# Patient Record
Sex: Male | Born: 1935 | Race: White | Hispanic: No | Marital: Married | Smoking: Former smoker
Health system: Southern US, Community
[De-identification: ages and names within clinical notes are randomized; demographics above are authoritative.]

## PROBLEM LIST (undated history)

## (undated) DIAGNOSIS — R972 Elevated prostate specific antigen [PSA]: Secondary | ICD-10-CM

## (undated) DIAGNOSIS — K219 Gastro-esophageal reflux disease without esophagitis: Secondary | ICD-10-CM

## (undated) DIAGNOSIS — J939 Pneumothorax, unspecified: Secondary | ICD-10-CM

## (undated) DIAGNOSIS — IMO0001 Reserved for inherently not codable concepts without codable children: Secondary | ICD-10-CM

## (undated) DIAGNOSIS — I10 Essential (primary) hypertension: Secondary | ICD-10-CM

## (undated) DIAGNOSIS — R55 Syncope and collapse: Secondary | ICD-10-CM

## (undated) DIAGNOSIS — N400 Enlarged prostate without lower urinary tract symptoms: Secondary | ICD-10-CM

## (undated) DIAGNOSIS — E785 Hyperlipidemia, unspecified: Secondary | ICD-10-CM

## (undated) DIAGNOSIS — K635 Polyp of colon: Secondary | ICD-10-CM

## (undated) DIAGNOSIS — Z950 Presence of cardiac pacemaker: Secondary | ICD-10-CM

## (undated) DIAGNOSIS — I2510 Atherosclerotic heart disease of native coronary artery without angina pectoris: Secondary | ICD-10-CM

## (undated) DIAGNOSIS — H3320 Serous retinal detachment, unspecified eye: Secondary | ICD-10-CM

## (undated) HISTORY — DX: Gastro-esophageal reflux disease without esophagitis: K21.9

## (undated) HISTORY — PX: CARDIAC SURGERY: SHX584

## (undated) HISTORY — DX: Elevated prostate specific antigen (PSA): R97.20

## (undated) HISTORY — DX: Benign prostatic hyperplasia without lower urinary tract symptoms: N40.0

## (undated) HISTORY — DX: Reserved for inherently not codable concepts without codable children: IMO0001

## (undated) HISTORY — PX: HEMORROIDECTOMY: SUR656

## (undated) HISTORY — DX: Polyp of colon: K63.5

## (undated) HISTORY — DX: Serous retinal detachment, unspecified eye: H33.20

## (undated) HISTORY — DX: Hyperlipidemia, unspecified: E78.5

## (undated) HISTORY — DX: Pneumothorax, unspecified: J93.9

## (undated) HISTORY — DX: Syncope and collapse: R55

---

## 2001-11-19 ENCOUNTER — Ambulatory Visit (HOSPITAL_COMMUNITY): Admission: RE | Admit: 2001-11-19 | Discharge: 2001-11-19 | Payer: Self-pay | Admitting: Internal Medicine

## 2002-07-08 HISTORY — PX: EYE SURGERY: SHX253

## 2007-04-08 HISTORY — PX: BYPASS GRAFT: SHX909

## 2007-04-15 ENCOUNTER — Ambulatory Visit (HOSPITAL_COMMUNITY): Admission: RE | Admit: 2007-04-15 | Discharge: 2007-04-15 | Payer: Self-pay | Admitting: Family Medicine

## 2007-04-16 ENCOUNTER — Ambulatory Visit (HOSPITAL_COMMUNITY): Admission: RE | Admit: 2007-04-16 | Discharge: 2007-04-16 | Payer: Self-pay | Admitting: Cardiovascular Disease

## 2007-04-16 ENCOUNTER — Ambulatory Visit: Payer: Self-pay | Admitting: Cardiovascular Disease

## 2007-04-17 ENCOUNTER — Ambulatory Visit: Payer: Self-pay | Admitting: Cardiology

## 2007-04-17 ENCOUNTER — Inpatient Hospital Stay (HOSPITAL_BASED_OUTPATIENT_CLINIC_OR_DEPARTMENT_OTHER): Admission: RE | Admit: 2007-04-17 | Discharge: 2007-04-17 | Payer: Self-pay | Admitting: Cardiology

## 2007-04-17 ENCOUNTER — Encounter: Payer: Self-pay | Admitting: Surgery

## 2007-04-17 ENCOUNTER — Inpatient Hospital Stay (HOSPITAL_COMMUNITY): Admission: AD | Admit: 2007-04-17 | Discharge: 2007-04-25 | Payer: Self-pay | Admitting: Cardiology

## 2007-04-20 ENCOUNTER — Ambulatory Visit: Payer: Self-pay | Admitting: Surgery

## 2007-05-07 ENCOUNTER — Encounter: Admission: RE | Admit: 2007-05-07 | Discharge: 2007-05-07 | Payer: Self-pay | Admitting: Surgery

## 2007-05-07 ENCOUNTER — Ambulatory Visit: Payer: Self-pay | Admitting: Surgery

## 2007-05-15 ENCOUNTER — Ambulatory Visit: Payer: Self-pay | Admitting: Cardiovascular Disease

## 2007-06-15 ENCOUNTER — Encounter: Payer: Self-pay | Admitting: Emergency Medicine

## 2007-06-15 ENCOUNTER — Observation Stay (HOSPITAL_COMMUNITY): Admission: AD | Admit: 2007-06-15 | Discharge: 2007-06-16 | Payer: Self-pay | Admitting: Cardiology

## 2007-06-15 ENCOUNTER — Ambulatory Visit: Payer: Self-pay | Admitting: Cardiology

## 2007-10-14 HISTORY — PX: US ECHOCARDIOGRAPHY: HXRAD669

## 2010-07-08 HISTORY — PX: SKIN GRAFT: SHX250

## 2010-11-05 HISTORY — PX: NM MYOVIEW LTD: HXRAD82

## 2010-11-20 NOTE — Consult Note (Signed)
NAME:  Chad Martinez, Chad Martinez NO.:  [id number]   MEDICAL RECORD NO.:  [id number]          PATIENT TYPE:  INP   LOCATION:  2003                         FACILITY:  [facility]   PHYSICIAN:  Evelene Croon, M.D.     DATE OF BIRTH:  Mar 06, 1935   DATE OF CONSULTATION:  04/18/2007  DATE OF DISCHARGE:                                 CONSULTATION   TYPE OF CONSULT:  Cardiovascular surgery.   REFERRING PHYSICIAN:  Arturo Morton. Riley Kill, MD, FAC   REASON FOR CONSULTATION:  Severe 3-vessel coronary artery disease.   CLINICAL HISTORY:  I was asked by Dr. Bonnee Quin this gentleman for  consideration of coronary artery bypass graft surgery.  He is a 76-year-  old fairly healthy gentleman who reports a 30 to 40-year history of  having intermittent substernal chest pain that he has always felt was GI  in nature.  He belches a lot and the pain goes away.  It has been more  related to meals.  Over the past 1 to 2 months, he has been having pain  in his left upper back over the shoulder blade occurring with exertion.  He underwent a stress test on April 15, 2007, by Dr. Simone Curia and  developed chest pressure as well as electrocardiogram changes in the  inferolateral leads.  He subsequently underwent cardiac catheterization  yesterday which shows severe 3-vessel disease.  The LAD had 30 to 40%  proximal stenosis and a 70% mid-vessel stenosis.  There was a second  diagonal branch that had a 50% proximal stenosis.  The left circumflex  had 70% proximal stenosis and then complete occlusion with filling of 2  obtuse marginal branches by collaterals.  The right coronary artery  had  a 60% proximal and 80% distal stenosis at the take off of the posterior  descending branch.  There was about 80% stenosis in a posterolateral  branch.  Left ventricular ejection fraction was normal.  End-diastolic  pressure was 12.  There was no gradient across the aortic valve.   REVIEW OF SYSTEMS:  GENERAL:  He  denies any fever or chills.  He has had  no recent weight changes.  He is not fatigued.  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  He has had surgery on both eyes.  ENT:  Negative.  ENDOCRINE:  He denies diabetes and hypothyroidism.  CARDIOVASCULAR:  As  above.  He has had exertional pain in his left upper back as well as  some substernal chest pain which he has had for years.  He denies  shortness of breath.  He denies PND and orthopnea.  He has had no  peripheral edema.  Denies palpitations.  RESPIRATORY:  He denies cough  and sputum production.  GI:  He has had no nausea or vomiting.  Does  report reflux-type symptoms.  He has had no melena or bright red blood  per rectum.  GU:  He denies dysuria and hematuria.  He does have  enlarged prostate.  NEUROLOGIC:  He denies any focal weakness or  numbness.  He denies dizziness and syncope.  He has never had TIA or  stroke.  MUSCULOSKELETAL:  He does have arthritis.  HEMATOLOGIC:  He  denies any hit of bleeding disorders or easy bleeding.  PSYCHIATRIC:  Negative.   ALLERGIES:  NONE.   MEDICATIONS:  His only medication prior to admission was enalapril 10 mg  daily.   SOCIAL HISTORY:  He is married for 40 years and is retired.  He spends  the winter in [place] and drives a mobile home down there with his wife.  He has 4 children.  He dinks occasional alcohol and denies smoking.   FAMILY HISTORY:  His father died in his 71s from multiple sclerosis.  Mother died at age 31.  There is no known family history of coronary  artery disease.   PAST MEDICAL HISTORY:  1. Significant for hypertension.  2. 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  3. He has a history of benign prostatic hypertrophy.  He has had prior      prostate biopsy.  4. He has status post hemorrhoid surgery.   PHYSICAL EXAMINATION:  VITAL SIGNS:  His blood  pressure 138/76, pulse 74  and regular, respiratory rate 20 unlabored, his weight is 199 pounds and  his height is 5 feet 8 inches.  GENERAL:  He is a well-developed white male in no distress.  HEENT:  Shows to be normocephalic and atraumatic.  Pupils equal and  reactive to light and accommodation.  Extraocular muscles intact.  His  throat is clear.  NECK:  Shows normal carotid pulses bilaterally. There are no bruits.  There is no  adenopathy or thyromegaly.  CARDIAC:  Shows a regular rate and rhythm with normal S1 and S2.  There  is no murmur, rub or gallop.  LUNGS:  Clear.  ABDOMEN:  Shows active bowel sounds.  His abdomen is soft, mildly obese  and nontender.  There are no palpable masses or organomegaly.  EXTREMITIES:  Shows no peripheral edema.  Pedal pulses are palpated  bilaterally.  SKIN:  Warm and dry.  NEUROLOGIC:  Shows him to be alert and oriented times 3.  Motor and  sensory exam is grossly normal.   IMAGING:  Carotid Doppler examination shows no evidence of internal  carotid artery stenosis.  His upper extremity arterial Dopplers are  normal.   IMPRESSION:  Mr. Hamberger has severe 3-vessel coronary artery disease  with anginal symptoms and a positive stress test.  I agree that coronary  artery bypass graft surgery is the best treatment to prevent further  ischemia and infarction and to improve his symptoms.  I discussed the  operative procedure with the patient and his wife, including  alternatives, benefits and risks including but not limited to bleeding,  blood transfusion, infection, stroke, myocardial infarction, graft  failure, and death.  They understand and agree to proceed.  We will plan  to do this on Monday morning April 20, 2007.      Evelene Croon, M.D.  Electronically Signed     BB/MEDQ  D:  04/18/2007  T:  04/19/2007  Job:  [id number]   cc:   Arturo Morton. Riley Kill, MD, [facility]  Donna Bernard, M.D.

## 2010-11-20 NOTE — Discharge Summary (Signed)
NAME:  Chad Martinez, Chad Martinez                ACCOUNT NO.:  [id number]   MEDICAL RECORD NO.:  [id number]          PATIENT TYPE:  INP   LOCATION:  3733                         FACILITY:  [facility]   PHYSICIAN:  Peter C. Eden Emms, MD, FACCDATE OF BIRTH:  Feb 22, 1935   DATE OF ADMISSION:  06/15/2007  DATE OF DISCHARGE:  06/16/2007                               DISCHARGE SUMMARY   PROCEDURES PERFORMED:  None.   PRIMARY FINAL DISCHARGE DIAGNOSIS:  Chest/neck pain.   SECONDARY DIAGNOSES:  1. Status post aortocoronary bypass surgery on April 20, 2007, with      left internal mammary artery to left anterior descending, saphenous      vein graft to obtuse marginal-2/obtuse marginal-3, saphenous vein      graft to posterior descending artery/posterolateral-      1/posterolateral-2 ( secondary to native severe 3-vessel disease).  2. History of hypertension.  3. Benign prostatic hypertrophy status post prostate biopsy.  4. 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  5. Remote history of hemorrhoid surgery.  6. Dyslipidemia.  7. History of colonoscopy and colon polyps.  8. Family history of coronary artery disease.   TIME AT DISCHARGE:  Thirty-one minutes.   HOSPITAL COURSE:  Chad Martinez is a 76 year old male who had bypass  surgery on April 20, 2007, after developing atypical chest pain.  He  had left-sided neck and shoulder pain and there was concern for cardiac  etiology to his symptoms, so he was admitted for further evaluation and  treatment.   His cardiac enzymes were negative for MI.  His blood sugar was 110 and  then a repeat blood sugar that was nonfasting was 184.  Chad Martinez  states that he checks his blood sugar occasionally with his wife's  machine at home as she is a diabetic.  He is encouraged to eat a low  sugar diet and follow up with Dr. Gerda Diss.   On June 16, 2007, Chad Martinez was evaluated by Dr. Eden Emms.  He felt  that Chad Martinez could be  followed as an outpatient and no changes in  his medications were indicated.  Chad Martinez was considered stable for  discharge on June 16, 2007, with outpatient followup arranged.   DISCHARGE INSTRUCTIONS:  His activity level is to be increased per rehab  guidelines.  He is to stick to a diet that is low in sodium and low in  sugar and carbohydrates.  He is to follow up with Dr. Eden Emms in  [place] on April 10 at 10:30 and with Dr. Gerda Diss as well and call for  an appointment.   DISCHARGE MEDICATIONS:  1. Simvastatin 80 mg daily.  2. Metoprolol 25 mg b.i.d.  3. Aspirin 325 mg daily.      Theodore Demark, PA-C      Noralyn Pick. Eden Emms, MD, [facility]  Electronically Signed    RB/MEDQ  D:  06/16/2007  T:  06/16/2007  Job:  [id number]   cc:   Dr. Gerda Diss

## 2010-11-20 NOTE — Consult Note (Signed)
NAME:  Chad Martinez, Chad Martinez NO.:  [id number]   MEDICAL RECORD NO.:  [id number]          PATIENT TYPE:  OUT   LOCATION:  DFTL                          FACILITY:  [facility]   PHYSICIAN:  Peter C. Eden Emms, MD, FACCDATE OF BIRTH:  1935-03-18   DATE OF CONSULTATION:  DATE OF DISCHARGE:                                 CONSULTATION   HISTORY:  Chad Martinez is a delightful 76 year old patient referred by  Dr. Gerda Diss for chest pain and abnormal stress test.   The patient's coronary risk factors including positive family history,  and hypertension.   The patient has had chest pain on and off for 30 years, however, over  the past 2 months or so, he has been having more exercised-induced chest  pressure.  His chronic pressure has always been thought to be GI in  nature.  He burps a lot.  It is related to meals.  He continues to think  a lot of his pressure has to do with food, however, over the last 6 to 8  weeks he clearly has exertional pain in his chest that sounds like  angina.  He had a stress test done April 15, 2007, by Dr. Simone Curia.  The patient was able to exercise for 6 minutes and 53 seconds  on the standard Bruce protocol.  He had a millimeter-and-a-half of ST  segment depression in the inferolateral leads.  More interestingly he  had significant chest pressure which reproduced his clinical symptoms  during this stress test.   The patient also had somewhat of a hypertensive response during exercise  with a systolic pressure of 220 mm.   There was no nuclear imaging done with it.   I talked to the patient at length. I explained to him that his symptoms  were suspicious for angina.  The fact that they have changed in the last  6 to 8 weeks and the fact that his stress test was positive I would feel  more comfortable with a fairly urgent heart cath.  The patient  understands this.  The risks of catheterization including stroke, dye  reaction need for emergency  surgery, bleeding and need for transfusion  were discussed.  He and his wife were willing to proceed.   The patient's past medical history is otherwise fairly benign.  He does  have a history hypertension.   He has not had previous surgery.  He has significant eye problems.  He  is essentially blind in the left eye from a detached retina and has had  the detached retina fixed in the right eye but otherwise has not really  been in the hospital.   ALLERGIES:  HE HAS NO KNOWN DRUG ALLERGIES.   MEDICATIONS:  He only takes enalapril 10 mg a day.   FAMILY HISTORY:  Remarkable for premature coronary artery disease on his  father's side.   He happily married for over 40 years.  They have 4 children.  His wife  was with him today.  He is retired from [employer].  He quit  smoking over 25  years ago.  He does not drink excessively.  He is active  in fact.  They had quit a lot of traveling planned to [facility] this  coming Sunday and then were going to spend 4 months in [place].   PHYSICAL EXAMINATION:  GENERAL:  Remarkable for a middle-aged white male  in no distress.  He has a tattoo on his left forearm.  VITAL SIGNS:  His blood pressure is 140/70, pulse 76 and regular, weight  is 199.  He is afebrile.  Respiratory rate 12.  HEENT:  Normal.  NECK:  Supple.  There is no thyromegaly, no lymphadenopathy.  No JVP  elevation. No bruits.  Lungs:  Clear with diaphragmatic motion.  No wheezing.  HEART:  There is an S1 and S2.  Normal heart sounds.  PMI is normal.  ABDOMEN:  Round.  Bowel sounds positive.  No hepatosplenomegaly.  No  hepatojugular reflux.  No AAA.  No bruit.  Femorals are 3+/3 bilaterally  without bruit.  PTs are +3.  There is no lower extremity edema.  NEUROLOGIC:  Nonfocal.  There is no muscular weakness.  SKIN:  Warm and dry.   Baseline EKG is normal.   IMPRESSION:  1. Exertional chest pain suspicious for angina, positive stress test,      left heart  catheterization with left ventriculogram by Dr. Excell Seltzer      tomorrow.  Continue aspirin therapy.  The patient has nitroglycerin      in case he needs it which was given to him by Dr. Gerda Diss.  2. Hypertension.  Continue enalapril.  May need more monitoring at      home given his hypertensive response to exercise. Adjuvant therapy      will depend on whether or not he has concomitant coronary disease.  3. The patient's LDL cholesterol is checked on April 14, 2007, was 41      with a total cholesterol of 147.  He will not need statin therapy      unless he has documented coronary disease.  4. Possible reflux and gastroesophageal reflux disease.  The patient      may benefit from a proton pump inhibitor particularly if his cath      shows no coronary artery disease.  Again we will have to wait to      see if his symptoms are related to angina.   The patient knows to go to the emergency room if he has any prolonged  episodes of pain.  Otherwise he will have his precath lab work done  today.   Note this was dictated on the hospital system so the [facility] would have  immediate record but it is an office visit at [facility] in [place].      Noralyn Pick. Eden Emms, MD, [facility]  Electronically Signed     PCN/MEDQ  D:  04/16/2007  T:  04/16/2007  Job:  [id number]   cc:   Donna Bernard, M.D.

## 2010-11-20 NOTE — Op Note (Signed)
NAME:  Chad Martinez, FAIRCLOTH NO.:  [id number]   MEDICAL RECORD NO.:  [id number]          PATIENT TYPE:  INP   LOCATION:  2310                         FACILITY:  [facility]   PHYSICIAN:  Evelene Croon, M.D.     DATE OF BIRTH:  [birth year]   DATE OF PROCEDURE:  04/20/2007  DATE OF DISCHARGE:                               OPERATIVE REPORT   PREOPERATIVE DIAGNOSIS:  Severe three-vessel coronary disease.   POSTOPERATIVE DIAGNOSIS:  Severe three-vessel coronary disease.   OPERATIVE PROCEDURE:  Median sternotomy, extracorporeal circulation,  coronary bypass graft surgery x6 using a left internal mammary artery  graft to the left anterior descending coronary, with a saphenous vein  graft to the second and third obtuse marginal branches of left  circumflex coronary, and a sequential saphenous vein graft to the  posterior descending, first posterolateral, and second posterolateral  branches of the right coronary artery.  Endoscopic vein harvesting from  the right leg.   ATTENDING SURGEON:  Evelene Croon, M.D.   ASSISTANT:  Gershon Crane, PA-C.   ANESTHESIA:  General endotracheal.   CLINICAL HISTORY:  This patient is a 76 year old gentleman who has been  previous good health who recently presented with exertional pain in his  left upper back.  He underwent a stress test which showed development of  chest pressure as well as electrocardiogram changes of inferolateral  ischemia.  He subsequently underwent cardiac catheterization on April 17, 2007 which showed severe three-vessel disease.  The LAD had 30-40%  proximal stenosis and a 70% midvessel stenosis.  There were two diagonal  branches that had 40-50% proximal stenosis.  The left circumflex had 70%  proximal stenosis and then complete occlusion with filling of 2 obtuse  marginal branches by collaterals.  The right coronary had 60% proximal  and 80% distal stenosis at the takeoff of the posterior descending  branch.  There was  about 80% stenosis in a posterolateral branch.  Left  ventricular ejection fraction was normal.  There was no gradient across  the aortic valve and no mitral regurgitation.  After review of the  angiogram and examination of the patient, it was felt that coronary  bypass graft surgery was the best treatment to prevent further ischemia  and infarction.  I discussed the operative procedure with the patient  and his wife including alternatives, benefits, and risks including but  not limited to bleeding, blood transfusion, infection, stroke,  myocardial infarction, graft failure, and death.  He understood and  agreed to proceed.   OPERATIVE PROCEDURE:  The patient was taken to the operating room and  placed on the table in supine position.  After induction of general  endotracheal anesthesia, a Foley catheter was placed in the bladder  using sterile technique.  Then, the chest, abdomen and both lower  extremities were prepped and draped in usual sterile manner.  The chest  was entered through a median sternotomy incision and the pericardium  opened in the midline.  Examination of the heart showed good ventricular  contractility.  The ascending aorta had no palpable plaques in it.  Then, the left internal mammary artery was harvested from the chest wall  as a pedicle graft.  This was a medium caliber vessel with excellent  blood pressure.  At the same time, a segment of greater saphenous vein  was harvested from the right leg using endoscopic vein harvest  technique.  This vein was of medium size and good quality.   Then, the patient was heparinized, and when an adequate activated  clotting time was achieved, the distal ascending aorta was cannulated  using a 20-French aortic cannula for arterial inflow.  Venous outflow  was achieved using a two-stage venous cannula through the right atrial  appendage.  Antegrade cardioplegia and vent cannula was inserted in the  aortic root.   The  patient was placed on cardiopulmonary bypass and distal coronary  arteries identified.  The LAD was a large graftable vessel.  There were  2 diagonal branches which had no significant distal disease in them.  There was some palpable plaque proximally.  The first marginal was small  to medium size vessel that no visible disease in it.  The second  marginal was small but graftable.  The third marginal was slightly  larger and graftable.  The right coronary artery was diffusely diseased.  There was a moderate size posterior descending, first posterolateral,  and second posterolateral branches.   Then, the aorta was crossclamped, and 500 mL of cold blood antegrade  cardioplegia was administered in the aortic root with quick arrest of  the heart.  Systemic hypothermia to 28 degrees centigrade and topical  hypothermic iced saline was used.  Temperature probe was placed in the  septum and insulating pad in the pericardium.   Then, the first distal anastomosis was performed to the posterior  descending coronary.  The internal diameter was 1.6 mm.  Conduit used  was a segment of greater saphenous vein and the anastomosis performed in  a sequential side-to-side manner using continuous 7-0 Prolene suture.  Flow was noted through the graft and was excellent.   The second distal anastomosis was performed of the first posterolateral  branch.  The internal diameter was 1.6 mm.  Conduit used was the same  segment of greater saphenous vein, and the anastomosis performed in a  sequential side-to-side manner using continuous 7-0 Prolene suture.  Flow was noted through the graft and was excellent.   The third distal anastomosis was performed to the second posterolateral  branch.  The internal diameter was also about 1.6 mm.  Conduit used was  the same segment of greater saphenous vein, and the anastomosis  performed in a sequential end-to-side manner using continuous 7-0  Prolene suture.  Then, another  dose of cardioplegia was given down vein  graft and in the aortic root.   The fourth distal anastomosis was performed to the second marginal  branch.  The internal diameter was 1.5 mm.  Conduit used was a second  segment of greater saphenous vein, and the anastomosis performed in a  sequential side-to-side manner using continuous 7-0 Prolene suture.  Flow was noted through the graft and was excellent.   The fifth distal anastomosis was performed to the third marginal branch.  The internal diameter was about 1.6 mm.  The conduit used was the same  segment of greater saphenous vein, and the anastomosis performed in a  sequential end-to-side manner using continuous 7-0 Prolene suture.  Flow  was noted through the graft and was excellent.  Then, another dose of  cardioplegia was  given down vein grafts and aortic root.   The sixth distal anastomosis performed in the distal LAD.  The internal  diameter of this vessel was about 2 mm.  Conduit used was a left  internal mammary graft, and this was brought through an opening in the  left pericardium anterior to the phrenic nerve.  It was anastomosed to  the LAD in end-to-side manner using continuous 8-0 Prolene suture.  The  pedicle was sutured to the epicardium with 6-0 Prolene sutures.  The  patient was rewarmed to 37 degrees centigrade.  Then, with a crossclamp  in place, the two proximal vein graft anastomoses were performed to the  aortic root in an end-to-side manner using continuous 6-0 Prolene  suture.  The crossclamp was then removed with a time of 93 minutes.  The  patient was rewarmed to 37 degrees centigrade.  The proximal and distal  anastomoses appeared hemostatic and lay of the grafts satisfactory.  Graft markers were placed around the proximal anastomoses.  Two  temporary right ventricular and right atrial pacing wires were placed  and brought out through the skin.   When the patient rewarmed to 37 degrees centigrade, he was  weaned from  cardiopulmonary bypass on no inotropic agents.  Total bypass time was  107 minutes.  Cardiac function appeared excellent at cardiac output of 5  liters minute.  Protamine was then given and the venous, and aortic  cannulas were removed without difficulty.  Hemostasis was achieved.  Four chest tubes were placed with bilateral pleural tubes, a tube in the  posterior pericardium and one in the anterior mediastinum.  The  pericardium was loosely reapproximated over the heart.  Sternum was  closed #6 stainless steel wires.  Fascia was closed with continuous #1  Vicryl suture.  Subcutaneous tissue was closed with continuous 2-0  Vicryl and the skin with 3-0 Vicryl subcuticular closure.  The lower  extremity vein harvest site was closed in layers in a similar manner.  The sponge, needle and instrument counts were correct according to scrub  nurse.  Dry sterile dressings were applied over the incisions and around  the chest tubes which were hooked to Pleur-Evac suction.  The patient  remained hemodynamically stable and was transferred to the SICU in  guarded but stable condition.      Evelene Croon, M.D.  Electronically Signed     BB/MEDQ  D:  04/20/2007  T:  04/21/2007  Job:  [id number]   cc:   Evelene Croon, M.D.  Arturo Morton. Riley Kill, MD, [facility]  [facility] Cardiac Catheterization [facility]

## 2010-11-20 NOTE — H&P (Signed)
NAME:  Chad Martinez, Chad Martinez NO.:  [id number]   MEDICAL RECORD NO.:  [id number]          PATIENT TYPE:  INP   LOCATION:  3733                         FACILITY:  [facility]   PHYSICIAN:  Chad Frieze. Jens Som, MD, FACCDATE OF BIRTH:  1935/03/20   DATE OF ADMISSION:  06/15/2007  DATE OF DISCHARGE:                              HISTORY & PHYSICAL   PRIMARY CARE PHYSICIAN:  Dr. Lilyan Martinez.  Cardiologist, Dr. Charlton Martinez.   CHIEF COMPLAINT:  Chest pain.   HISTORY OF PRESENT ILLNESS:  Chad Martinez is a 76 year old male with  known history of coronary artery disease.  He had bypass surgery on  April 20, 2007.  On June 13, 2007, he developed 2/10 neck pain.Marland Kitchen  His symptoms did not resolve, although they would improve with Tylenol  and he went to [facility] today.  His anginal symptoms had been  atypical with back pain and there was concern for an anginal etiology to  his pain, so he was sent to [facility] and transferred from  there to [facility].  He states his previous angina was a subscapular  back pain and this is a little bit higher.  He feels like his symptoms  are responsive to Tylenol.  He has not had chest pain.  He feels that he  is recovering well from his surgery.  He has some dyspnea on exertion  since the surgery, but he is ambulating and this is improving.  He has  not had exertional symptoms.  Currently, he is resting comfortably.   PAST MEDICAL HISTORY:  1. Status post cardiac catheterization April 17, 2007, showing      heavily calcified mid LAD, circumflex totaled and high grade RCA      lesion with a preserved EF.  2. Status post aortocoronary bypass surgery on April 20, 2007, with      LIMA to LAD, SVG to OM-2 and OM-3, SVG to PDA, PL-1 and PL-2.  3. Hypertension.  4. History of BPH, status post prostate biopsy.  5. History of detached retinas bilaterally, significantly decreased      vision on the left.  6. History of hemorrhoid  surgery.  7. Dyslipidemia with a total cholesterol of 136, triglycerides 142,      HDL 26, LDL 82 in October 6440.  8. History of colonoscopy and polyps with biopsy.  9. Family history of coronary artery disease.   SURGICAL HISTORY:  1. Cardiac catheterization.  2. Bypass surgery.  3. Colonoscopy.  4. Prostate biopsy.  5. Hemorrhoid surgery.   ALLERGIES:  No known drug allergies.   CURRENT MEDICATIONS:  1. Simvastatin 80 mg a day.  2. Metoprolol 25 mg b.i.d.  3. Aspirin 325 mg b.i.d.   SOCIAL HISTORY:  He lives in [place] with his wife and is retired  from [employer].  He quit tobacco 25 years ago with  approximately 20 pack-year history and has never abused alcohol or  drugs.  He feels that he has been trying to increase his activity since  the surgery and doing pretty well with this.  FAMILY HISTORY:  His father died of an MI at age 89.  His mother died of  pneumonia at age 33.  He has one brother who died of lung cancer and one  sister who is healthy and neither one of his siblings had coronary  artery disease.   REVIEW OF SYSTEMS:  He has no hematemesis, hemoptysis or melena.  He  denies any GI symptoms.  He has had other arthralgias in the past, but  none recently.  He denies chest pain, shortness of breath, dyspnea on  exertion, orthopnea, PND, edema or palpitations.  He has had no syncope  or presyncope.  He has had no fevers or chills or sweats.  Full 14-point  review of systems is otherwise negative.   PHYSICAL EXAMINATION:  VITAL SIGNS:  Blood pressure 139/86, temperature  96.7, pulse 69, respiratory 20, O2 saturation 99% on room air.  GENERAL:  He is a well-developed, well-nourished, white male in no acute  distress.  HEENT::  Normal.  NECK:  There is no JVD no lymphadenopathy, no thyromegaly and no carotid  bruits are noted.  CARDIAC:     Heart is regular in rate and rhythm with an S1, S2, but no  significant murmur, rub or gallop is noted.   Distal pulses are intact in  all four extremities.  LUNGS:  Clear to auscultation bilaterally.  SKIN:  No rashes or lesions are noted and surgical scars are well-  healed.  ABDOMEN:  Soft and nontender with active bowel sounds.  EXTREMITIES:  There is no cyanosis, clubbing or edema noted.  MUSCULOSKELETAL:  There is no joint deformity, effusions and no spine or  CVA tenderness.  NEUROLOGIC:  He is alert and oriented.  Cranial nerves 2-12 grossly  intact.   LABORATORY DATA AND X-RAY FINDINGS:  Chest x-ray shows no acute disease  (performed at [facility]).  EKG is sinus rhythm with no acute ischemic  changes.  He has ST wave flattening in the inferior leads.  An old EKG  is pending at the time of dictation.   Hemoglobin 14.3, hematocrit 42.  Sodium 142, potassium 4.1, chloride  107, BUN 13, creatinine 1.1, glucose 110.  Point of care markers  negative x1.   IMPRESSION/PLAN:  Chad Martinez is a 76 year old male with recent bypass  surgery as well as a history of hypertension and benign prostatic  hypertrophy who has had left neck pain that is intermittent.  It is not  pleuritic and there are no associated symptoms.  It is not positional,  but improved with Tylenol and possibly with walking.  He was seen at  [facility] and transferred to [facility].  His EKG is sinus rhythm with  no acute ST changes and  initial enzymes are negative.  His symptoms are most likely  musculoskeletal.  He will be admitted to the hospital and MI will be  ruled out.  If his cardiac enzymes remain negative, he can be discharged  in the a.m. and follow up with Dr. Eden Martinez in the office.  He will be  continued on his home medications including aspirin, statin and  Lopressor.      Chad Demark, PA-C      Chad Frieze. Jens Som, MD, [facility]  Electronically Signed    Chad Martinez  D:  06/15/2007  T:  06/16/2007  Job:  [id number]   cc:   Chad Picket A. Gerda Diss, MD

## 2010-11-20 NOTE — Discharge Summary (Signed)
NAME:  Chad, Martinez NO.:  [id number]   MEDICAL RECORD NO.:  [id number]          PATIENT TYPE:  INP   LOCATION:  2006                         FACILITY:  [facility]   PHYSICIAN:  Evelene Croon, M.D.     DATE OF BIRTH:  07/24/1934   DATE OF ADMISSION:  04/17/2007  DATE OF DISCHARGE:  04/25/2007                               DISCHARGE SUMMARY   FINAL DIAGNOSIS:  Severe three vessel coronary artery disease.   IN-HOSPITAL DIAGNOSES:  1. Volume overload postoperatively.  2. Acute blood loss anemia postoperatively.   SECONDARY DIAGNOSES:  1. Hypertension.  2. 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  3. History of benign prostatic hypertrophy, status post prostate      biopsy.  4. Status post hemorrhoid surgery.   IN-HOSPITAL OPERATIONS AND PROCEDURES:  1. Cardiac catheterization.  2. Coronary artery bypass grafting x6 using a left internal mammary      artery to left anterior descending coronary, saphenous vein graft      to second and third obtuse marginal branches, sequential saphenous      vein graft to posterior descending, first posterolateral and second      posterolateral branches.  Endoscopic vein harvesting from the right      leg was done.   HISTORY AND PHYSICAL AND HOSPITAL COURSE:  The patient is a 76 year old  gentleman who has been previously in good health who recently presented  with exertional pain in his left upper back.  He underwent a stress test  which showed development of chest pressure as well as electrocardiogram  changes of inferolateral ischemia.  He subsequently underwent cardiac  catheterization on April 17, 2007, which showed severe three-vessel  coronary artery disease.  Left ventricular ejection fraction was within  normal limits.  Following catheterization Dr. Laneta Simmers was consulted.  Dr.  Laneta Simmers saw and evaluated the patient.  He discussed the patient  undergoing coronary artery bypass grafting.  Risks and  benefits were  discussed.  The patient acknowledged understanding and agreed to  proceed.  Surgery was scheduled for April 20, 2007.  Preoperatively,  the patient underwent bilateral carotid Duplex ultrasound showing no  significant ICA stenosis.  The patient remained stable preoperatively.  For details of the patient's past medical history and physical exam,  please see dictated H&P.   The patient was taken to the operating room on April 20, 2007, where  he underwent coronary artery bypass grafting x6 using a left internal  mammary artery graft to left anterior descending, saphenous vein graft  to second and third obtuse marginal branches, sequential saphenous vein  graft to posterior descending, first posterolateral and second  posterolateral branches.  Endoscopic vein harvesting from right leg was  done.  The patient tolerated this procedure well and was transferred to  the intensive care unit in stable condition.  Postoperatively, the  patient was noted to be hemodynamically stable.  He was extubated the  evening of surgery.  Post extubation, the patient noted to be alert and  oriented x4 and neurologically intact.  The patient's postoperative  course was pretty much unremarkable.  While in the intensive care unit,  vital signs were followed.  He was able to be weaned from all drips.  Swan-Ganz catheter discontinued in normal fashion.  Chest x-ray on  postop day #1 was stable.  He had minimum drainage from chest tubes and  chest tubes discontinued in normal fashion.  The patient was out of bed  ambulating well with cardiac rehab.  He was tolerating diet well.  He  did have slight volume overload and was started on diuretics.  The  patient also had mild acute blood loss anemia.  Hemoglobin and  hematocrit dropping to 8.9 and 25.5% postop day #2.  The patient was  asymptomatic.  The patient was stable and able to be transferred out to  2000 on postop day #2.  While on telemetry  floor, the patient continued  to progress well.  His vital signs remained stable.  He remained  afebrile.  He was able to be weaned off oxygen, sating greater than 90%  on room air.  Daily weights were obtained and the patient's weight was  back near baseline prior to discharge.  He was continued on diuretics  while in the hospital.  The patient remained in normal sinus rhythm  postoperatively.  His pulmonary status continued to improve.  He  continue to use his incentive spirometer.  His incisions were clean, dry  and intact and healing well.  The patient continued to progress with  ambulation.  He is tolerating diet, no nausea or vomiting noted.  External pacing wires and chest tubes insertions discontinued in normal  fashion.   The patient is tentatively ready for discharge home in the a.m. postop  day #5, April 25, 2007, pending he remains stable.   FOLLOW-UP APPOINTMENTS:  1. Follow-up appointment has been arranged with Dr. Laneta Simmers for May 05, 2007, a 2:45 p.m.  The patient will need to obtain PA and      lateral chest x-ray 30 minutes prior to this appointment.  2. The patient will need to follow up with Dr. Riley Kill in 2 weeks.  He      needs to contact Dr. Rosalyn Charters office to make this appointment.   ACTIVITY:  Patient is instructed no driving until released to do so, no  heavy lifting over 10 pounds.  He is told to ambulate 3-4 times per day,  progress as tolerated and to continue his breathing exercises.   INCISIONAL CARE:  The patient is told to shower washing his incisions  using soap and water.  He is to contact the office if he develops any  drainage or opening from any of his incision sites.   DIET:  The patient educated on diet to be low-fat, low-salt.   DISCHARGE MEDICATIONS:  1. Aspirin 325 mg daily.  2. Lopressor 25 mg b.i.d.  3. Lipitor 40 mg at night.  4. Lasix 40 mg daily x5 days.  5. Potassium chloride 20 mEq daily x5 days.  6. Oxycodone 5 mg 1-2  tablets q.4-6h. p.r.n. pain.      Chad Martinez, [name]      Evelene Croon, M.D.  Electronically Signed    KMD/MEDQ  D:  04/24/2007  T:  04/26/2007  Job:  [id number]

## 2010-11-20 NOTE — Procedures (Signed)
NAME:  Chad Martinez, Chad Martinez NO.:  [id number]   MEDICAL RECORD NO.:  [id number]          PATIENT TYPE:  OUT   LOCATION:  DFTL                          FACILITY:  [facility]   PHYSICIAN:  Donna Bernard, M.D.DATE OF BIRTH:  12/13/1934   DATE OF PROCEDURE:  04/15/2007  DATE OF DISCHARGE:                                  STRESS TEST   DATE OF TEST:  April 15, 2007.   INDICATION FOR TEST:  This patient is a 76 year old white male with a  history of multiple risk factors who presented to the office several  days ago with many concerns.  One of them was chest discomfort  substernal which usually kicks in while exercising after meals.  The  patient often has some burping and does have history of reflux.  However, does not get similar pain at any other time other than when  exercising.  Describes it as substernal pressure, no nausea, shortness  of breath, diaphoresis.   A stress test was performed in a standard Bruce protocol, resting EKG  revealed normal sinus rhythm, no significant ST-T changes.  The patient  tolerated the first stage well.  During the second stage his ST segments  dropped out markedly.  By the start of the third stage the patient was  experiencing substernal tightness.  He had a hypertensive response as  expected with systolic as high as 220.  His ST segments in II, III, and  aVF dropped out at greater than 1.5 mm.  Dropped with a nearly flat  slope.  There were also some changes in anterolateral leads.   IMPRESSION:  Positive stress test stopped on the basis of symptoms and  significant ST segment depression.  Of note during rest the segments  eventually returned to baseline.   PLAN:  Urgent cardiology consult, nitroglycerin prescribed, rationale  discussed with patient.      Donna Bernard, M.D.  Electronically Signed     WSL/MEDQ  D:  04/15/2007  T:  04/16/2007  Job:  [id number]

## 2010-11-20 NOTE — Assessment & Plan Note (Signed)
[facility]                       [place] CARDIOLOGY OFFICE NOTE   NAME:Chad Martinez, Chad Martinez                       MRN:          [id number]  DATE:05/15/2007                            DOB:          31-Dec-1934    The patient returns today for follow-up.  He is status post coronary  artery bypass graft surgery at [facility].  The patient had new  onset chest pain and heart catheterization showed three-vessel disease  with good LV function and he subsequently had bypass by Evelene Croon,  M.D.  He was discharged on October 18.  His postoperative course is  fairly unremarkable.  He has been doing well.  He is about to leave to  go to [place] for four months.   He had multiple questions.  He has seen Dr. Laneta Simmers and cleared to drive.  He was interested in generic drugs since he does not have a plan.  I  told him I would be happy to switch him from Lipitor to simvastatin as  well as generic Lopressor.  He is on an aspirin a day and no other  medications.   In general, he has been doing well.  He has not had a fever.  His  sternum is healed well.  He has a small eschar on the right leg where he  had endoscopic vein harvest site which is the last and slowest thing to  heal.   He has otherwise been active.  He has been doing aerobic activity 45  minutes a day.  We can probably get him enrolled in cardiac rehab when  he comes back from [place].  Copies of his CABG report, discharge  summary, and EKG were given.   I also told him it would be fine to have sexual intercourse with his  wife.   He has no limitations outside of mowing the lawn or other activities  that would put a lot of stress on his sternum.   REVIEW OF SYSTEMS:  Otherwise negative.   MEDICATIONS:  1. Simvastatin 80 mg a day.  2. Aspirin one a day.  3. Lopressor 25 mg b.i.d.   PHYSICAL EXAMINATION:  GENERAL:  Health appearing middle age white male  in no distress.  Affect is  appropriate.  VITAL SIGNS:  Weight is 198, blood pressure 130/70, pulse 83 and  regular, afebrile, respiratory rate 14.  HEENT:  Unremarkable.  NECK:  Supple.  LUNGS:  Clear.  Good diaphragmatic motion.  No evidence of effusion.  Sternum is well-healed.  HEART:  S1 and S2 with normal heart sounds.  PMI is normal.  ABDOMEN:  Benign.  Bowel sounds positive.  No hepatosplenomegaly.  No  hepatojugular reflux.  No tenderness.  EXTREMITIES:  Endoscopic vein harvest site on the right side has a small  eschar, but no erythema.  The left side had no harvesting.  NEUROLOGY:  Nonfocal, no muscular weakness.   EKG shows sinus rhythm with nonspecific ST-T wave changes.   IMPRESSION:  1. Stable, status post coronary artery bypass graft.  Good left      ventricular function.  Continue  aspirin and beta blocker.  2. Hyperlipidemia.  LDL cholesterol was only 88, but in light of his      bypass, I prefer to keep him on Simvastatin 80 mg a day.  3. Eschar on the right lower extremity.  He will watch this.  He will      go into a pool or any public body of water while in [place] for 2-4      weeks.  He will notify his local doctor if he has any erythema.      Currently there is no indication for antibiotics.  4. Hypertension, currently well controlled.  Continue low dose beta      blocker.  Previously on enalapril.  No need for this at this point      in time.  Overall, he has made a good recovery and I will see him      when he comes back from [place].     Noralyn Pick. Eden Emms, MD, [employer] [facility]  Electronically Signed    PCN/MedQ  DD: 05/15/2007  DT: 05/16/2007  Job #: [id number]

## 2010-11-20 NOTE — Assessment & Plan Note (Signed)
OFFICE VISIT   Chad Martinez, Chad Martinez  DOB:  07/07/1935                                        May 07, 2007  CHART #:  [id number]   Chad Martinez returned today for follow up status post coronary artery  bypass graft surgery x6 on 04/20/2007. He has been feeling well overall  and is walking daily without chest pain or shortness of breath. He is  anxious to drive his motor home to [place] for the winter.   PHYSICAL EXAMINATION:  VITAL SIGNS:  Blood pressure 114/70, pulse 88 and  regular. Respiratory rate is 16 and non-labored. Oxygen saturation on  room air is 98%.  GENERAL:  He looks well.  CARDIAC:  Regular rate and rhythm with normal heart sounds.  LUNGS:  Clear.  CHEST:  Chest incision in healing well and the sternum is stable.  EXTREMITIES:  His leg incision is healing well and there is no lower  extremity edema.   His chest x-ray shows clear lung fields and plural effusions.   MEDICATIONS:  1. Aspirin 325 mg daily.  2. Lopressor 25 mg b.i.d.  3. Lipitor 40 mg nightly.  4. Tylenol for pain.   IMPRESSION:  Overall, Chad Martinez is recovering very well following his  surgery. I encouraged him to continue walking as much as possible. I  told him that he could return to driving a car at this time, but should  refrain from lifting anything heavier than 10 pounds for a  total of 3 months since his surgery. He will continue to follow up with  Dr. Riley Kill and will contact me if he develops any problems with his  incisions.   Evelene Croon, M.D.  Electronically Signed   BB/MEDQ  D:  05/07/2007  T:  05/08/2007  Job:  [id number]

## 2010-11-20 NOTE — Cardiovascular Report (Signed)
NAME:  Chad Martinez, WANDLER NO.:  [id number]   MEDICAL RECORD NO.:  [id number]          PATIENT TYPE:  INP   LOCATION:  2003                         FACILITY:  [facility]   PHYSICIAN:  Arturo Morton. Riley Kill, MD, FACCDATE OF BIRTH:  [birth year]   DATE OF PROCEDURE:  04/17/2007  DATE OF DISCHARGE:                            CARDIAC CATHETERIZATION   INDICATIONS:  Mr. Kamel is a 76 year old gentleman who has had recent  onset of angina.  He was seen by Dr. Eden Emms after an abnormal stress  test by Dr. Gerda Diss.  Cardiac catheterization was recommended.   PROCEDURES:  1. Left heart catheterization.  2. Selective coronary arteriography.  3. Selective left ventriculography.  4. Subclavian angiography.   DESCRIPTION OF PROCEDURE:  The patient was brought to the  catheterization lab and prepped and draped in the usual fashion. Through  an anterior puncture, the femoral artery was easily entered.  A 4-French  sheath was placed.  Views of left and right coronary arteries were then  obtained in multiple angiographic projections.  Subclavian angiography  was performed. Central aortic and left ventricular pressures measured  with a pigtail. Ventriculography was then performed in the RAO  projection.  The patient tolerated the procedure without complication is  was taken to the holding area in satisfactory clinical condition.  I  reviewed the films with the patient and subsequently with the family as  well.  A CVTS consult was called.  He is to be admitted.   HEMODYNAMIC DATA.:  1. Central aortic pressure was 152/72.  2. Left ventricular pressure was 143/12.  3. No gradient on pullback across aortic valve.   ANGIOGRAPHIC DATA.:  1. Ventriculography was done in the RAO projection.  Overall systolic      function is preserved.  No segmental abnormalities or contractions      are identified.  2. The subclavian is widely patent.  3. The mid LAD is heavily calcified.  4. The left main is  free of critical disease.  5. The LAD demonstrates diffuse 70% narrowing with marked irregularity      and heavily calcified segment in the mid portion of the LAD after      the first two diagonals. The first diagonal has 40-50% narrowing      and the second diagonal probably 30-40% narrowing as well. The      distal LAD appears to be graftable.  6. The circumflex is totally occluded after a tiny second marginal.      The first marginal has minor luminal irregularity. Distally, there      are two large posterolateral branches, one that fills by retrograde      collaterals and the other one by antegrade collaterals. The vessel      is totally occluded.  7. The right coronary artery demonstrates diffuse 60% narrowing. At      the crux involving the posterior descending branch is an 80%      complex stenosis involving the takeoff of the PDA as well as the AV      right coronary itself. Beyond this, there is  70% narrowing and then      70-80% narrowing involving the first posterolateral branch which      may be a PDA itself.  The continuation portion is a very large      branch vessel that also is suitable for grafting.   CONCLUSION:  1. Well-preserved left ventricular function.  2. Patent internal mammary.  3. Total occlusion of the circumflex with large distal vessel.  4. High-grade complex disease in the right coronary artery.  5. Scattered disease of the mid left anterior descending.   PLAN:  1. The patient will be admitted.  2. Further evaluation by CVTS will be undertaken.  He will likely need      revascularizations surgery.      Arturo Morton. Riley Kill, MD, [facility]  Electronically Signed     TDS/MEDQ  D:  04/17/2007  T:  04/18/2007  Job:  [id number]   cc:   Donna Bernard, M.D.  Noralyn Pick. Eden Emms, MD, [facility]  CV Laboratory

## 2010-11-20 NOTE — Procedures (Signed)
NAME:  ARDIS, FULLWOOD NO.:  [id number]   MEDICAL RECORD NO.:  [id number]          PATIENT TYPE:  OUT   LOCATION:  RAD                           FACILITY:  [facility]   PHYSICIAN:  W. Stephen Luking, M.D.DATE OF BIRTH:  [birth year]   DATE OF PROCEDURE:  04/15/2007  DATE OF DISCHARGE:                                  STRESS TEST   INDICATION FOR TEST:  This patient is a 76 year old white male with  multiple risk factors who has somewhat atypical chest pain though  concerning for coronary ischemia of substernal discomfort which occurs  soon after eating but generally conjunctionally with exertion.   Stress test was performed at standard Bruce protocol.  Resting EKG  revealed normal sinus rhythm with no significant ST-T changes.  The  patient's blood pressure at baseline was stable 148/78 the.  First 3  minutes the patient had some progressive dyspnea with some depression of  the ST-segment.  By the completion of the third stage, the patient was  developing more significant ST depression.  At the start of the third  stage, the patient reached a heart rate of only 122.  However, at this  time he had the classic substernal pressure and discomfort associated  with dyspnea.  At this point, at 0.08 seconds past the J-point there was  a greater than 1 mm ST depression with near flattening in lead 3 and  near flattening in 2 and aVF.  There was also changes in anterolateral  leads of V5-V6, however with more of an ascending slope at 0.08 seconds  past a J-point   IMPRESSION:  Positive adequate stress test.   PLAN:  Urgent cardiology consultation rationale discussed with the  patient.      Donna Bernard, M.D.  Electronically Signed     WSL/MEDQ  D:  04/29/2007  T:  04/30/2007  Job:  [id number]

## 2010-11-23 NOTE — Op Note (Signed)
[facility]  Patient:    Chad Martinez, FLYNT Visit Number: [id number] MRN: [id number]          Service Type: END Location: DAY Attending Physician:  Jonathon Bellows Dictated by:   Roetta Sessions, M.D. Proc. Date: 11/19/01 Admit Date:  11/19/2001 Discharge Date: 11/19/2001   CC:         Loran Senters, M.D.   Operative Report  PROCEDURE:  Colonoscopy with biopsy.  INDICATIONS FOR PROCEDURE:  The patient is a 76 year old gentleman devoid of any lower GI tract symptoms referred through the courtesy of Dr. Lubertha South for colonoscopy as a screening maneuver. This approach has been discussed with Mr. Cortese. The potential risks, benefits, and alternatives have been reviewed, questions answered. Again, he has no lower GI tract symptoms. There is no family history of colorectal neoplasia and he has never had his lower GI tract imaged previously. Please see my handwritten H&P for more information.  MONITORING:  O2 saturations, blood pressure, pulse and respirations were monitored throughout the entire procedure.  CONSCIOUS SEDATION:  Versed 3mg  IV, Demerol 75 mg IV in divided doses.  INSTRUMENT:  Olympus video chip adult colonoscope.  FINDINGS:  Digital rectal examination revealed no abnormalities.  ENDOSCOPIC FINDINGS:  The prep was good.  RECTUM:  Examination of the rectal mucosa including of retroflexed view of the anal verge revealed a 5 mm sessile polyp at 3 cm in from the anal verge seen only on retroflexion. Please see photos. The remainder of the rectal mucosa appeared normal.  COLON:  The colonic mucosa was surveyed from the rectosigmoid junction through the left transverse right colon to the area of the appendiceal orifice, ileocecal valve and cecum. I attempted to photograph these areas but because the computer had "locked up" I was unable. These structures were well seen. Aside from scattered left sided diverticula, the colonic mucosa  appeared normal all the way to the cecum. From the level of the cecum and ileocecal valve, the scope was slowly withdrawn. All previously mentioned mucosal surfaces were again seen. No other abnormalities were observed.  The polyp in the rectum was cold biopsied/removed. The patient tolerated the procedure well and was reacted in endoscopy.  IMPRESSION:  1. Diminutive rectal polyp cold biopsied/removed, otherwise, normal rectum.  2. Scattered left sided diverticula. The remainder of the colonic mucosa     appeared normal.  RECOMMENDATIONS:  1. Diverticulosis literature given to Mr. Nelson. Follow-up on path.  2. Further recommendations to follow. Dictated by:   Roetta Sessions, M.D. Attending Physician:  Jonathon Bellows DD:  11/19/01 TD:  11/23/01 Job: [id number]

## 2011-04-15 LAB — I-STAT 8, (EC8 V) (CONVERTED LAB)
Bicarbonate: 25.1 — ABNORMAL HIGH
Operator id: 286941
Sodium: 142
pH, Ven: 7.402 — ABNORMAL HIGH

## 2011-04-15 LAB — POCT CARDIAC MARKERS
CKMB, poc: <1 — ABNORMAL LOW
Myoglobin, poc: 76.4

## 2011-04-15 LAB — COMPREHENSIVE METABOLIC PANEL
ALT: 19
Alkaline Phosphatase: 58
Creatinine, Ser: 1.12
GFR calc non Af Amer: >60
Glucose, Bld: 184 — ABNORMAL HIGH
Total Protein: 6.7

## 2011-04-15 LAB — CARDIAC PANEL(CRET KIN+CKTOT+MB+TROPI)
CK, MB: 1.5
Relative Index: INVALID
Total CK: 90
Troponin I: 0.02

## 2011-04-15 LAB — PROTIME-INR
INR: 1.1
Prothrombin Time: 14.9

## 2011-04-15 LAB — POCT I-STAT CREATININE
Creatinine, Ser: 1.1
Operator id: 286941

## 2011-04-17 LAB — BASIC METABOLIC PANEL
BUN: 15
BUN: 17
CO2: 27
Calcium: 7.6 — ABNORMAL LOW
Creatinine, Ser: 1.02
GFR calc Af Amer: >60
GFR calc non Af Amer: >60
Glucose, Bld: 113 — ABNORMAL HIGH
Potassium: 4.1
Potassium: 7.5
Sodium: 139
Sodium: 139

## 2011-04-17 LAB — CBC
MCV: 86.6
RDW: 13.2

## 2011-04-18 LAB — COMPREHENSIVE METABOLIC PANEL
ALT: 24
Albumin: 3.6
Alkaline Phosphatase: 49
GFR calc Af Amer: >60
Potassium: 4.1
Sodium: 142
Total Bilirubin: 1.1
Total Protein: 5.8 — ABNORMAL LOW

## 2011-04-18 LAB — POCT I-STAT 4, (NA,K, GLUC, HGB,HCT)
Glucose, Bld: 113 — ABNORMAL HIGH
Glucose, Bld: 114 — ABNORMAL HIGH
Glucose, Bld: 118 — ABNORMAL HIGH
Glucose, Bld: 132 — ABNORMAL HIGH
HCT: 22 — ABNORMAL LOW
HCT: 24 — ABNORMAL LOW
HCT: 29 — ABNORMAL LOW
Hemoglobin: 13.3
Hemoglobin: 7.5 — CL
Hemoglobin: 8.2 — ABNORMAL LOW
Operator id: 148471
Operator id: 3406
Potassium: 3.9
Potassium: 4.3
Potassium: 4.6
Potassium: 5.1
Sodium: 132 — ABNORMAL LOW
Sodium: 133 — ABNORMAL LOW

## 2011-04-18 LAB — TYPE AND SCREEN

## 2011-04-18 LAB — POCT I-STAT 3, ART BLOOD GAS (G3+)
Acid-base deficit: 3 — ABNORMAL HIGH
Bicarbonate: 22.8
O2 Saturation: 100
Operator id: 120211
Patient temperature: 34.6
TCO2: 24
TCO2: 24
pCO2 arterial: 38.1
pH, Arterial: 7.388
pH, Arterial: 7.388
pO2, Arterial: 165 — ABNORMAL HIGH

## 2011-04-18 LAB — CBC
HCT: 28.7 — ABNORMAL LOW
HCT: 31.5 — ABNORMAL LOW
HCT: 40
Hemoglobin: 10.1 — ABNORMAL LOW
Hemoglobin: 13.7
Hemoglobin: 14.6
Hemoglobin: 9 — ABNORMAL LOW
MCHC: 34.6
MCHC: 34.6
MCHC: 34.8
MCV: 85.9
MCV: 86.4
MCV: 86.5
MCV: 86.7
Platelets: 126 — ABNORMAL LOW
Platelets: 127 — ABNORMAL LOW
Platelets: 175
Platelets: 184
RBC: 3.02 — ABNORMAL LOW
RBC: 3.94 — ABNORMAL LOW
RDW: 12.6
RDW: 13
RDW: 13.2
WBC: 10.7 — ABNORMAL HIGH
WBC: 12.3 — ABNORMAL HIGH
WBC: 9.4

## 2011-04-18 LAB — I-STAT EC8
BUN: 16
Bicarbonate: 22.3
Chloride: 108
Glucose, Bld: 130 — ABNORMAL HIGH
Operator id: 120211
pCO2 arterial: 37.7
pH, Arterial: 7.379

## 2011-04-18 LAB — BASIC METABOLIC PANEL
CO2: 23
Chloride: 109
Creatinine, Ser: 0.93
GFR calc Af Amer: >60
GFR calc non Af Amer: 56 — ABNORMAL LOW
Glucose, Bld: 113 — ABNORMAL HIGH
Potassium: 4.3
Sodium: 139
Sodium: 142

## 2011-04-18 LAB — CREATININE, SERUM
Creatinine, Ser: 0.97
GFR calc Af Amer: >60
GFR calc non Af Amer: >60

## 2011-04-18 LAB — BLOOD GAS, ARTERIAL
O2 Saturation: 95.4
pO2, Arterial: 79.5 — ABNORMAL LOW

## 2011-04-18 LAB — POCT I-STAT GLUCOSE
Glucose, Bld: 139 — ABNORMAL HIGH
Operator id: 3406

## 2011-04-18 LAB — HEMOGLOBIN AND HEMATOCRIT, BLOOD: Hemoglobin: 9 — ABNORMAL LOW

## 2011-04-18 LAB — I-STAT 8, (EC8 V) (CONVERTED LAB)
Bicarbonate: 24.1 — ABNORMAL HIGH
Chloride: 105
Potassium: 3.9
Sodium: 141

## 2011-04-18 LAB — LIPID PANEL
Cholesterol: 136
LDL Cholesterol: 82
Triglycerides: 142
VLDL: 28

## 2011-04-18 LAB — TSH: TSH: 2.652

## 2011-04-18 LAB — MAGNESIUM
Magnesium: 2.4
Magnesium: 2.5

## 2011-04-18 LAB — HEMOGLOBIN A1C: Mean Plasma Glucose: 129

## 2011-10-09 DIAGNOSIS — E782 Mixed hyperlipidemia: Secondary | ICD-10-CM | POA: Diagnosis not present

## 2011-10-09 DIAGNOSIS — I2510 Atherosclerotic heart disease of native coronary artery without angina pectoris: Secondary | ICD-10-CM | POA: Diagnosis not present

## 2011-10-11 DIAGNOSIS — H35349 Macular cyst, hole, or pseudohole, unspecified eye: Secondary | ICD-10-CM | POA: Diagnosis not present

## 2011-10-11 DIAGNOSIS — H3320 Serous retinal detachment, unspecified eye: Secondary | ICD-10-CM | POA: Diagnosis not present

## 2011-10-11 DIAGNOSIS — H35379 Puckering of macula, unspecified eye: Secondary | ICD-10-CM | POA: Diagnosis not present

## 2011-10-11 HISTORY — DX: Serous retinal detachment, unspecified eye: H33.20

## 2011-10-22 DIAGNOSIS — Q5564 Hidden penis: Secondary | ICD-10-CM | POA: Diagnosis not present

## 2011-10-31 DIAGNOSIS — H3320 Serous retinal detachment, unspecified eye: Secondary | ICD-10-CM | POA: Diagnosis not present

## 2011-11-13 DIAGNOSIS — E785 Hyperlipidemia, unspecified: Secondary | ICD-10-CM | POA: Diagnosis not present

## 2011-11-13 DIAGNOSIS — Z125 Encounter for screening for malignant neoplasm of prostate: Secondary | ICD-10-CM | POA: Diagnosis not present

## 2011-11-13 DIAGNOSIS — Z79899 Other long term (current) drug therapy: Secondary | ICD-10-CM | POA: Diagnosis not present

## 2011-11-13 DIAGNOSIS — R7301 Impaired fasting glucose: Secondary | ICD-10-CM | POA: Diagnosis not present

## 2011-11-20 DIAGNOSIS — E119 Type 2 diabetes mellitus without complications: Secondary | ICD-10-CM | POA: Diagnosis not present

## 2011-11-20 DIAGNOSIS — I259 Chronic ischemic heart disease, unspecified: Secondary | ICD-10-CM | POA: Diagnosis not present

## 2011-11-20 DIAGNOSIS — I10 Essential (primary) hypertension: Secondary | ICD-10-CM | POA: Diagnosis not present

## 2011-12-10 ENCOUNTER — Emergency Department (HOSPITAL_COMMUNITY): Payer: Medicare Other

## 2011-12-10 ENCOUNTER — Inpatient Hospital Stay (HOSPITAL_COMMUNITY): Payer: Medicare Other

## 2011-12-10 ENCOUNTER — Encounter (HOSPITAL_COMMUNITY): Payer: Self-pay | Admitting: Cardiology

## 2011-12-10 ENCOUNTER — Inpatient Hospital Stay (HOSPITAL_COMMUNITY)
Admission: EM | Admit: 2011-12-10 | Discharge: 2011-12-13 | DRG: 982 | Disposition: A | Discharge status: Home / Self Care | Payer: Medicare Other | Attending: General Surgery | Admitting: General Surgery

## 2011-12-10 DIAGNOSIS — J939 Pneumothorax, unspecified: Secondary | ICD-10-CM

## 2011-12-10 DIAGNOSIS — R404 Transient alteration of awareness: Secondary | ICD-10-CM | POA: Diagnosis not present

## 2011-12-10 DIAGNOSIS — T148XXA Other injury of unspecified body region, initial encounter: Secondary | ICD-10-CM | POA: Diagnosis not present

## 2011-12-10 DIAGNOSIS — D649 Anemia, unspecified: Secondary | ICD-10-CM | POA: Diagnosis present

## 2011-12-10 DIAGNOSIS — R918 Other nonspecific abnormal finding of lung field: Secondary | ICD-10-CM | POA: Diagnosis not present

## 2011-12-10 DIAGNOSIS — T07XXXA Unspecified multiple injuries, initial encounter: Secondary | ICD-10-CM | POA: Diagnosis not present

## 2011-12-10 DIAGNOSIS — S270XXA Traumatic pneumothorax, initial encounter: Secondary | ICD-10-CM | POA: Diagnosis present

## 2011-12-10 DIAGNOSIS — S2249XA Multiple fractures of ribs, unspecified side, initial encounter for closed fracture: Principal | ICD-10-CM | POA: Diagnosis present

## 2011-12-10 DIAGNOSIS — Z951 Presence of aortocoronary bypass graft: Secondary | ICD-10-CM

## 2011-12-10 DIAGNOSIS — I10 Essential (primary) hypertension: Secondary | ICD-10-CM | POA: Diagnosis present

## 2011-12-10 DIAGNOSIS — S27329A Contusion of lung, unspecified, initial encounter: Secondary | ICD-10-CM

## 2011-12-10 DIAGNOSIS — S2239XA Fracture of one rib, unspecified side, initial encounter for closed fracture: Secondary | ICD-10-CM | POA: Diagnosis not present

## 2011-12-10 DIAGNOSIS — I2510 Atherosclerotic heart disease of native coronary artery without angina pectoris: Secondary | ICD-10-CM | POA: Diagnosis present

## 2011-12-10 DIAGNOSIS — S2241XA Multiple fractures of ribs, right side, initial encounter for closed fracture: Secondary | ICD-10-CM | POA: Diagnosis present

## 2011-12-10 DIAGNOSIS — R001 Bradycardia, unspecified: Secondary | ICD-10-CM | POA: Diagnosis not present

## 2011-12-10 DIAGNOSIS — E785 Hyperlipidemia, unspecified: Secondary | ICD-10-CM | POA: Diagnosis present

## 2011-12-10 DIAGNOSIS — J9383 Other pneumothorax: Secondary | ICD-10-CM | POA: Diagnosis not present

## 2011-12-10 DIAGNOSIS — M542 Cervicalgia: Secondary | ICD-10-CM | POA: Diagnosis not present

## 2011-12-10 DIAGNOSIS — T797XXA Traumatic subcutaneous emphysema, initial encounter: Secondary | ICD-10-CM | POA: Diagnosis not present

## 2011-12-10 DIAGNOSIS — I441 Atrioventricular block, second degree: Secondary | ICD-10-CM | POA: Diagnosis present

## 2011-12-10 DIAGNOSIS — Z4789 Encounter for other orthopedic aftercare: Secondary | ICD-10-CM | POA: Diagnosis not present

## 2011-12-10 DIAGNOSIS — R55 Syncope and collapse: Secondary | ICD-10-CM | POA: Diagnosis not present

## 2011-12-10 DIAGNOSIS — S060XAA Concussion with loss of consciousness status unknown, initial encounter: Secondary | ICD-10-CM

## 2011-12-10 DIAGNOSIS — I498 Other specified cardiac arrhythmias: Secondary | ICD-10-CM | POA: Diagnosis not present

## 2011-12-10 DIAGNOSIS — R079 Chest pain, unspecified: Secondary | ICD-10-CM | POA: Diagnosis not present

## 2011-12-10 DIAGNOSIS — S060X9A Concussion with loss of consciousness of unspecified duration, initial encounter: Secondary | ICD-10-CM

## 2011-12-10 DIAGNOSIS — IMO0002 Reserved for concepts with insufficient information to code with codable children: Secondary | ICD-10-CM

## 2011-12-10 DIAGNOSIS — Z87891 Personal history of nicotine dependence: Secondary | ICD-10-CM

## 2011-12-10 DIAGNOSIS — J984 Other disorders of lung: Secondary | ICD-10-CM | POA: Diagnosis not present

## 2011-12-10 DIAGNOSIS — W11XXXA Fall on and from ladder, initial encounter: Secondary | ICD-10-CM | POA: Diagnosis present

## 2011-12-10 HISTORY — DX: Essential (primary) hypertension: I10

## 2011-12-10 HISTORY — DX: Pneumothorax, unspecified: J93.9

## 2011-12-10 HISTORY — DX: Atherosclerotic heart disease of native coronary artery without angina pectoris: I25.10

## 2011-12-10 LAB — POCT I-STAT, CHEM 8
BUN: 31 mg/dL — ABNORMAL HIGH (ref 6–23)
Calcium, Ion: 1.19 mmol/L (ref 1.12–1.32)
Chloride: 105 mEq/L (ref 96–112)
Creatinine, Ser: 1.1 mg/dL (ref 0.50–1.35)
Glucose, Bld: 137 mg/dL — ABNORMAL HIGH (ref 70–99)

## 2011-12-10 LAB — CBC
HCT: 47.3 % (ref 39.0–52.0)
HCT: 44.5 % (ref 39.0–52.0)
MCHC: 34.7 g/dL (ref 30.0–36.0)
MCHC: 34.8 g/dL (ref 30.0–36.0)
MCV: 87.4 fL (ref 78.0–100.0)
RDW: 13.2 % (ref 11.5–15.5)
RDW: 13.1 % (ref 11.5–15.5)

## 2011-12-10 LAB — BASIC METABOLIC PANEL
BUN: 27 mg/dL — ABNORMAL HIGH (ref 6–23)
GFR calc Af Amer: >90 mL/min (ref >90)
GFR calc non Af Amer: 84 mL/min — ABNORMAL LOW (ref >90)
Potassium: 4.3 mEq/L (ref 3.5–5.1)

## 2011-12-10 LAB — DIFFERENTIAL
Basophils Absolute: 0.1 10*3/uL (ref 0.0–0.1)
Basophils Relative: 1 % (ref 0–1)
Eosinophils Relative: 2 % (ref 0–5)
Monocytes Absolute: 0.7 10*3/uL (ref 0.1–1.0)
Monocytes Relative: 7 % (ref 3–12)

## 2011-12-10 LAB — MRSA PCR SCREENING: MRSA by PCR: NEGATIVE

## 2011-12-10 LAB — CARDIAC PANEL(CRET KIN+CKTOT+MB+TROPI): Relative Index: 1.7 (ref 0.0–2.5)

## 2011-12-10 MED ORDER — DOCUSATE SODIUM 100 MG PO CAPS
100.0000 mg | ORAL_CAPSULE | Freq: Two times a day (BID) | ORAL | Status: DC
  Administered 2011-12-10 – 2011-12-13 (×5): 100 mg via ORAL
  Filled 2011-12-10 (×6): qty 1

## 2011-12-10 MED ORDER — ONDANSETRON HCL 4 MG PO TABS: 4.0000 mg | ORAL_TABLET | Freq: Four times a day (QID) | ORAL | Status: DC | PRN

## 2011-12-10 MED ORDER — IPRATROPIUM-ALBUTEROL 18-103 MCG/ACT IN AERO
2.0000 | INHALATION_SPRAY | Freq: Four times a day (QID) | RESPIRATORY_TRACT | Status: DC
  Administered 2011-12-11 (×3): 2 via RESPIRATORY_TRACT
  Filled 2011-12-10 (×3): qty 14.7

## 2011-12-10 MED ORDER — OXYCODONE HCL 5 MG PO TABS
5.0000 mg | ORAL_TABLET | ORAL | Status: DC | PRN
  Administered 2011-12-10 – 2011-12-12 (×2): 5 mg via ORAL
  Filled 2011-12-10: qty 1
  Filled 2011-12-10: qty 2
  Filled 2011-12-10 (×2): qty 1

## 2011-12-10 MED ORDER — HYDROMORPHONE HCL PF 1 MG/ML IJ SOLN: 0.5000 mg | INTRAMUSCULAR | Status: DC | PRN

## 2011-12-10 MED ORDER — METOPROLOL TARTRATE 25 MG PO TABS
25.0000 mg | ORAL_TABLET | Freq: Two times a day (BID) | ORAL | Status: DC
  Filled 2011-12-10: qty 1

## 2011-12-10 MED ORDER — DOPAMINE-DEXTROSE 3.2-5 MG/ML-% IV SOLN: 2.0000 ug/kg/min | INTRAVENOUS | Status: DC

## 2011-12-10 MED ORDER — ONDANSETRON HCL 4 MG/2ML IJ SOLN: 4.0000 mg | Freq: Four times a day (QID) | INTRAMUSCULAR | Status: DC | PRN

## 2011-12-10 MED ORDER — OXYCODONE HCL 5 MG PO TABS
10.0000 mg | ORAL_TABLET | ORAL | Status: DC | PRN
  Administered 2011-12-11 – 2011-12-12 (×3): 10 mg via ORAL
  Filled 2011-12-10: qty 2
  Filled 2011-12-10 (×2): qty 1

## 2011-12-10 MED ORDER — PANTOPRAZOLE SODIUM 40 MG PO TBEC
40.0000 mg | DELAYED_RELEASE_TABLET | Freq: Every day | ORAL | Status: DC
  Administered 2011-12-10 – 2011-12-12 (×3): 40 mg via ORAL
  Filled 2011-12-10 (×3): qty 1

## 2011-12-10 MED ORDER — MORPHINE SULFATE 2 MG/ML IJ SOLN
2.0000 mg | Freq: Once | INTRAMUSCULAR | Status: AC
  Administered 2011-12-10: 2 mg via INTRAVENOUS
  Filled 2011-12-10: qty 1

## 2011-12-10 MED ORDER — PANTOPRAZOLE SODIUM 40 MG IV SOLR
40.0000 mg | Freq: Every day | INTRAVENOUS | Status: DC
  Filled 2011-12-10 (×3): qty 40

## 2011-12-10 MED ORDER — TIZANIDINE HCL 4 MG PO TABS
4.0000 mg | ORAL_TABLET | Freq: Three times a day (TID) | ORAL | Status: DC | PRN
  Filled 2011-12-10: qty 1

## 2011-12-10 MED ORDER — ALUM & MAG HYDROXIDE-SIMETH 200-200-20 MG/5ML PO SUSP
30.0000 mL | ORAL | Status: DC | PRN
  Administered 2011-12-10: 30 mL via ORAL
  Filled 2011-12-10: qty 30

## 2011-12-10 MED ORDER — KCL IN DEXTROSE-NACL 20-5-0.45 MEQ/L-%-% IV SOLN
INTRAVENOUS | Status: DC
  Administered 2011-12-10: 22:00:00 via INTRAVENOUS
  Administered 2011-12-11: 75 mL via INTRAVENOUS
  Filled 2011-12-10 (×7): qty 1000

## 2011-12-10 NOTE — Progress Notes (Signed)
Pt transferred to 2300, report called to [place], Charity fundraiser.

## 2011-12-10 NOTE — Progress Notes (Signed)
12/10/11 1449  Discharge Planning  Type of Residence Private residence  Living Arrangements Spouse/significant other (of 51years)  Home Care Services No  Support Systems Spouse/significant other;Children  Do you have any problems obtaining your medications? No  Family/patient expects to be discharged to: Private residence  Once you are discharged, how will you get to your follow-up appointment? Family  Expected Discharge Date 12/13/11  Case Management Consult Needed No  Social Work Consult Needed No    Pt is pending step-down or ICU bed.  No obvious CM or CSW needs at this time. Pt's family at bedside, very supportive. Wife is upset that Pt was up on a ladder, but Pt seems to enjoy outside work and frequently uses ladders as needed. Pt retired 40yrs ago from [employer]. 4 adult children, 2 in the immediate area.  Frederico Hamman, LCSW [phone]

## 2011-12-10 NOTE — Progress Notes (Signed)
eLink Physician-Brief Progress Note Patient Name: Chad Martinez DOB: Feb 12, 1935 MRN: [id number]  Date of Service  12/10/2011   HPI/Events of Note   Called for unresponsiveness.  Pt now alert. S/p trauma. Larey Seat out of tree  eICU Interventions  Cycle enzymes, ecg, cxr, labs. tfr to ICU Primary team notified   Intervention Category Major Interventions: Code management / supervision  Shan Levans 12/10/2011, 4:07 PM

## 2011-12-10 NOTE — Code Documentation (Signed)
Code was called.  Patient was transferring to bed from gurney; when he sat up he became dizzy, diaphoretic and nausea before losing consciousness.  He woke spontaneously.  Did have urinary incontinence. Pulse present throughout episode.  Ordered stat ekg, cbc, cmp, cardiac enzymes.  Primary team was notified.  Patient stable.  BOOTH, Jalani Rominger 12/10/2011, 4:12 PM

## 2011-12-10 NOTE — ED Notes (Signed)
Xray at the bedside for portable chest

## 2011-12-10 NOTE — Progress Notes (Signed)
Responded to level 2  page to Ed to provide emotional support  to patient and wife.  Pt. fell from latter and suffered several injuries. Spoke with pt.and all pt wanted was wife to be by his side. Stayed with wife and friends until they were able to visit at bedside. Will follow as needed.   12/10/11 1200  Clinical Encounter Type  Visited With Patient and family together;Health care provider  Visit Type ED;Trauma  Spiritual Encounters  Spiritual Needs Emotional  Stress Factors  Patient Stress Factors None identified  Family Stress Factors None identified

## 2011-12-10 NOTE — Progress Notes (Addendum)
Patient ID: Chad Martinez, male   DOB: 1934-12-01, 77 y.o.   MRN: [id number] On arrival to 3300 patient had what appears to have been a vagal episode with significant bradycardia, nausea, and decreased MS.  A code was called but the patient responded quickly with HR 70's and hypertension.  Now he is oriented again but has no memory of the event.Lungs CTA, CV reg,  VSS now but has had two pauses of 2-3 seconds. EKG done, troponin < 0.03. I spoke with Arlys John from [facility] as patient sees Dr. Allyson Sabal.  We will plan transfer to ICU.  It is also possible an event such as this actually caused his fall.  Will check CXR & closely monitor.  I D/W patient and his wife. Violeta Gelinas, MD, MPH, FACS Pager: [phone]

## 2011-12-10 NOTE — Progress Notes (Signed)
Pt arrived to the unit at 1553 by ED nurse, upon arrival pt wished to sit in chair verse bed, pt sat up and became dizzy, was asked to just sit and rest, then became diapheritic and Nausea  From there he went unresponsive, a code was called, at that time after 30-45 seconds of unresponsiveness pt began to arouse and respond to name. E-link was called Dr. Delford Field responded, labs were ordered, CXR order, new IV started. Dr. Janee Morn called. Dr. Janee Morn at bedside and cardiology was called to consult. Will continue to monitor patient and a order to transfer to ICU .

## 2011-12-10 NOTE — ED Notes (Signed)
Pt taken to CT.

## 2011-12-10 NOTE — Consult Note (Signed)
Reason for Consult: bradycardia/asystole Referring Physician:   [name] is an 77 y.o. male.  HPI:   The patient is a 76 year old Caucasian male with history coronary artery bypass grafting x6 in 04/20/2007 by Dr. Lavinia Martinez. He also has a history of remote tobacco abuse and hyperlipidemia. He had a Myoview stress test approximately 14 months ago which was normal. The patient presented to [facility] today after falling from a ladder.  The patient arrives to 3300 became dizzy and became diaphoretic with nausea. He became unresponsive for approximately 30-45 seconds.  This occurred prior to the patient being hooked up to monitor. After he regained consciousness he did have an approximately 4 second and  2 second pause on the monitor which resolved quickly.  The patient currently has no complaints of nausea, vomiting, chest pressure, shortness of breath, palpitations, lower extremity edema.  Past Medical History  Diagnosis Date  . Hypertension   . Coronary artery disease     Past Surgical History  Procedure Date  . Bypass graft     History reviewed. No pertinent family history.  Social History:  reports that he has never smoked. He does not have any smokeless tobacco history on file. He reports that he does not drink alcohol or use illicit drugs.  Allergies: No Known Allergies  Medications:  Lopressor 25 mg twice daily, aspirin 81 mg daily, simvastatin 80 mg daily  Results for orders placed during the hospital encounter of 12/10/11 (from the past 48 hour(s))  CBC     Status: Abnormal   Collection Time   12/10/11 10:40 AM      Component Value Range Comment   WBC 10.6 (*) 4.0 - 10.5 (K/uL)    RBC 5.41  4.22 - 5.81 (MIL/uL)    Hemoglobin 16.4  13.0 - 17.0 (g/dL)    HCT 69.6  29.5 - 28.4 (%)    MCV 87.4  78.0 - 100.0 (fL)    MCH 30.3  26.0 - 34.0 (pg)    MCHC 34.7  30.0 - 36.0 (g/dL)    RDW 13.2  44.0 - 10.2 (%)    Platelets 165  150 - 400 (K/uL)   DIFFERENTIAL     Status: Normal   Collection Time   12/10/11 10:40 AM      Component Value Range Comment   Neutrophils Relative 71  43 - 77 (%)    Neutro Abs 7.5  1.7 - 7.7 (K/uL)    Lymphocytes Relative 19  12 - 46 (%)    Lymphs Abs 2.0  0.7 - 4.0 (K/uL)    Monocytes Relative 7  3 - 12 (%)    Monocytes Absolute 0.7  0.1 - 1.0 (K/uL)    Eosinophils Relative 2  0 - 5 (%)    Eosinophils Absolute 0.3  0.0 - 0.7 (K/uL)    Basophils Relative 1  0 - 1 (%)    Basophils Absolute 0.1  0.0 - 0.1 (K/uL)   POCT I-STAT, CHEM 8     Status: Abnormal   Collection Time   12/10/11 11:51 AM      Component Value Range Comment   Sodium 144  135 - 145 (mEq/L)    Potassium 4.4  3.5 - 5.1 (mEq/L)    Chloride 105  96 - 112 (mEq/L)    BUN 31 (*) 6 - 23 (mg/dL)    Creatinine, Ser 7.25  0.50 - 1.35 (mg/dL)    Glucose, Bld 366 (*) 70 - 99 (mg/dL)  Calcium, Ion 1.19  1.12 - 1.32 (mmol/L)    TCO2 28  0 - 100 (mmol/L)    Hemoglobin 17.0  13.0 - 17.0 (g/dL)    HCT 40.9  81.1 - 91.4 (%)   CARDIAC PANEL(CRET KIN+CKTOT+MB+TROPI)     Status: Abnormal   Collection Time   12/10/11  4:12 PM      Component Value Range Comment   Total CK 2539 (*) 7 - 232 (U/L)    CK, MB 44.3 (*) 0.3 - 4.0 (ng/mL)    Troponin I <0.30  <0.30 (ng/mL)    Relative Index 1.7  0.0 - 2.5    CBC     Status: Abnormal   Collection Time   12/10/11  4:12 PM      Component Value Range Comment   WBC 15.9 (*) 4.0 - 10.5 (K/uL)    RBC 5.15  4.22 - 5.81 (MIL/uL)    Hemoglobin 15.5  13.0 - 17.0 (g/dL)    HCT 78.2  95.6 - 21.3 (%)    MCV 86.4  78.0 - 100.0 (fL)    MCH 30.1  26.0 - 34.0 (pg)    MCHC 34.8  30.0 - 36.0 (g/dL)    RDW 08.6  57.8 - 46.9 (%)    Platelets 165  150 - 400 (K/uL)   BASIC METABOLIC PANEL     Status: Abnormal   Collection Time   12/10/11  4:12 PM      Component Value Range Comment   Sodium 141  135 - 145 (mEq/L)    Potassium 4.3  3.5 - 5.1 (mEq/L)    Chloride 103  96 - 112 (mEq/L)    CO2 23  19 - 32 (mEq/L)    Glucose, Bld 161 (*) 70 - 99 (mg/dL)    BUN 27  (*) 6 - 23 (mg/dL)    Creatinine, Ser 6.29  0.50 - 1.35 (mg/dL)    Calcium 8.8  8.4 - 10.5 (mg/dL)    GFR calc non Af Amer 84 (*) >90 (mL/min)    GFR calc Af Amer >90  >90 (mL/min)     Ct Chest W Contrast  12/10/2011  *RADIOLOGY REPORT*  Clinical Data: Right upper chest pain after fall  CT CHEST WITH CONTRAST  Technique:  Multidetector CT imaging of the chest was performed following the standard protocol during bolus administration of intravenous contrast.  Contrast:  75 ml Omni 300  Comparison: None.  Findings: There is a very small anterior right apical pneumothorax with adjacent subcutaneous emphysema and a small amount of anterior right upper lobe pulmonary contusion.  Several anterior lateral nondisplaced rib fractures are identified, involving ribs two, four, five, and six on the right.  Otherwise, the lungs are clear.  There is no axillary, mediastinal, or hilar adenopathy.  There is no mediastinal fluid.  The visualized upper abdomen is unremarkable.  Coronary artery and aortic atherosclerotic calcification is present.  The  IMPRESSION: There is a very small anterior right apical pneumothorax with adjacent right upper lobe pulmonary contusion and subcutaneous emphysema associated with several nondisplaced right upper rib fractures.  Original Report Authenticated By: Brandon Melnick, M.D.   Ct Cervical Spine Wo Contrast  12/10/2011  *RADIOLOGY REPORT*  Clinical Data: Pain after fall  CT CERVICAL SPINE WITHOUT CONTRAST  Technique:  Multidetector CT imaging of the cervical spine was performed. Multiplanar CT image reconstructions were also generated.  Comparison: None.  Findings: The odontoid is intact and the lateral masses are well- aligned.  The AP and lateral cervical alignment are within normal limits.  The prevertebral soft tissue stripe is normal.  There is cervical spondylosis from C3-C7.  There is mild osseous encroachment upon the neural foramina bilaterally at C3-C4 and C6- C7 secondary to  uncinate process hypertrophy.  There is no evidence of fracture or dislocation.  The central canal is unremarkable.  IMPRESSION: No acute abnormality.  Original Report Authenticated By: Brandon Melnick, M.D.   Dg Chest Port 1 View  12/10/2011  *RADIOLOGY REPORT*  Clinical Data: Unresponsive  PORTABLE CHEST - 1 VIEW  Comparison: 12/10/2011  Findings: Cardiomediastinal silhouette is stable.  Status post CABG again noted. No pulmonary edema.  No diagnostic pneumothorax. There is  right upper lobe paratracheal atelectasis or lung contusion. This was better visualized on recent CT scan.   IMPRESSION:  Status post CABG again noted. No pulmonary edema.  No diagnostic pneumothorax. There is  right upper lobe paratracheal atelectasis or lung contusion.  This was better visualized on recent CT scan.  Original Report Authenticated By: Natasha Mead, M.D.   Dg Chest Port 1 View  12/10/2011  *RADIOLOGY REPORT*  Clinical Data: Larey Seat 14 feet from ladder, right anterior rib pain, history hypertension  PORTABLE CHEST - 1 VIEW  Comparison: Portable exam 1059 hours compared to 06/15/2007  Findings: Normal heart size post median sternotomy. Atherosclerotic calcification aorta. Mediastinal contours and pulmonary vascularity otherwise normal. Slightly increased density at the right upper lobe and anterior aspect of the right first rib appears stable since earlier study of 04/22/2007. Lungs grossly clear. No definite pleural effusion or pneumothorax. No fractures identified.  IMPRESSION: No definite acute abnormalities identified.  Original Report Authenticated By: Lollie Marrow, M.D.    Review of Systems  Constitutional: Negative for fever and diaphoresis.  HENT: Negative for congestion and sore throat.   Eyes: Negative for blurred vision.  Respiratory: Negative for cough and shortness of breath.   Cardiovascular: Negative for chest pain, palpitations, orthopnea and leg swelling.  Gastrointestinal: Negative for nausea and  vomiting.  Musculoskeletal: Positive for myalgias (Right side of chest).  Neurological: Negative for dizziness, weakness and headaches.   Blood pressure 146/56, pulse 76, temperature 98 F (36.7 C), temperature source Oral, resp. rate 18, height 5\' 8"  (1.727 m), weight 86.3 kg (190 lb 4.1 oz), SpO2 99.00%. Physical Exam  Constitutional: He is oriented to person, place, and time. He appears well-developed and well-nourished. No distress.  HENT:  Head: Normocephalic and atraumatic.  Eyes: EOM are normal. Pupils are equal, round, and reactive to light.  Neck: Normal range of motion. Neck supple.  Cardiovascular: Normal rate and regular rhythm.   No murmur heard. Pulses:      Radial pulses are 2+ on the right side, and 2+ on the left side.       Dorsalis pedis pulses are 2+ on the right side, and 2+ on the left side.       No carotid Bruit   Respiratory: Effort normal and breath sounds normal. He has no wheezes. He has no rales.  GI: Soft. Bowel sounds are normal. There is no tenderness.  Musculoskeletal: He exhibits no edema.  Lymphadenopathy:    He has no cervical adenopathy.  Neurological: He is alert and oriented to person, place, and time.  Skin: Skin is warm and dry.  Psychiatric: He has a normal mood and affect.    Assessment/Plan: Patient Active Hospital Problem List: Bradycardia (12/10/2011)  Plan:  We'll discontinue the Lopressor. Add hydralazine  10 mg IV every 6 when necessary for SBP greater than 160. Will continue to follow.    HAGER, BRYAN 12/10/2011, 6:08 PM      Agree with note written by Jones Skene PAC  Pt well known to mw with H/o CAD S/P CABG in 2008 with a subsequent negative myoview. I recently saw in office and he was w/o complaint. He is admitted now to the trauma service after having fallen off of a ladder with multiple rib fractures. On the monitor be has had episodes of long pauses secondary to CHB. At one point he had near syncope and almost required  resuscitation according to the nursing staff. His exam is benign and labs OK. I'm wondering whether his brady events are vagally mediated or potentially are responsible for him falling off of the ladder. He was only on a low dose BB which we will hold. He may require PTVPM will continue to monitor.  Runell Gess 12/11/2011 6:16 AM

## 2011-12-10 NOTE — H&P (Signed)
Chad Martinez is an 77 y.o. male.   Chief Complaint: Right rib pain after fall HPI: Patient was up on a ladder earlier today proximally 14 feet cleaning off the roof of his shop on his property. The ladder slipped and he fell down. There was no loss of consciousness. He had some left hip pain and right rib pain. He came in as a level II trauma. Workup revealed multiple right-sided rib fractures with tiny pneumothorax. I was asked to evaluate him from a trauma standpoint. He claims his pain is now resolved. He still has some right rib pain especially with deep breaths or movement.  Past Medical History  Diagnosis Date  . Hypertension   . Coronary artery disease     Past Surgical History  Procedure Date  . Bypass graft     History reviewed. No pertinent family history. Social History:  reports that he has never smoked. He does not have any smokeless tobacco history on file. He reports that he does not drink alcohol or use illicit drugs.  Allergies: No Known Allergies   (Not in a hospital admission)  Results for orders placed during the hospital encounter of 12/10/11 (from the past 48 hour(s))  CBC     Status: Abnormal   Collection Time   12/10/11 10:40 AM      Component Value Range Comment   WBC 10.6 (*) 4.0 - 10.5 (K/uL)    RBC 5.41  4.22 - 5.81 (MIL/uL)    Hemoglobin 16.4  13.0 - 17.0 (g/dL)    HCT 78.2  95.6 - 21.3 (%)    MCV 87.4  78.0 - 100.0 (fL)    MCH 30.3  26.0 - 34.0 (pg)    MCHC 34.7  30.0 - 36.0 (g/dL)    RDW 08.6  57.8 - 46.9 (%)    Platelets 165  150 - 400 (K/uL)   DIFFERENTIAL     Status: Normal   Collection Time   12/10/11 10:40 AM      Component Value Range Comment   Neutrophils Relative 71  43 - 77 (%)    Neutro Abs 7.5  1.7 - 7.7 (K/uL)    Lymphocytes Relative 19  12 - 46 (%)    Lymphs Abs 2.0  0.7 - 4.0 (K/uL)    Monocytes Relative 7  3 - 12 (%)    Monocytes Absolute 0.7  0.1 - 1.0 (K/uL)    Eosinophils Relative 2  0 - 5 (%)    Eosinophils Absolute 0.3   0.0 - 0.7 (K/uL)    Basophils Relative 1  0 - 1 (%)    Basophils Absolute 0.1  0.0 - 0.1 (K/uL)   POCT I-STAT, CHEM 8     Status: Abnormal   Collection Time   12/10/11 11:51 AM      Component Value Range Comment   Sodium 144  135 - 145 (mEq/L)    Potassium 4.4  3.5 - 5.1 (mEq/L)    Chloride 105  96 - 112 (mEq/L)    BUN 31 (*) 6 - 23 (mg/dL)    Creatinine, Ser 6.29  0.50 - 1.35 (mg/dL)    Glucose, Bld 528 (*) 70 - 99 (mg/dL)    Calcium, Ion 4.13  1.12 - 1.32 (mmol/L)    TCO2 28  0 - 100 (mmol/L)    Hemoglobin 17.0  13.0 - 17.0 (g/dL)    HCT 24.4  01.0 - 27.2 (%)    Ct Chest W Contrast  12/10/2011  *RADIOLOGY REPORT*  Clinical Data: Right upper chest pain after fall  CT CHEST WITH CONTRAST  Technique:  Multidetector CT imaging of the chest was performed following the standard protocol during bolus administration of intravenous contrast.  Contrast:  75 ml Omni 300  Comparison: None.  Findings: There is a very small anterior right apical pneumothorax with adjacent subcutaneous emphysema and a small amount of anterior right upper lobe pulmonary contusion.  Several anterior lateral nondisplaced rib fractures are identified, involving ribs two, four, five, and six on the right.  Otherwise, the lungs are clear.  There is no axillary, mediastinal, or hilar adenopathy.  There is no mediastinal fluid.  The visualized upper abdomen is unremarkable.  Coronary artery and aortic atherosclerotic calcification is present.  The  IMPRESSION: There is a very small anterior right apical pneumothorax with adjacent right upper lobe pulmonary contusion and subcutaneous emphysema associated with several nondisplaced right upper rib fractures.  Original Report Authenticated By: Brandon Melnick, M.D.   Ct Cervical Spine Wo Contrast  12/10/2011  *RADIOLOGY REPORT*  Clinical Data: Pain after fall  CT CERVICAL SPINE WITHOUT CONTRAST  Technique:  Multidetector CT imaging of the cervical spine was performed. Multiplanar CT image  reconstructions were also generated.  Comparison: None.  Findings: The odontoid is intact and the lateral masses are well- aligned.  The AP and lateral cervical alignment are within normal limits.  The prevertebral soft tissue stripe is normal.  There is cervical spondylosis from C3-C7.  There is mild osseous encroachment upon the neural foramina bilaterally at C3-C4 and C6- C7 secondary to uncinate process hypertrophy.  There is no evidence of fracture or dislocation.  The central canal is unremarkable.  IMPRESSION: No acute abnormality.  Original Report Authenticated By: Brandon Melnick, M.D.   Dg Chest Port 1 View  12/10/2011  *RADIOLOGY REPORT*  Clinical Data: Larey Seat 14 feet from ladder, right anterior rib pain, history hypertension  PORTABLE CHEST - 1 VIEW  Comparison: Portable exam 1059 hours compared to 06/15/2007  Findings: Normal heart size post median sternotomy. Atherosclerotic calcification aorta. Mediastinal contours and pulmonary vascularity otherwise normal. Slightly increased density at the right upper lobe and anterior aspect of the right first rib appears stable since earlier study of 04/22/2007. Lungs grossly clear. No definite pleural effusion or pneumothorax. No fractures identified.  IMPRESSION: No definite acute abnormalities identified.  Original Report Authenticated By: Lollie Marrow, M.D.    Review of Systems  Constitutional: Negative.   HENT: Negative.   Eyes:       Multiple eye surgeries in the past with significantly decreased vision on the left side  Respiratory: Negative for cough, shortness of breath and wheezing.        Pain with deep inspiration  Cardiovascular:       History of coronary artery disease, right anterior rib pain  Gastrointestinal: Negative.   Genitourinary:       Recent penile skin grafts due to nonhealing wound  Musculoskeletal:       Left hip pain now resolved  Skin:       Left ankle abrasion  Neurological: Negative for loss of consciousness.        No loss of consciousness at the scene but was initially somewhat dazed according to his wife  Endo/Heme/Allergies: Negative.   Psychiatric/Behavioral: Negative.     Blood pressure 142/70, pulse 75, temperature 98.1 F (36.7 C), temperature source Oral, resp. rate 18, SpO2 98.00%. Physical Exam  Constitutional: He is  oriented to person, place, and time. He appears well-developed and well-nourished. No distress.  HENT:  Head: Normocephalic and atraumatic.  Mouth/Throat: Oropharynx is clear and moist. No oropharyngeal exudate.  Eyes: EOM are normal. Right eye exhibits no discharge. Left eye exhibits no discharge. No scleral icterus.       Left pupil postoperative, right pupil 2 mm and reactive  Neck: Normal range of motion. Neck supple.       No posterior midline tenderness  Cardiovascular: Normal rate, regular rhythm, normal heart sounds and intact distal pulses.   No murmur heard. Respiratory: Effort normal and breath sounds normal. No respiratory distress. He has no wheezes. He has no rales. He exhibits tenderness.       Significant right anterior rib tenderness especially inferior to the nipple area No crepitance  GI: Soft. He exhibits no distension. There is no tenderness. There is no rebound and no guarding.  Genitourinary:       Skin graft on penis  Musculoskeletal: Normal range of motion.       Abrasion over left medial malleolus without deformity  Neurological: He is alert and oriented to person, place, and time.       Glasgow Coma Scale 15  Skin: Skin is warm and dry.       Abrasion over left medial malleolus without deformity     Assessment/Plan Fall from ladder: Multiple right-sided rib fractures with tiny occult pneumothorax and no significant hemothorax. Also has left ankle abrasion and possible mild concussion. We'll plan to admit to trauma service on the step down unit. We will address pulmonary toilet and pain control. We'll check a followup chest x-ray in the morning.  I discussed the possible need for chest tube placement if his pneumothorax worsens. I spoke at length with he and his wife. I answered their questions.  Aryam Zhan E 12/10/2011, 2:44 PM

## 2011-12-10 NOTE — ED Notes (Signed)
Pt to department via EMS after falling from ladder this morning. Pt denies any LOC. Arrived on LSB and c-collar in place. C/o left hip pain and right sided chest pain.

## 2011-12-10 NOTE — Progress Notes (Signed)
Chaplain's Note:  Responded to Code.  Offered pastoral presence during care for pt.  Introduced myself to pt wife when she entered and offered support.  Pt wife thanked me for my presence.  Will follow-up as needed or requested.

## 2011-12-10 NOTE — ED Provider Notes (Signed)
History     CSN: [id number]  Arrival date & time 12/10/11  1039   First MD Initiated Contact with Patient 12/10/11 1051      Chief Complaint  Patient presents with  . Illegal value: [    Level II    (Consider location/radiation/quality/duration/timing/severity/associated sxs/prior treatment) The history is provided by the patient, the EMS personnel and the spouse.   he states is a 77 year old, male, with a history of hypertension, and coronary artery disease.  He was on a ladder and fell, approximately 14 feet onto grass and normal.  He complains of right sided chest pain.  He denies hitting his head.  He specifically denies headache, neck pain, vision changes, nausea, vomiting.  He denies weakness, or paresthesias.  He denies back pain, chest pain, and abdominal pain.  He is not taking any anticoagulants.  Level V caveat applies for urgent need for intervention.  Level II trauma.  Called  Past Medical History  Diagnosis Date  . Hypertension   . Coronary artery disease     Past Surgical History  Procedure Date  . Bypass graft     History reviewed. No pertinent family history.  History  Substance Use Topics  . Smoking status: Never Smoker   . Smokeless tobacco: Not on file  . Alcohol Use: No      Review of Systems  HENT: Negative for nosebleeds.   Eyes: Negative for visual disturbance.  Respiratory: Negative for shortness of breath.   Cardiovascular: Positive for chest pain.       Right Sided chest pain  Gastrointestinal: Negative for nausea, vomiting and abdominal pain.  Musculoskeletal: Negative for back pain.  Skin: Negative for wound.  Neurological: Negative for weakness, numbness and headaches.  Hematological: Does not bruise/bleed easily.  Psychiatric/Behavioral: Negative for confusion.  All other systems reviewed and are negative.    Allergies  Review of patient's allergies indicates no known allergies.  Home Medications   Current Outpatient Rx    Name Route Sig Dispense Refill  . ASPIRIN EC 81 MG PO TBEC Oral Take 81 mg by mouth daily.    Marland Kitchen METOPROLOL TARTRATE 25 MG PO TABS Oral Take 25 mg by mouth 2 (two) times daily.    Marland Kitchen SIMVASTATIN 80 MG PO TABS Oral Take 80 mg by mouth at bedtime.      BP 142/70  Pulse 75  Temp(Src) 98.1 F (36.7 C) (Oral)  Resp 18  SpO2 98%  Physical Exam  Nursing note and vitals reviewed. Constitutional: He is oriented to person, place, and time. He appears well-developed and well-nourished. No distress.  HENT:  Head: Normocephalic and atraumatic.  Eyes: Conjunctivae and EOM are normal.  Neck: No tracheal deviation present.       C-spine is nontender  Cardiovascular: Normal rate and regular rhythm.   No murmur heard. Pulmonary/Chest: Effort normal. No respiratory distress. He exhibits tenderness.       Right lower anterior chest wall tenderness No crepitance, ecchymoses, or skin lesions  Abdominal: Soft. Bowel sounds are normal. There is no tenderness.  Musculoskeletal: Normal range of motion. He exhibits no edema and no tenderness.       Tender right lower cw No spinal tenderness or extremity deformity or tenderness  Neurological: He is alert and oriented to person, place, and time. No cranial nerve deficit.  Skin: Skin is warm and dry.  Psychiatric: He has a normal mood and affect. Thought content normal.    ED Course  Procedures (including  critical care time) 77 year old, male, with right chest pain after falling off of a ladder.  No injuries to his head or neck.  No apparent abdominal injuries or extremity injuries.  We will scan.  His neck, and chest following a portable chest x-ray  Labs Reviewed  POCT I-STAT, CHEM 8 - Abnormal; Notable for the following:    BUN 31 (*)    Glucose, Bld 137 (*)    All other components within normal limits  CBC - Abnormal; Notable for the following:    WBC 10.6 (*)    All other components within normal limits  DIFFERENTIAL   Ct Chest W  Contrast  12/10/2011  *RADIOLOGY REPORT*  Clinical Data: Right upper chest pain after fall  CT CHEST WITH CONTRAST  Technique:  Multidetector CT imaging of the chest was performed following the standard protocol during bolus administration of intravenous contrast.  Contrast:  75 ml Omni 300  Comparison: None.  Findings: There is a very small anterior right apical pneumothorax with adjacent subcutaneous emphysema and a small amount of anterior right upper lobe pulmonary contusion.  Several anterior lateral nondisplaced rib fractures are identified, involving ribs two, four, five, and six on the right.  Otherwise, the lungs are clear.  There is no axillary, mediastinal, or hilar adenopathy.  There is no mediastinal fluid.  The visualized upper abdomen is unremarkable.  Coronary artery and aortic atherosclerotic calcification is present.  The  IMPRESSION: There is a very small anterior right apical pneumothorax with adjacent right upper lobe pulmonary contusion and subcutaneous emphysema associated with several nondisplaced right upper rib fractures.  Original Report Authenticated By: Brandon Melnick, M.D.   Ct Cervical Spine Wo Contrast  12/10/2011  *RADIOLOGY REPORT*  Clinical Data: Pain after fall  CT CERVICAL SPINE WITHOUT CONTRAST  Technique:  Multidetector CT imaging of the cervical spine was performed. Multiplanar CT image reconstructions were also generated.  Comparison: None.  Findings: The odontoid is intact and the lateral masses are well- aligned.  The AP and lateral cervical alignment are within normal limits.  The prevertebral soft tissue stripe is normal.  There is cervical spondylosis from C3-C7.  There is mild osseous encroachment upon the neural foramina bilaterally at C3-C4 and C6- C7 secondary to uncinate process hypertrophy.  There is no evidence of fracture or dislocation.  The central canal is unremarkable.  IMPRESSION: No acute abnormality.  Original Report Authenticated By: Brandon Melnick,  M.D.   Dg Chest Port 1 View  12/10/2011  *RADIOLOGY REPORT*  Clinical Data: Larey Seat 14 feet from ladder, right anterior rib pain, history hypertension  PORTABLE CHEST - 1 VIEW  Comparison: Portable exam 1059 hours compared to 06/15/2007  Findings: Normal heart size post median sternotomy. Atherosclerotic calcification aorta. Mediastinal contours and pulmonary vascularity otherwise normal. Slightly increased density at the right upper lobe and anterior aspect of the right first rib appears stable since earlier study of 04/22/2007. Lungs grossly clear. No definite pleural effusion or pneumothorax. No fractures identified.  IMPRESSION: No definite acute abnormalities identified.  Original Report Authenticated By: Lollie Marrow, M.D.     No diagnosis found.  Spoke with trauma md. They will come eval for tx of ptx, rib fxs and pulm contusion  MDM   Right-sided pneumothorax, pulmonary contusion, and multiple rib fractures        Cheri Guppy, MD 12/10/11 [phone]

## 2011-12-10 NOTE — Progress Notes (Signed)
Pt having intermittent episodes of bradycardia with HR as low as 32.  On examination of EKG strip, several p waves not followed by QRS complexes noted.  Appears to have rare intermittent pauses, duration noted to be between 1.9 and 2.1 seconds.  Pt asymptomatic, except for 1 complaint of a "cold sweat" feeling that lasted about 2 minutes.  Pt states that this is similar to the feeling he had felt earlier when he had "passed out" before ICU.  Vital signs stable; BP adequate. PA Donell Sievert made aware.  Order given for dopamine if SBP decreases to below 90 with continued bradycardia and follow ACLS protocol if needed.  Pt currently resting comfortably, no complaints.  Will continue to monitor closely.

## 2011-12-10 NOTE — ED Notes (Signed)
Trauma MD at the bedside. 

## 2011-12-11 ENCOUNTER — Encounter (HOSPITAL_COMMUNITY): Admission: EM | Disposition: A | Discharge status: Home / Self Care | Payer: Self-pay | Source: Home / Self Care

## 2011-12-11 ENCOUNTER — Inpatient Hospital Stay (HOSPITAL_COMMUNITY): Payer: Medicare Other

## 2011-12-11 DIAGNOSIS — I10 Essential (primary) hypertension: Secondary | ICD-10-CM | POA: Diagnosis present

## 2011-12-11 HISTORY — PX: PERMANENT PACEMAKER INSERTION: SHX6023

## 2011-12-11 HISTORY — PX: PERMANENT PACEMAKER INSERTION: SHX5480

## 2011-12-11 LAB — CBC
Hemoglobin: 13.5 g/dL (ref 13.0–17.0)
MCHC: 35.2 g/dL (ref 30.0–36.0)
Platelets: 147 10*3/uL — ABNORMAL LOW (ref 150–400)
RDW: 13.1 % (ref 11.5–15.5)

## 2011-12-11 LAB — BASIC METABOLIC PANEL
GFR calc Af Amer: >90 mL/min (ref >90)
GFR calc non Af Amer: 84 mL/min — ABNORMAL LOW (ref >90)
Glucose, Bld: 192 mg/dL — ABNORMAL HIGH (ref 70–99)
Potassium: 4.5 mEq/L (ref 3.5–5.1)
Sodium: 137 mEq/L (ref 135–145)

## 2011-12-11 LAB — CARDIAC PANEL(CRET KIN+CKTOT+MB+TROPI)
CK, MB: 19.2 ng/mL (ref 0.3–4.0)
Relative Index: 1.3 (ref 0.0–2.5)
Total CK: 2426 U/L — ABNORMAL HIGH (ref 7–232)
Total CK: 1990 U/L — ABNORMAL HIGH (ref 7–232)
Troponin I: <0.3 ng/mL (ref <0.30)

## 2011-12-11 SURGERY — PERMANENT PACEMAKER INSERTION
Anesthesia: LOCAL

## 2011-12-11 MED ORDER — FENTANYL CITRATE 0.05 MG/ML IJ SOLN
INTRAMUSCULAR | Status: AC
  Filled 2011-12-11: qty 2

## 2011-12-11 MED ORDER — ACETAMINOPHEN 325 MG PO TABS: 325.0000 mg | ORAL_TABLET | ORAL | Status: DC | PRN

## 2011-12-11 MED ORDER — METOPROLOL TARTRATE 25 MG PO TABS
25.0000 mg | ORAL_TABLET | Freq: Two times a day (BID) | ORAL | Status: DC
  Administered 2011-12-11 – 2011-12-13 (×4): 25 mg via ORAL
  Filled 2011-12-11 (×5): qty 1

## 2011-12-11 MED ORDER — CEFAZOLIN SODIUM 1-5 GM-% IV SOLN
1.0000 g | Freq: Four times a day (QID) | INTRAVENOUS | Status: AC
  Administered 2011-12-11 – 2011-12-12 (×3): 1 g via INTRAVENOUS
  Filled 2011-12-11 (×4): qty 50

## 2011-12-11 MED ORDER — SODIUM CHLORIDE 0.9 % IV SOLN: 250.0000 mL | INTRAVENOUS | Status: DC

## 2011-12-11 MED ORDER — LIDOCAINE HCL (PF) 1 % IJ SOLN
INTRAMUSCULAR | Status: AC
  Filled 2011-12-11: qty 60

## 2011-12-11 MED ORDER — HEPARIN (PORCINE) IN NACL 2-0.9 UNIT/ML-% IJ SOLN
INTRAMUSCULAR | Status: AC
  Filled 2011-12-11: qty 1000

## 2011-12-11 MED ORDER — SODIUM CHLORIDE 0.9 % IJ SOLN
3.0000 mL | Freq: Two times a day (BID) | INTRAMUSCULAR | Status: DC
  Administered 2011-12-11: 3 mL via INTRAVENOUS

## 2011-12-11 MED ORDER — SODIUM CHLORIDE 0.9 % IJ SOLN: 3.0000 mL | INTRAMUSCULAR | Status: DC | PRN

## 2011-12-11 MED ORDER — ONDANSETRON HCL 4 MG/2ML IJ SOLN: 4.0000 mg | Freq: Four times a day (QID) | INTRAMUSCULAR | Status: DC | PRN

## 2011-12-11 MED ORDER — MIDAZOLAM HCL 2 MG/2ML IJ SOLN
INTRAMUSCULAR | Status: AC
  Filled 2011-12-11: qty 2

## 2011-12-11 MED ORDER — CEFAZOLIN SODIUM-DEXTROSE 2-3 GM-% IV SOLR
2.0000 g | INTRAVENOUS | Status: DC
  Filled 2011-12-11: qty 50

## 2011-12-11 MED ORDER — SODIUM CHLORIDE 0.9 % IR SOLN
80.0000 mg | Status: DC
  Filled 2011-12-11 (×2): qty 2

## 2011-12-11 MED ORDER — SODIUM CHLORIDE 0.45 % IV SOLN
INTRAVENOUS | Status: DC
Start: 2011-12-11 — End: 2011-12-11
  Administered 2011-12-11: 50 mL via INTRAVENOUS

## 2011-12-11 NOTE — Progress Notes (Signed)
Inpatient Diabetes Program Recommendations  AACE/ADA: New Consensus Statement on Inpatient Glycemic Control (2009)  Target Ranges:  Prepandial:   less than 140 mg/dL      Peak postprandial:   less than 180 mg/dL (1-2 hours)      Critically ill patients:  140 - 180 mg/dL   Reason for Visit: Elevated lab glucose of 192 mg/dL and 284 mg/dL  Inpatient Diabetes Program Recommendations Correction (SSI): Please check CBG's HgbA1C: Please check current result.  Last one documented is in 2008 of 5.8%  Note: Thank you, Lenor Coffin, RN, CNS, Diabetes Coordinator [phone])

## 2011-12-11 NOTE — Clinical Documentation Improvement (Signed)
Abnormal Labs Clarification  THIS DOCUMENT IS NOT A PERMANENT PART OF THE MEDICAL RECORD   Please update your documentation within the medical record to reflect your response to this query.                                                                                    12/11/11  Dr. Janee Morn and[facility]  Abnormal findings (laboratory, x-ray, pathologic, and other diagnostic results) are not coded and reported unless the physician indicates their clinical significance.     The medical record reflects the following abnormal labs:  CPK this admission: [id number]  Admitted after falling from a ladder per H&P      Based on your clinical judgment, please document the clinical significance of the abnormal labs and any associated treatment in the progress notes and discharge summary.   In responding to this query please exercise your independent judgment.  The fact that a query is asked, does not imply that any particular answer is desired or expected.   Reviewed:  no additional documentation provided   Thank You,  Jerral Ralph  RN BSN CCDS Certified Clinical Documentation Specialist: Cell   [phone]  Health Information Management [place]   TO RESPOND TO THE THIS QUERY, FOLLOW THE INSTRUCTIONS BELOW:  1. If needed, update documentation for the patient's encounter via the notes activity.  2. Access this query again and click edit on the [employer].  3. After updating, or not, click F2 to complete all highlighted (required) fields concerning your review. Select "additional documentation in the medical record" OR "no additional documentation provided".  4. Click Sign note button.  5. The deficiency will fall out of your InBasket *Please let us know if you are not able to complete this workflow by phone or e-mail (listed below).

## 2011-12-11 NOTE — Progress Notes (Signed)
Subjective: Less sore in right chest.  Objective: Vital signs in last 24 hours: Temp:  [97.4 F (36.3 C)-98.4 F (36.9 C)] 97.4 F (36.3 C) (06/05 0727) Pulse Rate:  [60-93] 72  (06/05 0800) Resp:  [11-23] 14  (06/05 0800) BP: (126-190)/(50-90) 137/56 mmHg (06/05 0800) SpO2:  [97 %-99 %] 97 % (06/05 0800) Weight:  [190 lb 4.1 oz (86.3 kg)] 190 lb 4.1 oz (86.3 kg) (06/04 1635) Last BM Date: 12/10/11 (reported by patient)  Intake/Output from previous day: 06/04 0701 - 06/05 0700 In: 1240 [P.O.:340; I.V.:900] Out: 525 [Urine:525] Intake/Output this shift:    PE: CV:  RRR Chest:mild right sided tenderness; breath sounds equal and clear Abd:  Soft, nontender Extr; calves soft, SCDs on  Lab Results:   Basename 12/11/11 0350 12/10/11 1612  WBC 13.3* 15.9*  HGB 13.5 15.5  HCT 38.4* 44.5  PLT 147* 165   BMET  Basename 12/11/11 0350 12/10/11 1612  NA 137 141  K 4.5 4.3  CL 102 103  CO2 25 23  GLUCOSE 192* 161*  BUN 26* 27*  CREATININE 0.84 0.85  CALCIUM 8.4 8.8   PT/INR No results found for this basename: LABPROT:2,INR:2 in the last 72 hours Comprehensive Metabolic Panel:    Component Value Date/Time   NA 137 12/11/2011 0350   K 4.5 12/11/2011 0350   CL 102 12/11/2011 0350   CO2 25 12/11/2011 0350   BUN 26* 12/11/2011 0350   CREATININE 0.84 12/11/2011 0350   GLUCOSE 192* 12/11/2011 0350   CALCIUM 8.4 12/11/2011 0350   AST 23 06/15/2007 1720   ALT 19 06/15/2007 1720   ALKPHOS 58 06/15/2007 1720   BILITOT 1.0 06/15/2007 1720   PROT 6.7 06/15/2007 1720   ALBUMIN 3.9 06/15/2007 1720     Studies/Results: Ct Chest W Contrast  12/10/2011  *RADIOLOGY REPORT*  Clinical Data: Right upper chest pain after fall  CT CHEST WITH CONTRAST  Technique:  Multidetector CT imaging of the chest was performed following the standard protocol during bolus administration of intravenous contrast.  Contrast:  75 ml Omni 300  Comparison: None.  Findings: There is a very small anterior right apical  pneumothorax with adjacent subcutaneous emphysema and a small amount of anterior right upper lobe pulmonary contusion.  Several anterior lateral nondisplaced rib fractures are identified, involving ribs two, four, five, and six on the right.  Otherwise, the lungs are clear.  There is no axillary, mediastinal, or hilar adenopathy.  There is no mediastinal fluid.  The visualized upper abdomen is unremarkable.  Coronary artery and aortic atherosclerotic calcification is present.  The  IMPRESSION: There is a very small anterior right apical pneumothorax with adjacent right upper lobe pulmonary contusion and subcutaneous emphysema associated with several nondisplaced right upper rib fractures.  Original Report Authenticated By: Brandon Melnick, M.D.   Ct Cervical Spine Wo Contrast  12/10/2011  *RADIOLOGY REPORT*  Clinical Data: Pain after fall  CT CERVICAL SPINE WITHOUT CONTRAST  Technique:  Multidetector CT imaging of the cervical spine was performed. Multiplanar CT image reconstructions were also generated.  Comparison: None.  Findings: The odontoid is intact and the lateral masses are well- aligned.  The AP and lateral cervical alignment are within normal limits.  The prevertebral soft tissue stripe is normal.  There is cervical spondylosis from C3-C7.  There is mild osseous encroachment upon the neural foramina bilaterally at C3-C4 and C6- C7 secondary to uncinate process hypertrophy.  There is no evidence of fracture or dislocation.  The central canal is unremarkable.  IMPRESSION: No acute abnormality.  Original Report Authenticated By: Brandon Melnick, M.D.   Dg Chest Port 1 View  12/11/2011  *RADIOLOGY REPORT*  Clinical Data: Followup rib fractures and pneumothorax.  PORTABLE CHEST - 1 VIEW  Comparison: 12/10/2011 chest x-ray and CT.  Findings: The CT detected right-sided rib fractures, pneumothorax and pulmonary contusion are not as well appreciated on the present plain film exam.  Right apical opacity (cap)  suggestive of hemorrhage minimally more prominent than on the prior exams.  Post CABG.  Calcified tortuous aorta.  Heart size within normal limits.  Central pulmonary vascular prominence without pulmonary edema.  IMPRESSION: The CT detected right-sided rib fractures, pneumothorax and pulmonary contusion are not as well appreciated on the present plain film exam.  Right apical opacity (cap) suggestive of hemorrhage minimally more prominent than on the prior exams.  Original Report Authenticated By: Fuller Canada, M.D.   Dg Chest Port 1 View  12/10/2011  *RADIOLOGY REPORT*  Clinical Data: Unresponsive  PORTABLE CHEST - 1 VIEW  Comparison: 12/10/2011  Findings: Cardiomediastinal silhouette is stable.  Status post CABG again noted. No pulmonary edema.  No diagnostic pneumothorax. There is  right upper lobe paratracheal atelectasis or lung contusion. This was better visualized on recent CT scan.   IMPRESSION:  Status post CABG again noted. No pulmonary edema.  No diagnostic pneumothorax. There is  right upper lobe paratracheal atelectasis or lung contusion.  This was better visualized on recent CT scan.  Original Report Authenticated By: Natasha Mead, M.D.   Dg Chest Port 1 View  12/10/2011  *RADIOLOGY REPORT*  Clinical Data: Larey Seat 14 feet from ladder, right anterior rib pain, history hypertension  PORTABLE CHEST - 1 VIEW  Comparison: Portable exam 1059 hours compared to 06/15/2007  Findings: Normal heart size post median sternotomy. Atherosclerotic calcification aorta. Mediastinal contours and pulmonary vascularity otherwise normal. Slightly increased density at the right upper lobe and anterior aspect of the right first rib appears stable since earlier study of 04/22/2007. Lungs grossly clear. No definite pleural effusion or pneumothorax. No fractures identified.  IMPRESSION: No definite acute abnormalities identified.  Original Report Authenticated By: Lollie Marrow, M.D.    Anti-infectives: Anti-infectives     None      Assessment Principal Problem:  *Ribs, multiple fractures, right, closed-pain is better Active Problems:  Bradycardia secondary to heart block  Pneumothorax, right-NSC  HTN (hypertension)    LOS: 1 day   Plan: Pacemaker today.  Recheck CXR tomorrow.   Chad Martinez 12/11/2011

## 2011-12-11 NOTE — Progress Notes (Signed)
Clinical Social Work Department BRIEF PSYCHOSOCIAL ASSESSMENT 12/11/2011  Patient:  Chad Martinez, Chad Martinez     Account Number:  [id number]     Admit date:  12/10/2011  Clinical Social Worker:  Thomasene Mohair  Date/Time:  12/09/2011 02:00 PM  Referred by:  RN  Date Referred:  12/10/2011 Referred for  Crisis Intervention   Other Referral:   Trauma service d/t fall from ladder   Interview type:  Patient Other interview type:   family -wife and son at bedside    PSYCHOSOCIAL DATA Living Status:  FAMILY Admitted from facility:   Level of care:   Primary support name:  Gunnar Fusi Ganci/ wife/ [phone] Primary support relationship to patient:  SPOUSE Degree of support available:   excellent  4 adult children, 2 in immediate area    CURRENT CONCERNS Current Concerns  Adjustment to Illness   Other Concerns:    SOCIAL WORK ASSESSMENT / PLAN Clinical Social Worker was referred to Pt when he came into ED as a trauma. Pt fell off of ladder while cleaning branches off his workshop roof. Pt reports using ladders often, though family is not supportive of this activity. Pt enjoys being outside and spends most of his day fixing or working on things around the house. Pt retired 20 years ago. He has 4 adult children and several grandchildren. He and his wife have been married over 50 years and live active lifestyles.  Pt denies drug and/or alcohol use. No CSW or CM needs identified at this time. Pt is calm and appreciative of his care.   Assessment/plan status:  No Further Intervention Required Other assessment/ plan:   SBIRT by Trauma CSW once Pt on floor. No other concerns.   Information/referral to community resources:    PATIENT'S/FAMILY'S RESPONSE TO PLAN OF CARE: Pt sore from rib fractures at time of visit. No further complaints, very pleasant with staff.   Frederico Hamman, [place] [phone]  ED social worker

## 2011-12-11 NOTE — CV Procedure (Signed)
Chad Martinez,Chad Martinez Male, 77 y.o., 1934/09/06  Location: [facility]  Bed: NONE  MRN: [id number]  CSN: [id number]  Procedure report  Procedure performed: 1. Implantation of new dual chamber permanent pacemaker 2. Fluoroscopy 3.  Light sedation  Reason for procedure: syncope Symptomatic bradycardia due to: Second degree atrioventricular block Mobitz type I   Procedure performed by: Thurmon Fair, MD  Complications: None  Estimated blood loss: <10 mL  Medications administered during procedure: Ancef 1 g intravenously Lidocaine 1% 30 mL locally,  Fentanyl 100 mcg intravenously Versed 2 mg intravenously  Device details: Generator Medtronic adapta model ADDR L1 serial number [id number] H Right atrial lead Medtronic 50 76-45 cm serial number [id number] Right ventricular lead Medtronic 50 76-52 cm serial number [id number] Procedure details:  After the risks and benefits of the procedure were discussed the patient provided informed consent and was brought to the cardiac cath lab in the fasting state. The patient was prepped and draped in usual sterile fashion. Local anesthesia with 1% lidocaine was administered to to the left infraclavicular area. A 5-6 cm horizontal incision was made parallel with and 2-3 cm caudal to the left clavicle. Using electrocautery and blunt dissection a prepectoral pocket was created down to the level of the pectoralis major muscle fascia. The pocket was carefully inspected for hemostasis. An antibiotic-soaked sponge was placed in the pocket.  Under fluoroscopic guidance and using the modified Seldinger technique 2 separate venipunctures were performed to access the left subclavian vein. No difficulty was encountered accessing the vein.  Two J-tip guidewires were subsequently exchanged for two 7 French safe sheaths.  Under fluoroscopic guidance the ventricular lead was advanced to level of the mid to apical right ventricular septum and thet active-fixation  helix was deployed. Prominent current of injury was seen. Satisfactory pacing and sensing parameters were recorded. There was no evidence of diaphragmatic stimulation at maximum device output. The safe sheath was peeled away and the lead was secured in place with 2-0 silk.  In similar fashion the right atrial lead was advanced to the level of the atrial appendage. The active-fixation helix was deployed. There was prominent current of injury. Satisfactory  pacing and sensing parameters were recorded. There was no evidence of diaphragmatic stimulation with pacing at maximum device output. The safe sheath was peeled away and the lead was secured in place with 2-0 silk.  The antibiotic-soaked sponge was removed from the pocket. The pocket was flushed with copious amounts of antibiotic solution. Reinspection showed excellent hemostasis..  The ventricular lead was connected to the generator and appropriate ventricular pacing was seen. Subsequently the atrial lead was also connected. Repeat testing of the lead parameters later showed excellent values.  The entire system was then carefully inserted in the pocket with care been taking that the leads and device assumed a comfortable position without pressure on the incision. Great care was taken that the leads be located deep to the generator. The pocket was then closed in layers using 2 layers of 2-0 Vicryl and cutaneous staples, after which a sterile dressing was applied.  At the end of the procedure the following lead parameters were encountered:  Right atrial lead  sensed P waves 3.5 mV, impedance 821 ohms, threshold 1.4 V at 0.5 ms pulse width.  Right ventricular lead sensed R waves 9.9 mV, impedance 1396 ohms, threshold 1.2 V at 0.5 ms pulse width.   Cc: Simone Curia, MD

## 2011-12-11 NOTE — Progress Notes (Signed)
Pt transferred to cath lab in bed.

## 2011-12-11 NOTE — Progress Notes (Signed)
UR complete 

## 2011-12-11 NOTE — Progress Notes (Signed)
[facility]  DAILY PROGRESS NOTE   Subjective:  Several more episodes of second degree heart block last evening, while awake and lying down(asymtomatic). Pauses of up to 2.4 seconds. Narrow QRS suggests suprahisian block. Very late ventricular escape beats, He feels better, although still considerable right chest discomfort due to ribe fractures.  Objective:  Temp:  [97.4 F (36.3 C)-98.4 F (36.9 C)] 97.4 F (36.3 C) (06/05 0727) Pulse Rate:  [60-93] 68  (06/05 0700) Resp:  [11-23] 11  (06/05 0700) BP: (126-190)/(50-90) 131/50 mmHg (06/05 0700) SpO2:  [97 %-99 %] 97 % (06/05 0700) Weight:  [86.3 kg (190 lb 4.1 oz)] 86.3 kg (190 lb 4.1 oz) (06/04 1635) Weight change:   Intake/Output from previous day: 06/04 0701 - 06/05 0700 In: 1240 [P.O.:340; I.V.:900] Out: 525 [Urine:525]  Intake/Output from this shift:    Medications: Current Facility-Administered Medications  Medication Dose Route Frequency Provider Last Rate Last Dose  . albuterol-ipratropium (COMBIVENT) inhaler 2 puff  2 puff Inhalation Q6H Liz Malady, MD   2 puff at 12/11/11 0216  . alum & mag hydroxide-simeth (MAALOX/MYLANTA) 200-200-20 MG/5ML suspension 30 mL  30 mL Oral Q4H PRN Liz Malady, MD   30 mL at 12/10/11 2109  . dextrose 5 % and 0.45 % NaCl with KCl 20 mEq/L infusion   Intravenous Continuous Liz Malady, MD 75 mL/hr at 12/10/11 2221    . docusate sodium (COLACE) capsule 100 mg  100 mg Oral BID Liz Malady, MD   100 mg at 12/10/11 2109  . DOPamine (INTROPIN) 800 mg in dextrose 5 % 250 mL infusion  2-20 mcg/kg/min Intravenous Titrated Kerry Hough, PA      . HYDROmorphone (DILAUDID) injection 0.5-1 mg  0.5-1 mg Intravenous Q2H PRN Liz Malady, MD      . morphine 2 MG/ML injection 2 mg  2 mg Intravenous Once Cheri Guppy, MD   2 mg at 12/10/11 1401  . ondansetron (ZOFRAN) tablet 4 mg  4 mg Oral Q6H PRN Liz Malady, MD       Or  . ondansetron  [facility]) injection 4 mg  4 mg Intravenous Q6H PRN Liz Malady, MD      . oxyCODONE (Oxy IR/ROXICODONE) immediate release tablet 10 mg  10 mg Oral Q4H PRN Liz Malady, MD   10 mg at 12/11/11 0418  . oxyCODONE (Oxy IR/ROXICODONE) immediate release tablet 5 mg  5 mg Oral Q4H PRN Liz Malady, MD   5 mg at 12/10/11 1828  . pantoprazole (PROTONIX) EC tablet 40 mg  40 mg Oral Q1200 Liz Malady, MD   40 mg at 12/10/11 1829   Or  . pantoprazole (PROTONIX) injection 40 mg  40 mg Intravenous Q1200 Liz Malady, MD      . tiZANidine (ZANAFLEX) tablet 4 mg  4 mg Oral Q8H PRN Liz Malady, MD      . DISCONTD: metoprolol tartrate (LOPRESSOR) tablet 25 mg  25 mg Oral BID Liz Malady, MD        Physical Exam: Constitutional: He is oriented to person, place, and time. He appears well-developed and well-nourished. No distress.  Head: Normocephalic and atraumatic.  Eyes: EOM are normal. Pupils are equal, round, and reactive to light.  Neck: Normal range of motion. Neck supple.  Cardiovascular: Normal rate and regular rhythm.  No murmur heard.  Chest: clear lungs, healed sternotomy scar Pulses:  Radial pulses are 2+  on the right side, and 2+ on the left side.  Dorsalis pedis pulses are 2+ on the right side, and 2+ on the left side.   Lab Results: Results for orders placed during the hospital encounter of 12/10/11 (from the past 48 hour(s))  CBC     Status: Abnormal   Collection Time   12/10/11 10:40 AM      Component Value Range Comment   WBC 10.6 (*) 4.0 - 10.5 (K/uL)    RBC 5.41  4.22 - 5.81 (MIL/uL)    Hemoglobin 16.4  13.0 - 17.0 (g/dL)    HCT 16.1  09.6 - 04.5 (%)    MCV 87.4  78.0 - 100.0 (fL)    MCH 30.3  26.0 - 34.0 (pg)    MCHC 34.7  30.0 - 36.0 (g/dL)    RDW 40.9  81.1 - 91.4 (%)    Platelets 165  150 - 400 (K/uL)   DIFFERENTIAL     Status: Normal   Collection Time   12/10/11 10:40 AM      Component Value Range Comment   Neutrophils Relative 71  43 - 77 (%)     Neutro Abs 7.5  1.7 - 7.7 (K/uL)    Lymphocytes Relative 19  12 - 46 (%)    Lymphs Abs 2.0  0.7 - 4.0 (K/uL)    Monocytes Relative 7  3 - 12 (%)    Monocytes Absolute 0.7  0.1 - 1.0 (K/uL)    Eosinophils Relative 2  0 - 5 (%)    Eosinophils Absolute 0.3  0.0 - 0.7 (K/uL)    Basophils Relative 1  0 - 1 (%)    Basophils Absolute 0.1  0.0 - 0.1 (K/uL)   POCT I-STAT, CHEM 8     Status: Abnormal   Collection Time   12/10/11 11:51 AM      Component Value Range Comment   Sodium 144  135 - 145 (mEq/L)    Potassium 4.4  3.5 - 5.1 (mEq/L)    Chloride 105  96 - 112 (mEq/L)    BUN 31 (*) 6 - 23 (mg/dL)    Creatinine, Ser 7.82  0.50 - 1.35 (mg/dL)    Glucose, Bld 956 (*) 70 - 99 (mg/dL)    Calcium, Ion 2.13  1.12 - 1.32 (mmol/L)    TCO2 28  0 - 100 (mmol/L)    Hemoglobin 17.0  13.0 - 17.0 (g/dL)    HCT 08.6  57.8 - 46.9 (%)   CARDIAC PANEL(CRET KIN+CKTOT+MB+TROPI)     Status: Abnormal   Collection Time   12/10/11  4:12 PM      Component Value Range Comment   Total CK 2539 (*) 7 - 232 (U/L)    CK, MB 44.3 (*) 0.3 - 4.0 (ng/mL)    Troponin I <0.30  <0.30 (ng/mL)    Relative Index 1.7  0.0 - 2.5    CBC     Status: Abnormal   Collection Time   12/10/11  4:12 PM      Component Value Range Comment   WBC 15.9 (*) 4.0 - 10.5 (K/uL)    RBC 5.15  4.22 - 5.81 (MIL/uL)    Hemoglobin 15.5  13.0 - 17.0 (g/dL)    HCT 62.9  52.8 - 41.3 (%)    MCV 86.4  78.0 - 100.0 (fL)    MCH 30.1  26.0 - 34.0 (pg)    MCHC 34.8  30.0 - 36.0 (g/dL)  RDW 13.1  11.5 - 15.5 (%)    Platelets 165  150 - 400 (K/uL)   BASIC METABOLIC PANEL     Status: Abnormal   Collection Time   12/10/11  4:12 PM      Component Value Range Comment   Sodium 141  135 - 145 (mEq/L)    Potassium 4.3  3.5 - 5.1 (mEq/L)    Chloride 103  96 - 112 (mEq/L)    CO2 23  19 - 32 (mEq/L)    Glucose, Bld 161 (*) 70 - 99 (mg/dL)    BUN 27 (*) 6 - 23 (mg/dL)    Creatinine, Ser 4.09  0.50 - 1.35 (mg/dL)    Calcium 8.8  8.4 - 10.5 (mg/dL)    GFR calc  non Af Amer 84 (*) >90 (mL/min)    GFR calc Af Amer >90  >90 (mL/min)   MRSA PCR SCREENING     Status: Normal   Collection Time   12/10/11  6:16 PM      Component Value Range Comment   MRSA by PCR NEGATIVE  NEGATIVE    CARDIAC PANEL(CRET KIN+CKTOT+MB+TROPI)     Status: Abnormal   Collection Time   12/10/11 11:46 PM      Component Value Range Comment   Total CK 2426 (*) 7 - 232 (U/L)    CK, MB 31.7 (*) 0.3 - 4.0 (ng/mL) CRITICAL VALUE NOTED.  VALUE IS CONSISTENT WITH PREVIOUSLY REPORTED AND CALLED VALUE.   Troponin I <0.30  <0.30 (ng/mL)    Relative Index 1.3  0.0 - 2.5    CBC     Status: Abnormal   Collection Time   12/11/11  3:50 AM      Component Value Range Comment   WBC 13.3 (*) 4.0 - 10.5 (K/uL)    RBC 4.47  4.22 - 5.81 (MIL/uL)    Hemoglobin 13.5  13.0 - 17.0 (g/dL)    HCT 81.1 (*) 91.4 - 52.0 (%)    MCV 85.9  78.0 - 100.0 (fL)    MCH 30.2  26.0 - 34.0 (pg)    MCHC 35.2  30.0 - 36.0 (g/dL)    RDW 78.2  95.6 - 21.3 (%)    Platelets 147 (*) 150 - 400 (K/uL)   BASIC METABOLIC PANEL     Status: Abnormal   Collection Time   12/11/11  3:50 AM      Component Value Range Comment   Sodium 137  135 - 145 (mEq/L)    Potassium 4.5  3.5 - 5.1 (mEq/L)    Chloride 102  96 - 112 (mEq/L)    CO2 25  19 - 32 (mEq/L)    Glucose, Bld 192 (*) 70 - 99 (mg/dL)    BUN 26 (*) 6 - 23 (mg/dL)    Creatinine, Ser 0.86  0.50 - 1.35 (mg/dL)    Calcium 8.4  8.4 - 10.5 (mg/dL)    GFR calc non Af Amer 84 (*) >90 (mL/min)    GFR calc Af Amer >90  >90 (mL/min)   GLUCOSE, CAPILLARY     Status: Abnormal   Collection Time   12/11/11  7:28 AM      Component Value Range Comment   Glucose-Capillary 163 (*) 70 - 99 (mg/dL)     Imaging: Ct Chest W Contrast  12/10/2011  *RADIOLOGY REPORT*  Clinical Data: Right upper chest pain after fall  CT CHEST WITH CONTRAST  Technique:  Multidetector CT imaging of the chest was  performed following the standard protocol during bolus administration of intravenous contrast.   Contrast:  75 ml Omni 300  Comparison: None.  Findings: There is a very small anterior right apical pneumothorax with adjacent subcutaneous emphysema and a small amount of anterior right upper lobe pulmonary contusion.  Several anterior lateral nondisplaced rib fractures are identified, involving ribs two, four, five, and six on the right.  Otherwise, the lungs are clear.  There is no axillary, mediastinal, or hilar adenopathy.  There is no mediastinal fluid.  The visualized upper abdomen is unremarkable.  Coronary artery and aortic atherosclerotic calcification is present.  The  IMPRESSION: There is a very small anterior right apical pneumothorax with adjacent right upper lobe pulmonary contusion and subcutaneous emphysema associated with several nondisplaced right upper rib fractures.  Original Report Authenticated By: Brandon Melnick, M.D.   Ct Cervical Spine Wo Contrast  12/10/2011  *RADIOLOGY REPORT*  Clinical Data: Pain after fall  CT CERVICAL SPINE WITHOUT CONTRAST  Technique:  Multidetector CT imaging of the cervical spine was performed. Multiplanar CT image reconstructions were also generated.  Comparison: None.  Findings: The odontoid is intact and the lateral masses are well- aligned.  The AP and lateral cervical alignment are within normal limits.  The prevertebral soft tissue stripe is normal.  There is cervical spondylosis from C3-C7.  There is mild osseous encroachment upon the neural foramina bilaterally at C3-C4 and C6- C7 secondary to uncinate process hypertrophy.  There is no evidence of fracture or dislocation.  The central canal is unremarkable.  IMPRESSION: No acute abnormality.  Original Report Authenticated By: Brandon Melnick, M.D.   Dg Chest Port 1 View  12/11/2011  *RADIOLOGY REPORT*  Clinical Data: Followup rib fractures and pneumothorax.  PORTABLE CHEST - 1 VIEW  Comparison: 12/10/2011 chest x-ray and CT.  Findings: The CT detected right-sided rib fractures, pneumothorax and pulmonary  contusion are not as well appreciated on the present plain film exam.  Right apical opacity (cap) suggestive of hemorrhage minimally more prominent than on the prior exams.  Post CABG.  Calcified tortuous aorta.  Heart size within normal limits.  Central pulmonary vascular prominence without pulmonary edema.  IMPRESSION: The CT detected right-sided rib fractures, pneumothorax and pulmonary contusion are not as well appreciated on the present plain film exam.  Right apical opacity (cap) suggestive of hemorrhage minimally more prominent than on the prior exams.  Original Report Authenticated By: Fuller Canada, M.D.   Dg Chest Port 1 View  12/10/2011  *RADIOLOGY REPORT*  Clinical Data: Unresponsive  PORTABLE CHEST - 1 VIEW  Comparison: 12/10/2011  Findings: Cardiomediastinal silhouette is stable.  Status post CABG again noted. No pulmonary edema.  No diagnostic pneumothorax. There is  right upper lobe paratracheal atelectasis or lung contusion. This was better visualized on recent CT scan.   IMPRESSION:  Status post CABG again noted. No pulmonary edema.  No diagnostic pneumothorax. There is  right upper lobe paratracheal atelectasis or lung contusion.  This was better visualized on recent CT scan.  Original Report Authenticated By: Natasha Mead, M.D.   Dg Chest Port 1 View  12/10/2011  *RADIOLOGY REPORT*  Clinical Data: Larey Seat 14 feet from ladder, right anterior rib pain, history hypertension  PORTABLE CHEST - 1 VIEW  Comparison: Portable exam 1059 hours compared to 06/15/2007  Findings: Normal heart size post median sternotomy. Atherosclerotic calcification aorta. Mediastinal contours and pulmonary vascularity otherwise normal. Slightly increased density at the right upper lobe and anterior aspect of the  right first rib appears stable since earlier study of 04/22/2007. Lungs grossly clear. No definite pleural effusion or pneumothorax. No fractures identified.  IMPRESSION: No definite acute abnormalities identified.   Original Report Authenticated By: Lollie Marrow, M.D.    Assessment:  1. Active Problems: 2.  Bradycardia 3.  Ribs, multiple fractures, right, closed, initial encounter 4.  Pneumothorax, right 5.   Plan:  1. Syncope related to second degree AV block, probably Mobitz type I. Now off beta blocker, but dose was very small and he preferably should be on a beta  Blocker due to CAD.  Recommend a dual chamber permanent pacemaker. The risks and benefits were discussed in detail with Mr. Fant. He understands and agrees to proceed tentatively planned for this afternoon.  Time Spent Directly with Patient:  30 minutes  Length of Stay:  LOS: 1 day    Ayahna Solazzo 12/11/2011, 8:10 AM

## 2011-12-12 ENCOUNTER — Inpatient Hospital Stay (HOSPITAL_COMMUNITY): Payer: Medicare Other

## 2011-12-12 DIAGNOSIS — I459 Conduction disorder, unspecified: Secondary | ICD-10-CM

## 2011-12-12 LAB — CBC
HCT: 34.3 % — ABNORMAL LOW (ref 39.0–52.0)
Hemoglobin: 11.8 g/dL — ABNORMAL LOW (ref 13.0–17.0)
MCH: 30.1 pg (ref 26.0–34.0)
MCHC: 34.4 g/dL (ref 30.0–36.0)

## 2011-12-12 LAB — GLUCOSE, CAPILLARY: Glucose-Capillary: 186 mg/dL — ABNORMAL HIGH (ref 70–99)

## 2011-12-12 LAB — BASIC METABOLIC PANEL
BUN: 19 mg/dL (ref 6–23)
Calcium: 8.2 mg/dL — ABNORMAL LOW (ref 8.4–10.5)
GFR calc non Af Amer: 82 mL/min — ABNORMAL LOW (ref >90)
Glucose, Bld: 144 mg/dL — ABNORMAL HIGH (ref 70–99)

## 2011-12-12 MED ORDER — IOHEXOL 300 MG/ML  SOLN
75.0000 mL | Freq: Once | INTRAMUSCULAR | Status: AC | PRN
  Administered 2011-12-10: 75 mL via INTRAVENOUS

## 2011-12-12 MED ORDER — IPRATROPIUM-ALBUTEROL 18-103 MCG/ACT IN AERO
2.0000 | INHALATION_SPRAY | Freq: Four times a day (QID) | RESPIRATORY_TRACT | Status: DC
  Administered 2011-12-12 – 2011-12-13 (×4): 2 via RESPIRATORY_TRACT

## 2011-12-12 NOTE — Evaluation (Signed)
Physical Therapy Evaluation Patient Details Name: Chad Martinez MRN: [id number] DOB: [birth year] Today's Date: 12/12/2011 Time: 8119-1478 PT Time Calculation (min): 26 min  PT Assessment / Plan / Recommendation Clinical Impression  Pt admitted after fall from ladder while cleaning off building with right broken ribs and PTX, pt with bradycardia and new pacemaker placement. Pt will benefit from acute therapy to maximize transfers, gait and function prior to discharge to decreased burden of care.     PT Assessment  Patient needs continued PT services    Follow Up Recommendations  Home health PT    Barriers to Discharge None      lEquipment Recommendations  Rolling walker with 5" wheels    Recommendations for Other Services OT consult   Frequency Min 3X/week    Precautions / Restrictions Precautions Precautions: ICD/Pacemaker   Pertinent Vitals/Pain Pain 0/10 at rest end of session 3/10 with ambulation 8/10 with bed mobility      Mobility  Bed Mobility Bed Mobility: Rolling Left;Left Sidelying to Sit;Sitting - Scoot to [place] of Bed Rolling Left: 4: Min assist (partial roll left but not fully onto left arm due to precautions) Left Sidelying to Sit: 3: Mod assist;HOB flat (semi side to sitting with mod assist to elevate trunk) Sitting - Scoot to Edge of Bed: 5: Supervision (cueing for sequence) Pt attempted rolling right but unable due to pain, pt also attempted pivoting legs to EOB to left and simply elevating trunk but not able to fully engage abs to complete trunk elevation Transfers Transfers: Sit to Stand;Stand to Sit Sit to Stand: 4: Min guard;From bed Stand to Sit: To chair/3-in-1;4: Min guard Details for Transfer Assistance: cueing for hand placement and to control descent to surface Ambulation/Gait Ambulation/Gait Assistance: 5: Supervision Ambulation Distance (Feet): 180 Feet Assistive device: Rolling walker Ambulation/Gait Assistance Details: Pt ambulated 15'  without RW with very short slow strides but improved stride and speed with RW and increased steadiness Gait Pattern: Step-through pattern;Decreased stride length Gait velocity: decreased Stairs: Yes Stairs Assistance: 3: Mod assist Stairs Assistance Details (indicate cue type and reason): pt unable to peform backward with RW and able to ascend with HHA mod assist with support at pelvis for balance and cueing to sequence Stair Management Technique: No rails;Other (comment) (hand held assist) Number of Stairs: 1  Wheelchair Mobility Wheelchair Mobility: No    Exercises     PT Diagnosis: Abnormality of gait;Acute pain  PT Problem List: Decreased activity tolerance;Decreased strength;Decreased mobility;Decreased knowledge of use of DME;Pain PT Treatment Interventions: Gait training;Stair training;DME instruction;Functional mobility training;Therapeutic activities;Patient/family education   PT Goals Acute Rehab PT Goals PT Goal Formulation: With patient/family Time For Goal Achievement: 12/19/11 Potential to Achieve Goals: Good Pt will go Supine/Side to Sit: with min assist PT Goal: Supine/Side to Sit - Progress: Goal set today Pt will go Sit to Supine/Side: with min assist PT Goal: Sit to Supine/Side - Progress: Goal set today Pt will go Sit to Stand: with modified independence PT Goal: Sit to Stand - Progress: Goal set today Pt will go Stand to Sit: with modified independence PT Goal: Stand to Sit - Progress: Goal set today Pt will Ambulate: >150 feet;with modified independence;with least restrictive assistive device PT Goal: Ambulate - Progress: Goal set today Pt will Go Up / Down Stairs: 1-2 stairs;with min assist;with least restrictive assistive device PT Goal: Up/Down Stairs - Progress: Goal set today  Visit Information  Last PT Received On: 12/12/11 Assistance Needed: +1    Subjective  Data  Subjective: I've never had pain like this Patient Stated Goal: be able to return  home   Prior Functioning  Home Living Lives With: Spouse Type of Home: House Home Access: Stairs to enter Secretary/administrator of Steps: 1 Entrance Stairs-Rails: None Home Layout: One level Bathroom Shower/Tub: Health visitor: Handicapped height Home Adaptive Equipment: Built-in shower seat Prior Function Level of Independence: Independent Able to Take Stairs?: Yes Driving: Yes Vocation: Retired Musician: No difficulties    Cognition  Overall Cognitive Status: Appears within functional limits for tasks assessed/performed Arousal/Alertness: Awake/alert Orientation Level: Appears intact for tasks assessed Behavior During Session: [facility] for tasks performed    Extremity/Trunk Assessment Right Lower Extremity Assessment RLE ROM/Strength/Tone: Within functional levels Left Lower Extremity Assessment LLE ROM/Strength/Tone: Within functional levels   Balance Balance Balance Assessed: Yes Static Sitting Balance Static Sitting - Balance Support: No upper extremity supported;Feet supported Static Sitting - Level of Assistance: 7: Independent Static Sitting - Comment/# of Minutes: 2  End of Session PT - End of Session Equipment Utilized During Treatment: Gait belt Activity Tolerance: Patient tolerated treatment well Patient left: in chair;with call bell/phone within reach;with family/visitor present Nurse Communication: Mobility status   Delorse Lek 12/12/2011, 2:01 PM  Delaney Meigs, PT [phone]

## 2011-12-12 NOTE — Progress Notes (Signed)
Patient ID: Chad Martinez, male   DOB: 1935/06/27, 77 y.o.   MRN: [id number] 1 Day Post-Op  Subjective: Better, pacer placed yesterday, tol PO  Objective: Vital signs in last 24 hours: Temp:  [98 F (36.7 C)-99.1 F (37.3 C)] 98 F (36.7 C) (06/06 0828) Pulse Rate:  [71-82] 80  (06/06 0828) Resp:  [13-20] 14  (06/06 0828) BP: (112-154)/(40-60) 143/56 mmHg (06/06 0828) SpO2:  [92 %-98 %] 92 % (06/06 0828) Weight:  [87 kg (191 lb 12.8 oz)] 87 kg (191 lb 12.8 oz) (06/06 0528) Last BM Date: 12/10/11  Intake/Output from previous day: 06/05 0701 - 06/06 0700 In: 2325.5 [P.O.:470; I.V.:1855.5] Out: 1975 [Urine:1975] Intake/Output this shift:    General appearance: alert and cooperative Resp: clear to auscultation bilaterally Chest wall: right sided chest wall tenderness, pacemaker dressing L chest Cardio: regular rate and rhythm GI: soft, NT, ND, +BS  Lab Results: CBC   Basename 12/12/11 0412 12/11/11 0350  WBC 10.4 13.3*  HGB 11.8* 13.5  HCT 34.3* 38.4*  PLT 135* 147*   BMET  Basename 12/12/11 0412 12/11/11 0350  NA 137 137  K 4.3 4.5  CL 103 102  CO2 25 25  GLUCOSE 144* 192*  BUN 19 26*  CREATININE 0.90 0.84  CALCIUM 8.2* 8.4   PT/INR  Basename 12/11/11 1209  LABPROT 15.2  INR 1.18   ABG No results found for this basename: PHART:2,PCO2:2,PO2:2,HCO3:2 in the last 72 hours  Studies/Results: Ct Chest W Contrast  12/10/2011  *RADIOLOGY REPORT*  Clinical Data: Right upper chest pain after fall  CT CHEST WITH CONTRAST  Technique:  Multidetector CT imaging of the chest was performed following the standard protocol during bolus administration of intravenous contrast.  Contrast:  75 ml Omni 300  Comparison: None.  Findings: There is a very small anterior right apical pneumothorax with adjacent subcutaneous emphysema and a small amount of anterior right upper lobe pulmonary contusion.  Several anterior lateral nondisplaced rib fractures are identified, involving ribs two,  four, five, and six on the right.  Otherwise, the lungs are clear.  There is no axillary, mediastinal, or hilar adenopathy.  There is no mediastinal fluid.  The visualized upper abdomen is unremarkable.  Coronary artery and aortic atherosclerotic calcification is present.  The  IMPRESSION: There is a very small anterior right apical pneumothorax with adjacent right upper lobe pulmonary contusion and subcutaneous emphysema associated with several nondisplaced right upper rib fractures.  Original Report Authenticated By: Brandon Melnick, M.D.   Ct Cervical Spine Wo Contrast  12/10/2011  *RADIOLOGY REPORT*  Clinical Data: Pain after fall  CT CERVICAL SPINE WITHOUT CONTRAST  Technique:  Multidetector CT imaging of the cervical spine was performed. Multiplanar CT image reconstructions were also generated.  Comparison: None.  Findings: The odontoid is intact and the lateral masses are well- aligned.  The AP and lateral cervical alignment are within normal limits.  The prevertebral soft tissue stripe is normal.  There is cervical spondylosis from C3-C7.  There is mild osseous encroachment upon the neural foramina bilaterally at C3-C4 and C6- C7 secondary to uncinate process hypertrophy.  There is no evidence of fracture or dislocation.  The central canal is unremarkable.  IMPRESSION: No acute abnormality.  Original Report Authenticated By: Brandon Melnick, M.D.   Dg Chest Port 1 View  12/12/2011  *RADIOLOGY REPORT*  Clinical Data: Follow up right pneumothorax  PORTABLE CHEST - 1 VIEW  Comparison: 12/11/2011  Findings: The cardiomediastinal silhouette is stable.  Status post median sternotomy again noted.  There is a dual lead cardiac pacemaker with leads in the right atrium and right ventricle.  No diagnostic pneumothorax.  Stable right apical opacity probable right apical contusion.  Previous right upper rib fractures noted on the CT scan are not clearly identified on x-ray.  No right apical diagnostic pneumothorax.   IMPRESSION:  Status post median sternotomy again noted.  There is a dual lead cardiac pacemaker with leads in the right atrium and right ventricle.  No diagnostic pneumothorax.  Stable right apical opacity probable right apical contusion.  Previous right upper rib fractures noted on the CT scan are not clearly identified on x-ray.  Original Report Authenticated By: Natasha Mead, M.D.   Dg Chest Port 1 View  12/11/2011  *RADIOLOGY REPORT*  Clinical Data: Followup rib fractures and pneumothorax.  PORTABLE CHEST - 1 VIEW  Comparison: 12/10/2011 chest x-ray and CT.  Findings: The CT detected right-sided rib fractures, pneumothorax and pulmonary contusion are not as well appreciated on the present plain film exam.  Right apical opacity (cap) suggestive of hemorrhage minimally more prominent than on the prior exams.  Post CABG.  Calcified tortuous aorta.  Heart size within normal limits.  Central pulmonary vascular prominence without pulmonary edema.  IMPRESSION: The CT detected right-sided rib fractures, pneumothorax and pulmonary contusion are not as well appreciated on the present plain film exam.  Right apical opacity (cap) suggestive of hemorrhage minimally more prominent than on the prior exams.  Original Report Authenticated By: Fuller Canada, M.D.   Dg Chest Port 1 View  12/10/2011  *RADIOLOGY REPORT*  Clinical Data: Unresponsive  PORTABLE CHEST - 1 VIEW  Comparison: 12/10/2011  Findings: Cardiomediastinal silhouette is stable.  Status post CABG again noted. No pulmonary edema.  No diagnostic pneumothorax. There is  right upper lobe paratracheal atelectasis or lung contusion. This was better visualized on recent CT scan.   IMPRESSION:  Status post CABG again noted. No pulmonary edema.  No diagnostic pneumothorax. There is  right upper lobe paratracheal atelectasis or lung contusion.  This was better visualized on recent CT scan.  Original Report Authenticated By: Natasha Mead, M.D.   Dg Chest Port 1  View  12/10/2011  *RADIOLOGY REPORT*  Clinical Data: Larey Seat 14 feet from ladder, right anterior rib pain, history hypertension  PORTABLE CHEST - 1 VIEW  Comparison: Portable exam 1059 hours compared to 06/15/2007  Findings: Normal heart size post median sternotomy. Atherosclerotic calcification aorta. Mediastinal contours and pulmonary vascularity otherwise normal. Slightly increased density at the right upper lobe and anterior aspect of the right first rib appears stable since earlier study of 04/22/2007. Lungs grossly clear. No definite pleural effusion or pneumothorax. No fractures identified.  IMPRESSION: No definite acute abnormalities identified.  Original Report Authenticated By: Lollie Marrow, M.D.    Anti-infectives: Anti-infectives     Start     Dose/Rate Route Frequency Ordered Stop   12/11/11 1900   ceFAZolin (ANCEF) IVPB 1 g/50 mL premix        1 g 100 mL/hr over 30 Minutes Intravenous Every 6 hours 12/11/11 1805 12/12/11 0745   12/11/11 1100   gentamicin (GARAMYCIN) 80 mg in sodium chloride irrigation 0.9 % 500 mL irrigation  Status:  Discontinued        80 mg Irrigation On call 12/11/11 1013 12/11/11 1804   12/11/11 1100   ceFAZolin (ANCEF) IVPB 2 g/50 mL premix  Status:  Discontinued  2 g 100 mL/hr over 30 Minutes Intravenous On call 12/11/11 1013 12/11/11 1804          Assessment/Plan: s/p Procedure(s): PERMANENT PACEMAKER INSERTION Fall Multiple R rib Fx/pulmonary contusion/PTX - continue pulmonary toilet, did 1250 IS, no PTX on CXR 2nd degree AV block - S/P pacer, appreciate cardiology assistance PT/OT FEN - tol diet VTE - PAS, Hb dropped some - recheck AM To tele  LOS: 2 days    Violeta Gelinas, MD, MPH, FACS Pager: [phone]  12/12/2011

## 2011-12-12 NOTE — Progress Notes (Signed)
Subjective:  No complaints  Objective:  Vital Signs in the last 24 hours: Temp:  [98 F (36.7 C)-99.1 F (37.3 C)] 98.1 F (36.7 C) (06/06 0400) Pulse Rate:  [71-82] 72  (06/06 0600) Resp:  [13-20] 15  (06/06 0600) BP: (112-154)/(40-60) 123/48 mmHg (06/06 0600) SpO2:  [93 %-98 %] 94 % (06/06 0734) Weight:  [87 kg (191 lb 12.8 oz)] 87 kg (191 lb 12.8 oz) (06/06 0528)  Intake/Output from previous day:  Intake/Output Summary (Last 24 hours) at 12/12/11 0745 Last data filed at 12/12/11 0600  Gross per 24 hour  Intake 2325.5 ml  Output   1975 ml  Net  350.5 ml    Physical Exam: General appearance: alert, cooperative and no distress Lungs: clear to auscultation bilaterally Heart: regular rate and rhythm Pacer site without hematoma   Rate: 78  Rhythm: normal sinus rhythm  Lab Results:  Basename 12/12/11 0412 12/11/11 0350  WBC 10.4 13.3*  HGB 11.8* 13.5  PLT 135* 147*    Basename 12/12/11 0412 12/11/11 0350  NA 137 137  K 4.3 4.5  CL 103 102  CO2 25 25  GLUCOSE 144* 192*  BUN 19 26*  CREATININE 0.90 0.84    Basename 12/11/11 0856 12/10/11 2346  TROPONINI <0.30 <0.30   Hepatic Function Panel No results found for this basename: PROT,ALBUMIN,AST,ALT,ALKPHOS,BILITOT,BILIDIR,IBILI in the last 72 hours No results found for this basename: CHOL in the last 72 hours  Basename 12/11/11 1209  INR 1.18    Imaging: Imaging results have been reviewed  Cardiac Studies:  Assessment/Plan:   Principal Problem:  *Ribs, multiple fractures, right, closed, initial encounter Active Problems:  Bradycardia  Pneumothorax, right  HTN (hypertension)   Plan- we will arrange follow up in one week for pacer site check. OK for discharge from our standpoint.   Corine Shelter PA-C 12/12/2011, 7:45 AM    Agree with note written by Corine Shelter [facility]  S/P multiple chest trauma secondary to falling off of a ladder, fractured ribs on right. CHB noted on monitor. ? Related to fall  or secondary to pain and vagal reflex. Pt got PTVPM yesterday. Rare pacing. CXR pending. Interrogated by rep this am. OK for D/C home anytime from our standpoint. ROV with Dr. Salena Saner in our [place] office 1-2 weeks and with me in 3 months.  Runell Gess 12/12/2011 7:49 AM

## 2011-12-12 NOTE — Progress Notes (Signed)
Orthopedic Tech Progress Note Patient Details:  Chad Martinez May 14, 1935 [id number]      [name] 12/12/2011, 8:17 AM Pt had arm sling

## 2011-12-13 LAB — CBC
Hemoglobin: 11 g/dL — ABNORMAL LOW (ref 13.0–17.0)
MCHC: 34.3 g/dL (ref 30.0–36.0)
RBC: 3.64 MIL/uL — ABNORMAL LOW (ref 4.22–5.81)

## 2011-12-13 MED ORDER — YOU HAVE A PACEMAKER BOOK
Freq: Once | Status: DC
  Filled 2011-12-13: qty 1

## 2011-12-13 MED ORDER — OXYCODONE HCL 5 MG PO TABS: 5.0000 mg | ORAL_TABLET | ORAL | Status: AC | PRN

## 2011-12-13 NOTE — Clinical Social Work Note (Signed)
Clinical Social Worker spoke with patient briefly at bedside to confirm there were no further social work needs prior to discharge.  Patient appreciative of follow up visit but kindly declined any further resources.  Patient wife is present for transportation home.  Patient with supportive family to assist at discharge.  Per ED CSW note/assessment, patient with no current drug/alcohol use - SBIRT complete.  Clinical Social Worker will sign off for now as social work intervention is no longer needed. Please consult [place] again if new need arises.  [address] [place], [place] [phone]

## 2011-12-13 NOTE — Progress Notes (Signed)
Physical Therapy Treatment Patient Details Name: Chad Martinez MRN: [id number] DOB: [birth year] Today's Date: 12/13/2011 Time: 4098-1191 PT Time Calculation (min): 23 min  PT Assessment / Plan / Recommendation Comments on Treatment Session  Pt s/p fall with right rib fx, pacemaker and increased mobility today. Pt with excellent improvement and all education provided. Wife will be at home with pt to assist with any bathing or dressing needs and pt with walk in shower with seat at home and educated for donning shirts with pacemaker restrictions. Pt and wife agreeable to no further HHPT needs but would prefer to keep RW for safety. Will follow acutely but feel pt is safe to manage at home with spouse.     Follow Up Recommendations  No PT follow up    Barriers to Discharge        Equipment Recommendations  Rolling walker with 5" wheels    Recommendations for Other Services Other (comment) (none)  Frequency     Plan Discharge plan needs to be updated;Frequency remains appropriate    Precautions / Restrictions Precautions Precautions: ICD/Pacemaker   Pertinent Vitals/Pain Pain 5/10 with bed mobility but none before or after, premedicated    Mobility  Bed Mobility Bed Mobility: Rolling Right;Right Sidelying to Sit;Sit to Supine Rolling Right: 5: Supervision Right Sidelying to Sit: 5: Supervision;HOB flat Sit to Supine: 6: Modified independent (Device/Increase time);HOB flat Details for Bed Mobility Assistance: cueing for sequence as pt able to descend to bed from sitting to supine but required cues to roll to Right and push with RUE for OOB Transfers Transfers: Sit to Stand;Stand to Sit Sit to Stand: 6: Modified independent (Device/Increase time);From bed;From chair/3-in-1 Stand to Sit: 6: Modified independent (Device/Increase time);To chair/3-in-1;To bed Ambulation/Gait Ambulation/Gait Assistance: 6: Modified independent (Device/Increase time) Ambulation Distance (Feet): 450  Feet Assistive device: None Ambulation/Gait Assistance Details: decreased speed with head down to the floor and cueing to look up  Gait Pattern: Step-through pattern;Decreased stride length Gait velocity: 33ft/31 sec=.967 which places pt at increased risk for fall Stairs: Yes Stairs Assistance: 4: Min guard Stair Management Technique: No rails Number of Stairs: 1     Exercises     PT Diagnosis:    PT Problem List:   PT Treatment Interventions:     PT Goals Acute Rehab PT Goals Pt will go Supine/Side to Sit: with modified independence PT Goal: Supine/Side to Sit - Progress: Goal set today PT Goal: Sit to Supine/Side - Progress: Met PT Goal: Sit to Stand - Progress: Met PT Goal: Stand to Sit - Progress: Met PT Goal: Ambulate - Progress: Met Pt will Go Up / Down Stairs: with modified independence PT Goal: Up/Down Stairs - Progress: Goal set today  Visit Information  Last PT Received On: 12/13/11 Assistance Needed: +1    Subjective Data  Subjective: you didn't expect me to do this good did you?   Cognition  Overall Cognitive Status: Appears within functional limits for tasks assessed/performed Arousal/Alertness: Awake/alert Orientation Level: Appears intact for tasks assessed Behavior During Session: [facility] for tasks performed    Balance  Static Sitting Balance Static Sitting - Balance Support: No upper extremity supported;Feet supported Static Sitting - Level of Assistance: 7: Independent Static Sitting - Comment/# of Minutes: 2  End of Session PT - End of Session Activity Tolerance: Patient tolerated treatment well Patient left: in chair;with call bell/phone within reach;with family/visitor present Nurse Communication: Mobility status    Delorse Lek 12/13/2011, 8:36 AM Delaney Meigs, PT [phone]

## 2011-12-13 NOTE — Discharge Instructions (Addendum)
Pacemaker Implantation A pacemaker (pacer) is a small device that acts as a backup or takes over for the natural pacemaker of the heart. The heart has its own electrical system to regulate the beating of the heart muscles. The heart pumps best when it beats in a regular, coordinated rhythm.  A pacer consists of a small device (generator) which produces electrical signals that tell your heart to beat. The generator contains a lithium battery and a tiny computer. Wires (leads) connect the generator to the heart. The pacer is placed under the skin through a small cut. It senses every heartbeat and only fires when the heart rate falls outside certain levels. When the pacer triggers a heart beat, it is called "capture." PROBLEMS THAT MAY BE HELPED BY A PACEMAKER:  Your heart rate is sometimes too slow or irregular.   Fainting, dizziness, weakness or confusion as a result of low blood flow.   Shortness of breath.   Chest pain or angina if the heart needs more blood and oxygen.   Disturbed sleep as a result of abnormal heart rhythm.   Palpitations or the feeling that the heart is beating too fast, too hard or in an irregular way.   Weak heart muscle pumping ability.  PROCEDURE   The pacer may be placed under the skin near the collarbone, while you are under sedation. An abdominal wall location may be another option.   The leads are inserted into a vein that lies just under the collarbone, then guided into place under x-ray. The tips of the wires touch the inside of the heart. The near end of the pacer wires are connected to the generator under the skin.   For individuals with thinner chest walls, it may be possible to feel the device under the skin, and a slight bump may be seen.  HOME CARE INSTRUCTIONS   Keep the incision dry for a week after the procedure.   For about 8 weeks, avoid sudden or jerky movements that pull your arm away from your body. This could change the position of the leads.    Take medicine exactly as directed.   Learn how to check your pulse. Follow directions about when to call or be concerned.   Be physically active every day. Ask your caregiver how and when to increase activity.   Household appliances do not interfere with pacemakers.   Travel by car, train or airplane should not be a problem. Carry a pacemaker ID card in case the device sets off a metal detector.  PACEMAKER CARE:  Avoid putting pressure over the area where the pacer was put in.   Digital cell phones should be kept 12 inches away from the pacemaker. Hold them at the ear on the side opposite of the pacer.   Never leave a cell phone in a pocket over the pacemaker.   Avoid strong electro-magnetic fields. You will not be able to have an MRI scan because of the strong magnets.   Pacer batteries last about 5 years and give off warning signals when they are running low on power. Pacers may be checked every 3 months. This allows plenty of time to change the generator when it is running low on power.   Changing the battery means removing the old generator through the same cut and plugging the existing wires into the new generator.   An EKG or heart monitor is used to see if your pacer is working properly. Sometimes signals may be sent over  a land line phone to your clinic.   Tell emergency responders that you have a pacemaker.  SEEK MEDICAL CARE IF:  You begin to gain weight and your feet and ankles swell.   You have dizzy spells or feel weak.   Your pulse rate drops below the limit or is too fast.  SEEK IMMEDIATE MEDICAL CARE IF:  You faint or pass out.   You have chest pain or shortness of breath.   You are injured and think your pacemaker may have been damaged.   You are suddenly very tired or have pain in your back.   You are worried that your heart is not beating right or cannot feel your pulse.  Document Released: 06/14/2002 Document Revised: 06/13/2011 Document Reviewed:  08/21/2007 ExitCare Patient Information 2012 [place], [facility]  Supplemental Discharge Instructions for  Pacemaker/Defibrillator Patients  Activity Do not raise your left/right arm above shoulder level or extend it backward beyond shoulder level for 2 weeks. Wear the arm sling as a reminder or as needed for comfort for 2 weeks. No heavy lifting or vigorous activity with your left/right arm for 6-8 weeks.    NO DRIVING is preferable for 2 weeks; If absolutely necessary, drive only short, familiar routes. DO wear your seatbelt, even if it crosses over the pacemaker site.  WOUND CARE   Keep the wound area clean and dry.  Remove the dressing the day after you return home (usually 48 hours after the procedure).   DO NOT SUBMERGE UNDER WATER UNTIL FULLY HEALED (no tub baths, hot tubs, swimming pools, etc.).    You  may shower or take a sponge bath after the dressing is removed. DO NOT SOAK the area and do not allow the shower to directly spray on the site.   If you have staples, these will be removed in the office in 7-14 days.   If you have tape/steri-strips on your wound, these will fall off; do not pull them off prematurely.     No bandage is needed on the site.  DO  NOT apply any creams, oils, or ointments to the wound area.   If you notice any drainage or discharge from the wound, any swelling, excessive redness or bruising at the site, or if you develop a fever > 101? F after you are discharged home, call the office at once.  Special Instructions   You are still able to use cellular telephones.  Avoid carrying your cellular phone near your device.   When traveling through airports, show security personnel your identification card to avoid being screened in the metal detectors.    Avoid arc welding equipment, MRI testing (magnetic resonance imaging), TENS units (transcutaneous nerve stimulators).  Call the office for questions about other devices.   Avoid electrical appliances that are in poor  condition or are not properly grounded.   Microwave ovens are safe to be near or to operate.  Additional information for defibrillator patients should your device go off:   If your device goes off ONCE and you feel fine afterward, notify the clinic at [phone].   If your device goes off ONCE and you do not feel well afterward, call 911.   If your device goes off TWICE or more in one day, call 911.  DO NOT DRIVE YOURSELF OR A FAMILY MEMBER WITH A DEFIBRILLATOR TO THE HOSPITAL--CALL 911.

## 2011-12-13 NOTE — Discharge Summary (Signed)
Physician Discharge Summary  Patient ID: Chad Martinez MRN: [id number] DOB/AGE: 1934/09/09 77 y.o.  Admit date: 12/10/2011 Discharge date: 12/13/2011  Admission Diagnoses: Multiple right-sided rib fractures, right pulmonary contusion, right pneumothorax, second-degree heart block with bradycardia  Discharge Diagnoses: Multiple right-sided rib fractures, right pulmonary contusion, right pneumothorax, second-degree heart block with bradycardia Principal Problem:  *Ribs, multiple fractures, right, closed, initial encounter Active Problems:  Bradycardia  Pneumothorax, right  HTN (hypertension)   Discharged Condition: good  Hospital Course: Patient was admitted after a fall. He was up on a ladder cleaning the roof of his shop on his property. He fell. He suffered multiple right-sided rib fractures, pulmonary contusion, and occult pneumothorax. Shortly after admission he began to have problems with bradycardia. He was seen by [facility]. He was noted to have second-degree heart block and a pacemaker was placed. He continued with pulmonary toilet. He improved significantly. He had good pain control. Physical therapy cleared him for discharge with a rolling walker. He is discharged home in stable condition.  Consults: cardiology  Significant Diagnostic Studies: See labs and x-rays  Treatments: Pacemaker placed  Discharge Exam: Blood pressure 139/85, pulse 69, temperature 98.3 F (36.8 C), temperature source Oral, resp. rate 20, height 5\' 8"  (1.727 m), weight 88.2 kg (194 lb 7.1 oz), SpO2 95.00%. See daily progress note from today  Disposition:  home   Medication List  As of 12/13/2011  8:56 AM   ASK your doctor about these medications         aspirin EC 81 MG tablet   Take 81 mg by mouth daily.      metoprolol tartrate 25 MG tablet   Commonly known as: LOPRESSOR   Take 25 mg by mouth 2 (two) times daily.      simvastatin 80 MG tablet   Commonly known as: ZOCOR   Take 80 mg by mouth at bedtime.           Follow-up Information    Follow up with Thurmon Fair, MD on 12/20/2011. (4:15pm  in [place])    Contact information:   [address] [place] [address] [phone]       Follow up with [facility] on 12/19/2011. (3:30pm Call [phone] with questions, see card)          Signed: Violeta Gelinas E 12/13/2011, 8:56 AM

## 2011-12-13 NOTE — Progress Notes (Signed)
Patient ID: KINO DUNSWORTH, male   DOB: 12-26-1934, 77 y.o.   MRN: [id number] 2 Days Post-Op  Subjective: Pain significantly improved, no shortness of breath  Objective: Vital signs in last 24 hours: Temp:  [97.3 F (36.3 C)-98.9 F (37.2 C)] 98.3 F (36.8 C) (06/07 0826) Pulse Rate:  [69-85] 69  (06/07 0830) Resp:  [14-20] 20  (06/07 0826) BP: (127-153)/(50-85) 139/85 mmHg (06/07 0826) SpO2:  [91 %-99 %] 95 % (06/07 0826) Weight:  [88.2 kg (194 lb 7.1 oz)] 88.2 kg (194 lb 7.1 oz) (06/07 0015) Last BM Date: 12/10/11  Intake/Output from previous day: 06/06 0701 - 06/07 0700 In: 360 [P.O.:360] Out: -  Intake/Output this shift:    General appearance: alert, cooperative and no distress Resp: clear to auscultation bilaterally Chest wall: right sided chest wall tenderness, pacer L cheat Cardio: regular, occasional paced beats GI: soft. NT, ND, +BS  Lab Results: CBC   Basename 12/13/11 0405 12/12/11 0412  WBC 9.6 10.4  HGB 11.0* 11.8*  HCT 32.1* 34.3*  PLT 120* 135*   BMET  Basename 12/12/11 0412 12/11/11 0350  NA 137 137  K 4.3 4.5  CL 103 102  CO2 25 25  GLUCOSE 144* 192*  BUN 19 26*  CREATININE 0.90 0.84  CALCIUM 8.2* 8.4   PT/INR  Basename 12/11/11 1209  LABPROT 15.2  INR 1.18   ABG No results found for this basename: PHART:2,PCO2:2,PO2:2,HCO3:2 in the last 72 hours  Studies/Results: Dg Chest Port 1 View  12/12/2011  *RADIOLOGY REPORT*  Clinical Data: Follow up right pneumothorax  PORTABLE CHEST - 1 VIEW  Comparison: 12/11/2011  Findings: The cardiomediastinal silhouette is stable.  Status post median sternotomy again noted.  There is a dual lead cardiac pacemaker with leads in the right atrium and right ventricle.  No diagnostic pneumothorax.  Stable right apical opacity probable right apical contusion.  Previous right upper rib fractures noted on the CT scan are not clearly identified on x-ray.  No right apical diagnostic pneumothorax.  IMPRESSION:  Status  post median sternotomy again noted.  There is a dual lead cardiac pacemaker with leads in the right atrium and right ventricle.  No diagnostic pneumothorax.  Stable right apical opacity probable right apical contusion.  Previous right upper rib fractures noted on the CT scan are not clearly identified on x-ray.  Original Report Authenticated By: Natasha Mead, M.D.    Anti-infectives: Anti-infectives     Start     Dose/Rate Route Frequency Ordered Stop   12/11/11 1900   ceFAZolin (ANCEF) IVPB 1 g/50 mL premix        1 g 100 mL/hr over 30 Minutes Intravenous Every 6 hours 12/11/11 1805 12/12/11 0745   12/11/11 1100   gentamicin (GARAMYCIN) 80 mg in sodium chloride irrigation 0.9 % 500 mL irrigation  Status:  Discontinued        80 mg Irrigation On call 12/11/11 1013 12/11/11 1804   12/11/11 1100   ceFAZolin (ANCEF) IVPB 2 g/50 mL premix  Status:  Discontinued        2 g 100 mL/hr over 30 Minutes Intravenous On call 12/11/11 1013 12/11/11 1804          Assessment/Plan: s/p Procedure(s): PERMANENT PACEMAKER INSERTION Fall Multiple R rib Fx/pulmonary contusion/PTX - continues to improve 2nd degree AV block - S/P pacer, appreciate cardiology assistance PT - cleared for D/C with RW FEN - tol diet VTE - PAS Anemia - mild D/C home - plan D/W patient  and wife  LOS: 3 days    Violeta Gelinas, MD, MPH, FACS Pager: [phone]  12/13/2011

## 2011-12-13 NOTE — Plan of Care (Signed)
Problem: Phase I Progression Outcomes Goal: OOB as tolerated unless otherwise ordered Outcome: Completed/Met Date Met:  12/13/11 Patient OOB using walker to ambulate, working with PT Goal: Initial discharge plan identified Outcome: Completed/Met Date Met:  12/13/11 Planned discharge soon  Problem: Phase II Progression Outcomes Goal: Progress activity as tolerated unless otherwise ordered Outcome: Completed/Met Date Met:  12/13/11 Progressing with activity with PT using walker. Goal: Discharge plan established Outcome: Completed/Met Date Met:  12/13/11 Discharge plan established Goal: Vital signs remain stable Outcome: Completed/Met Date Met:  12/13/11 VSS cardiac monitor shows SR with no ectopy noted today.  Goal: IV changed to normal saline lock Outcome: Completed/Met Date Met:  12/13/11 Saline locked IV, patient tolerating regular diet and fluids Goal: Obtain order to discontinue catheter if appropriate Outcome: Completed/Met Date Met:  12/13/11 No catheter voiding well   Problem: Phase III Progression Outcomes Goal: Pain controlled on oral analgesia Outcome: Completed/Met Date Met:  12/13/11 Patient tolerating right lateral chest discomfort on prn oxycodone.  Goal: Activity at appropriate level-compared to baseline (UP IN CHAIR FOR HEMODIALYSIS)  Outcome: Completed/Met Able to ambulate using

## 2011-12-13 NOTE — Progress Notes (Signed)
Subjective:  No CP/SOB. Only complaint is right lateral CP at site of rib fracture  Objective:  Temp:  [97.3 F (36.3 C)-98.9 F (37.2 C)] 98.2 F (36.8 C) (06/07 0545) Pulse Rate:  [70-84] 73  (06/07 0545) Resp:  [14-20] 20  (06/07 0545) BP: (127-153)/(50-59) 131/53 mmHg (06/07 0545) SpO2:  [91 %-99 %] 91 % (06/07 0545) Weight:  [88.2 kg (194 lb 7.1 oz)] 88.2 kg (194 lb 7.1 oz) (06/07 0015) Weight change: 1.2 kg (2 lb 10.3 oz)  Intake/Output from previous day: 06/06 0701 - 06/07 0700 In: 360 [P.O.:360] Out: -   Intake/Output from this shift:    Physical Exam: General appearance: alert and cooperative Neck: no adenopathy, no carotid bruit, no JVD, supple, symmetrical, trachea midline and thyroid not enlarged, symmetric, no tenderness/mass/nodules Lungs: clear to auscultation bilaterally Heart: regular rate and rhythm, S1, S2 normal, no murmur, click, rub or gallop Extremities: extremities normal, atraumatic, no cyanosis or edema  Lab Results: Results for orders placed during the hospital encounter of 12/10/11 (from the past 48 hour(s))  CARDIAC PANEL(CRET KIN+CKTOT+MB+TROPI)     Status: Abnormal   Collection Time   12/11/11  8:56 AM      Component Value Range Comment   Total CK 1990 (*) 7 - 232 (U/L)    CK, MB 19.2 (*) 0.3 - 4.0 (ng/mL) CRITICAL VALUE NOTED.  VALUE IS CONSISTENT WITH PREVIOUSLY REPORTED AND CALLED VALUE.   Troponin I <0.30  <0.30 (ng/mL)    Relative Index 1.0  0.0 - 2.5    PROTIME-INR     Status: Normal   Collection Time   12/11/11 12:09 PM      Component Value Range Comment   Prothrombin Time 15.2  11.6 - 15.2 (seconds)    INR 1.18  0.00 - 1.49    CBC     Status: Abnormal   Collection Time   12/12/11  4:12 AM      Component Value Range Comment   WBC 10.4  4.0 - 10.5 (K/uL)    RBC 3.92 (*) 4.22 - 5.81 (MIL/uL)    Hemoglobin 11.8 (*) 13.0 - 17.0 (g/dL)    HCT 96.0 (*) 45.4 - 52.0 (%)    MCV 87.5  78.0 - 100.0 (fL)    MCH 30.1  26.0 - 34.0 (pg)    MCHC 34.4  30.0 - 36.0 (g/dL)    RDW 09.8  11.9 - 14.7 (%)    Platelets 135 (*) 150 - 400 (K/uL)   BASIC METABOLIC PANEL     Status: Abnormal   Collection Time   12/12/11  4:12 AM      Component Value Range Comment   Sodium 137  135 - 145 (mEq/L)    Potassium 4.3  3.5 - 5.1 (mEq/L)    Chloride 103  96 - 112 (mEq/L)    CO2 25  19 - 32 (mEq/L)    Glucose, Bld 144 (*) 70 - 99 (mg/dL)    BUN 19  6 - 23 (mg/dL)    Creatinine, Ser 8.29  0.50 - 1.35 (mg/dL)    Calcium 8.2 (*) 8.4 - 10.5 (mg/dL)    GFR calc non Af Amer 82 (*) >90 (mL/min)    GFR calc Af Amer >90  >90 (mL/min)   GLUCOSE, CAPILLARY     Status: Abnormal   Collection Time   12/12/11  1:14 PM      Component Value Range Comment   Glucose-Capillary 186 (*) 70 - 99 (mg/dL)  Comment 1 Notify RN     CBC     Status: Abnormal   Collection Time   12/13/11  4:05 AM      Component Value Range Comment   WBC 9.6  4.0 - 10.5 (K/uL)    RBC 3.64 (*) 4.22 - 5.81 (MIL/uL)    Hemoglobin 11.0 (*) 13.0 - 17.0 (g/dL)    HCT 16.1 (*) 09.6 - 52.0 (%)    MCV 88.2  78.0 - 100.0 (fL)    MCH 30.2  26.0 - 34.0 (pg)    MCHC 34.3  30.0 - 36.0 (g/dL)    RDW 04.5  40.9 - 81.1 (%)    Platelets 120 (*) 150 - 400 (K/uL)     Imaging: Imaging results have been reviewed  Assessment/Plan:   1. Principal Problem: 2.  *Ribs, multiple fractures, right, closed, initial encounter 3. Active Problems: 4.  Bradycardia 5.  Pneumothorax, right 6.  HTN (hypertension) 7.   Time Spent Directly with Patient:  20 minutes  Length of Stay:  LOS: 3 days   Pacer site clean and dry. Occasional paced beat. Walked with PT. Home per Trauma Surgery. Follow up with Dr. Salena Saner 2 weeks for removal of staples and pacer check.  Niesha Bame J 12/13/2011, 8:01 AM

## 2011-12-17 ENCOUNTER — Telehealth: Payer: Self-pay | Admitting: Orthopedic Surgery

## 2011-12-17 NOTE — Telephone Encounter (Signed)
Confirmed appt for 2:30 on 613

## 2011-12-19 ENCOUNTER — Ambulatory Visit (INDEPENDENT_AMBULATORY_CARE_PROVIDER_SITE_OTHER): Payer: Medicare Other | Admitting: Orthopedic Surgery

## 2011-12-19 ENCOUNTER — Encounter (INDEPENDENT_AMBULATORY_CARE_PROVIDER_SITE_OTHER): Payer: Self-pay

## 2011-12-19 VITALS — BP 128/60 | HR 70 | Temp 97.4°F | Resp 14 | Ht 68.0 in | Wt 190.5 lb

## 2011-12-19 DIAGNOSIS — S2249XA Multiple fractures of ribs, unspecified side, initial encounter for closed fracture: Secondary | ICD-10-CM

## 2011-12-19 MED ORDER — NAPROXEN 500 MG PO TABS: 500.0000 mg | ORAL_TABLET | Freq: Two times a day (BID) | ORAL | Status: DC

## 2011-12-19 NOTE — Progress Notes (Signed)
Subjective Patient comes in s/p multiple rib fxs with PTX on right. He's doing well but still quite sore on right side. Denies SOB.   Objective Lungs: Slightly decreased on right Abd: Soft, NT, +BS CV: RRR Left thigh: Large medial/posterior ecchymosis, NT, soft   Assessment & Plan Multiple right rib fractures w/PTX -- Increase activity as tolerated. Will start NSAID for next two months. Left thigh ecchymosis -- I suspect this was due to direct trauma. It should resolve without incident.

## 2012-01-03 ENCOUNTER — Telehealth: Payer: Self-pay | Admitting: Orthopedic Surgery

## 2012-01-03 MED ORDER — HYDROCODONE-ACETAMINOPHEN 5-325 MG PO TABS: 1.0000 | ORAL_TABLET | ORAL | Status: AC | PRN

## 2012-01-03 NOTE — Telephone Encounter (Signed)
Pt's wife called to get refill on pain medication. Will call in rx for Norco. Authorized by Dr. Janee Morn.

## 2012-02-04 DIAGNOSIS — Z45018 Encounter for adjustment and management of other part of cardiac pacemaker: Secondary | ICD-10-CM | POA: Diagnosis not present

## 2012-02-04 DIAGNOSIS — I495 Sick sinus syndrome: Secondary | ICD-10-CM | POA: Diagnosis not present

## 2012-03-03 DIAGNOSIS — I10 Essential (primary) hypertension: Secondary | ICD-10-CM | POA: Diagnosis not present

## 2012-03-03 DIAGNOSIS — I259 Chronic ischemic heart disease, unspecified: Secondary | ICD-10-CM | POA: Diagnosis not present

## 2012-03-03 DIAGNOSIS — E785 Hyperlipidemia, unspecified: Secondary | ICD-10-CM | POA: Diagnosis not present

## 2012-03-03 DIAGNOSIS — M25539 Pain in unspecified wrist: Secondary | ICD-10-CM | POA: Diagnosis not present

## 2012-04-07 DIAGNOSIS — Z23 Encounter for immunization: Secondary | ICD-10-CM | POA: Diagnosis not present

## 2012-04-19 HISTORY — PX: CORONARY ARTERY BYPASS GRAFT: SHX141

## 2012-05-02 DIAGNOSIS — R55 Syncope and collapse: Secondary | ICD-10-CM | POA: Diagnosis not present

## 2012-05-02 DIAGNOSIS — I495 Sick sinus syndrome: Secondary | ICD-10-CM | POA: Diagnosis not present

## 2012-05-02 DIAGNOSIS — I441 Atrioventricular block, second degree: Secondary | ICD-10-CM | POA: Diagnosis not present

## 2012-08-08 DIAGNOSIS — I441 Atrioventricular block, second degree: Secondary | ICD-10-CM | POA: Diagnosis not present

## 2012-08-08 DIAGNOSIS — I495 Sick sinus syndrome: Secondary | ICD-10-CM | POA: Diagnosis not present

## 2012-08-08 DIAGNOSIS — R55 Syncope and collapse: Secondary | ICD-10-CM | POA: Diagnosis not present

## 2012-09-24 ENCOUNTER — Other Ambulatory Visit (HOSPITAL_COMMUNITY): Payer: Self-pay | Admitting: Cardiovascular Disease

## 2012-09-24 DIAGNOSIS — I25810 Atherosclerosis of coronary artery bypass graft(s) without angina pectoris: Secondary | ICD-10-CM

## 2012-10-02 ENCOUNTER — Ambulatory Visit (HOSPITAL_COMMUNITY)
Admission: RE | Admit: 2012-10-02 | Discharge: 2012-10-02 | Disposition: A | Discharge status: Home / Self Care | Payer: Medicare Other | Source: Ambulatory Visit | Attending: Cardiovascular Disease | Admitting: Cardiovascular Disease

## 2012-10-02 DIAGNOSIS — I10 Essential (primary) hypertension: Secondary | ICD-10-CM | POA: Diagnosis not present

## 2012-10-02 DIAGNOSIS — I25810 Atherosclerosis of coronary artery bypass graft(s) without angina pectoris: Secondary | ICD-10-CM

## 2012-10-02 DIAGNOSIS — E663 Overweight: Secondary | ICD-10-CM | POA: Insufficient documentation

## 2012-10-02 DIAGNOSIS — Z87891 Personal history of nicotine dependence: Secondary | ICD-10-CM | POA: Insufficient documentation

## 2012-10-02 DIAGNOSIS — Z951 Presence of aortocoronary bypass graft: Secondary | ICD-10-CM | POA: Diagnosis not present

## 2012-10-02 MED ORDER — REGADENOSON 0.4 MG/5ML IV SOLN
0.4000 mg | Freq: Once | INTRAVENOUS | Status: AC
  Administered 2012-10-02: 0.4 mg via INTRAVENOUS

## 2012-10-02 MED ORDER — TECHNETIUM TC 99M SESTAMIBI GENERIC - CARDIOLITE
10.0000 | Freq: Once | INTRAVENOUS | Status: AC | PRN
  Administered 2012-10-02: 10 via INTRAVENOUS

## 2012-10-02 MED ORDER — TECHNETIUM TC 99M SESTAMIBI GENERIC - CARDIOLITE
30.0000 | Freq: Once | INTRAVENOUS | Status: AC | PRN
  Administered 2012-10-02: 30 via INTRAVENOUS

## 2012-10-02 NOTE — Procedures (Addendum)
[facility] [address] [place] [address] [phone]  Cardiology Nuclear Med Study  Chad Martinez is a 78 y.o. male     MRN : [id number]     DOB: [birth year]  Procedure Date: 10/02/2012  Nuclear Med Background Indication for Stress Test:  Graft Patency History:  CAD;CABG X6-2009;PACEMAKER Cardiac Risk Factors: Family History - CAD, History of Smoking, Hypertension and Overweight  Symptoms:  PT DENIES BEING SYMPTOMATIC   Nuclear Pre-Procedure Caffeine/Decaff Intake:  1:00am NPO After: 11 AM   IV Site: R Forearm  IV 0.9% NS with Angio Cath:  22g  Chest Size (in):  46" IV Started by: Emmit Pomfret, RN  Height: 5\' 8"  (1.727 m)  Cup Size: n/a  BMI:  Body mass index is 28.9 kg/(m^2). Weight:  190 lb (86.183 kg)   Tech Comments:  N/A    Nuclear Med Study 1 or 2 day study: 1 day  Stress Test Type:  Lexiscan  Order Authorizing Provider:  Obie Dredge   Resting Radionuclide: Technetium 45m Sestamibi  Resting Radionuclide Dose: 10.3 mCi   Stress Radionuclide:  Technetium 72m Sestamibi  Stress Radionuclide Dose: 31.2 mCi           Stress Protocol Rest HR: 73 Stress HR: 83  Rest BP: 174/89 Stress BP: 179/79  Exercise Time (min): n/a METS: n/a   Predicted Max HR: 145 bpm % Max HR: 57.24 bpm Rate Pressure Product: 21308  Dose of Adenosine (mg):  n/a Dose of Lexiscan: 0.4 mg  Dose of Atropine (mg): n/a Dose of Dobutamine: n/a mcg/kg/min (at max HR)  Stress Test Technologist: Esperanza Sheets, CCT Nuclear Technologist: Koren Shiver, CNMT   Rest Procedure:  Myocardial perfusion imaging was performed at rest 45 minutes following the intravenous administration of Technetium 55m Sestamibi. Stress Procedure:  The patient received IV Lexiscan 0.4 mg over 15-seconds.  Technetium 93m Sestamibi injected at 30-seconds.  There were no significant changes with Lexiscan.  Quantitative spect images were obtained after a 45 minute  delay.  Transient Ischemic Dilatation (Normal <1.22):  0.84 Lung/Heart Ratio (Normal <0.45):  0.26 QGS EDV:  61 ml QGS ESV:  21 ml LV Ejection Fraction: 65%  Signed by      Rest ECG: NSR - Normal EKG  Stress ECG: No significant change from baseline ECG  QPS Raw Data Images:  Normal; no motion artifact; normal heart/lung ratio. Stress Images:  Normal homogeneous uptake in all areas of the myocardium. Rest Images:  Normal homogeneous uptake in all areas of the myocardium. Subtraction (SDS):  Normal  Impression Exercise Capacity:  Lexiscan with no exercise. BP Response:  Normal blood pressure response. Clinical Symptoms:  No significant symptoms noted. ECG Impression:  No significant ST segment change suggestive of ischemia. Comparison with Prior Nuclear Study: No images to compare  Overall Impression:  Normal stress nuclear study. Low risk stress nuclear study.  LV Wall Motion:  NL LV Function, EF 65%; NL Wall Motion   Kristian Hazzard A, MD  10/02/2012 3:31 PM

## 2012-10-12 DIAGNOSIS — I10 Essential (primary) hypertension: Secondary | ICD-10-CM | POA: Diagnosis not present

## 2012-10-12 DIAGNOSIS — I2510 Atherosclerotic heart disease of native coronary artery without angina pectoris: Secondary | ICD-10-CM | POA: Diagnosis not present

## 2012-10-12 DIAGNOSIS — E782 Mixed hyperlipidemia: Secondary | ICD-10-CM | POA: Diagnosis not present

## 2012-10-12 DIAGNOSIS — R55 Syncope and collapse: Secondary | ICD-10-CM | POA: Diagnosis not present

## 2012-10-15 ENCOUNTER — Telehealth: Payer: Self-pay

## 2012-10-15 ENCOUNTER — Telehealth: Payer: Self-pay | Admitting: Family Medicine

## 2012-10-15 DIAGNOSIS — E119 Type 2 diabetes mellitus without complications: Secondary | ICD-10-CM

## 2012-10-15 DIAGNOSIS — E782 Mixed hyperlipidemia: Secondary | ICD-10-CM

## 2012-10-15 DIAGNOSIS — E785 Hyperlipidemia, unspecified: Secondary | ICD-10-CM

## 2012-10-15 DIAGNOSIS — Z79899 Other long term (current) drug therapy: Secondary | ICD-10-CM

## 2012-10-15 DIAGNOSIS — Z125 Encounter for screening for malignant neoplasm of prostate: Secondary | ICD-10-CM

## 2012-10-15 NOTE — Telephone Encounter (Signed)
error 

## 2012-10-15 NOTE — Telephone Encounter (Signed)
Patient needs BW paperwork °

## 2012-10-16 DIAGNOSIS — E785 Hyperlipidemia, unspecified: Secondary | ICD-10-CM | POA: Diagnosis not present

## 2012-10-16 DIAGNOSIS — Z79899 Other long term (current) drug therapy: Secondary | ICD-10-CM | POA: Diagnosis not present

## 2012-10-16 DIAGNOSIS — Z125 Encounter for screening for malignant neoplasm of prostate: Secondary | ICD-10-CM | POA: Diagnosis not present

## 2012-10-16 DIAGNOSIS — E119 Type 2 diabetes mellitus without complications: Secondary | ICD-10-CM | POA: Diagnosis not present

## 2012-10-16 NOTE — Telephone Encounter (Signed)
Lipid liver met 7 A1c PSA

## 2012-10-16 NOTE — Telephone Encounter (Signed)
Blood work papers printed and left up front for patient. Patient notified. 

## 2012-10-19 DIAGNOSIS — Z125 Encounter for screening for malignant neoplasm of prostate: Secondary | ICD-10-CM | POA: Diagnosis not present

## 2012-10-19 DIAGNOSIS — Z79899 Other long term (current) drug therapy: Secondary | ICD-10-CM | POA: Diagnosis not present

## 2012-10-19 DIAGNOSIS — E785 Hyperlipidemia, unspecified: Secondary | ICD-10-CM | POA: Diagnosis not present

## 2012-10-19 DIAGNOSIS — E119 Type 2 diabetes mellitus without complications: Secondary | ICD-10-CM | POA: Diagnosis not present

## 2012-10-19 LAB — BASIC METABOLIC PANEL
CO2: 28 mEq/L (ref 19–32)
Calcium: 9.3 mg/dL (ref 8.4–10.5)
Creat: 1.09 mg/dL (ref 0.50–1.35)
Glucose, Bld: 125 mg/dL — ABNORMAL HIGH (ref 70–99)

## 2012-10-19 LAB — HEPATIC FUNCTION PANEL
ALT: 19 U/L (ref 0–53)
AST: 20 U/L (ref 0–37)
Alkaline Phosphatase: 50 U/L (ref 39–117)
Bilirubin, Direct: 0.2 mg/dL (ref 0.0–0.3)
Total Bilirubin: 1 mg/dL (ref 0.3–1.2)

## 2012-10-19 LAB — LIPID PANEL
Cholesterol: 96 mg/dL (ref 0–200)
HDL: 36 mg/dL — ABNORMAL LOW (ref >39)

## 2012-10-19 LAB — HEMOGLOBIN A1C: Mean Plasma Glucose: 117 mg/dL — ABNORMAL HIGH (ref <117)

## 2012-10-19 LAB — PSA, MEDICARE: PSA: 5.88 ng/mL — ABNORMAL HIGH (ref <=4.00)

## 2012-10-20 ENCOUNTER — Encounter: Payer: Self-pay | Admitting: *Deleted

## 2012-10-20 DIAGNOSIS — Q5564 Hidden penis: Secondary | ICD-10-CM | POA: Diagnosis not present

## 2012-10-22 ENCOUNTER — Encounter: Payer: Self-pay | Admitting: Family Medicine

## 2012-10-22 ENCOUNTER — Ambulatory Visit (INDEPENDENT_AMBULATORY_CARE_PROVIDER_SITE_OTHER): Payer: Medicare Other | Admitting: Family Medicine

## 2012-10-22 VITALS — BP 140/62 | HR 80 | Ht 65.75 in | Wt 195.1 lb

## 2012-10-22 DIAGNOSIS — R972 Elevated prostate specific antigen [PSA]: Secondary | ICD-10-CM | POA: Diagnosis not present

## 2012-10-22 DIAGNOSIS — I10 Essential (primary) hypertension: Secondary | ICD-10-CM

## 2012-10-22 DIAGNOSIS — I498 Other specified cardiac arrhythmias: Secondary | ICD-10-CM | POA: Diagnosis not present

## 2012-10-22 DIAGNOSIS — E785 Hyperlipidemia, unspecified: Secondary | ICD-10-CM | POA: Diagnosis not present

## 2012-10-22 DIAGNOSIS — R001 Bradycardia, unspecified: Secondary | ICD-10-CM

## 2012-10-22 NOTE — Progress Notes (Signed)
  Subjective:    Patient ID: Chad Martinez, male    DOB: 1935/04/20, 78 y.o.   MRN: [id number]  HPI Patient arrives office for followup of multiple concerns. Trying to watch her cholesterol medicine diet. His cut the fat intake down. Also walking a lot. In addition, patient has cut his sugar intake down. No chest pain. History of prostate biopsy x3 in the past. Got PSA blood work once again. Trying to watch his diet. Walking at least 5 times per week. Reports that his reflux is stable as long as he still right foods.   Review of Systems Review systems otherwise negative    Objective:   Physical Exam    Results for orders placed in visit on 10/15/12  PSA, MEDICARE      Result Value Range   PSA 5.88 (*) <=4.00 ng/mL  HEMOGLOBIN A1C      Result Value Range   Hemoglobin A1C 5.7 (*) <5.7 %   Mean Plasma Glucose 117 (*) <117 mg/dL  BASIC METABOLIC PANEL      Result Value Range   Sodium 142  135 - 145 mEq/L   Potassium 4.5  3.5 - 5.3 mEq/L   Chloride 104  96 - 112 mEq/L   CO2 28  19 - 32 mEq/L   Glucose, Bld 125 (*) 70 - 99 mg/dL   BUN 22  6 - 23 mg/dL   Creat 1.19  1.47 - 8.29 mg/dL   Calcium 9.3  8.4 - 56.2 mg/dL  LIPID PANEL      Result Value Range   Cholesterol 96  0 - 200 mg/dL   Triglycerides 82  <130 mg/dL   HDL 36 (*) >86 mg/dL   Total CHOL/HDL Ratio 2.7     VLDL 16  0 - 40 mg/dL   LDL Cholesterol 44  0 - 99 mg/dL  HEPATIC FUNCTION PANEL      Result Value Range   Total Bilirubin 1.0  0.3 - 1.2 mg/dL   Bilirubin, Direct 0.2  0.0 - 0.3 mg/dL   Indirect Bilirubin 0.8  0.0 - 0.9 mg/dL   Alkaline Phosphatase 50  39 - 117 U/L   AST 20  0 - 37 U/L   ALT 19  0 - 53 U/L   Total Protein 6.6  6.0 - 8.3 g/dL   Albumin 4.3  3.5 - 5.2 g/dL   Alert. No acute distress. HEENT normal. Vitals reviewed. Lungs clear. Heart regular rate and rhythm. Abdomen benign.    Assessment & Plan:  Impression 1 hypertension good control. #2 hyperlipidemia control also good. #3 impaired fasting  glucose mild elevation yet A1c good discuss. #4 coronary artery disease clinically stable. #5 elevated PSA discuss. Plan 25 minutes spent most in discussion. Maintain same medications. Diet exercise discussed. Check in 6 months. WSL

## 2012-10-26 ENCOUNTER — Encounter: Payer: Self-pay | Admitting: *Deleted

## 2012-11-07 ENCOUNTER — Other Ambulatory Visit: Payer: Self-pay | Admitting: Cardiovascular Disease

## 2012-11-26 ENCOUNTER — Encounter: Payer: Self-pay | Admitting: *Deleted

## 2012-11-26 LAB — REMOTE PACEMAKER DEVICE
BAMS-0001: 155 {beats}/min
BATTERY VOLTAGE: 2.79 V
VENTRICULAR PACING PM: 0.1

## 2012-12-01 ENCOUNTER — Other Ambulatory Visit: Payer: Self-pay | Admitting: *Deleted

## 2012-12-01 MED ORDER — METOPROLOL TARTRATE 25 MG PO TABS: 25.0000 mg | ORAL_TABLET | Freq: Two times a day (BID) | ORAL | Status: DC

## 2012-12-01 MED ORDER — SIMVASTATIN 80 MG PO TABS: 80.0000 mg | ORAL_TABLET | Freq: Every day | ORAL | Status: DC

## 2012-12-01 NOTE — Telephone Encounter (Signed)
Rx refill for simvastatin and metoprolol

## 2012-12-04 DIAGNOSIS — H338 Other retinal detachments: Secondary | ICD-10-CM | POA: Diagnosis not present

## 2012-12-04 DIAGNOSIS — Z961 Presence of intraocular lens: Secondary | ICD-10-CM | POA: Diagnosis not present

## 2012-12-15 ENCOUNTER — Ambulatory Visit (INDEPENDENT_AMBULATORY_CARE_PROVIDER_SITE_OTHER): Payer: Medicare Other | Admitting: Family Medicine

## 2012-12-15 ENCOUNTER — Encounter: Payer: Self-pay | Admitting: Family Medicine

## 2012-12-15 VITALS — BP 150/84 | HR 70 | Ht 65.5 in | Wt 192.0 lb

## 2012-12-15 DIAGNOSIS — R972 Elevated prostate specific antigen [PSA]: Secondary | ICD-10-CM

## 2012-12-15 DIAGNOSIS — Z23 Encounter for immunization: Secondary | ICD-10-CM

## 2012-12-15 DIAGNOSIS — Z0000 Encounter for general adult medical examination without abnormal findings: Secondary | ICD-10-CM

## 2012-12-15 DIAGNOSIS — I10 Essential (primary) hypertension: Secondary | ICD-10-CM

## 2012-12-15 DIAGNOSIS — E785 Hyperlipidemia, unspecified: Secondary | ICD-10-CM | POA: Diagnosis not present

## 2012-12-15 NOTE — Progress Notes (Signed)
Subjective:    Patient ID: Chad Martinez, male    DOB: Oct 04, 1934, 78 y.o.   MRN: [id number]  HPI  Results for orders placed in visit on 11/07/12  REMOTE PACEMAKER DEVICE      Result Value Range   DEVICE MODEL PM [id number]     [id number] Croitoru, Mihai   MD     [id number] Thurmon Fair   MD     [id number] monitoring service     [phone]       Value: Pacemaker remote check. Device function reviewed. Impedance, sensing, auto capture thresholds consistent with previous measurements. Histograms appropriate for patient and level of activity. All other diagnostic data reviewed and is appropriate and      stable for patient. Real time/magnet EGM shows appropriate sensing and capture. 2 mode switches(<30 sec) and no ventricular high rate episodes. Estimated longevity 14.5 years. Plan to follow up with Dr. Royann Shivers in August 2014 for yearly follow up.      (recall made)   ATRIAL PACING PM 33.4     VENTRICULAR PACING PM 0.1     BATTERY VOLTAGE 2.79     AL IMPEDENCE PM 501     RV LEAD IMPEDENCE PM 697     AL AMPLITUDE 2.8     RV LEAD AMPLITUDE 16     AL THRESHOLD 1.5     RV LEAD THRESHOLD 2     [id number]     Patient states overall doing well. Trying to watch his diet. Compliant with his medications. No new concerns. Last colonoscopy 03, was advised to repeat in 10 years.  History of impaired glucose, trying to watch his diet. Review of Systems  Constitutional: Negative for fever, activity change and appetite change.  HENT: Negative for congestion, rhinorrhea and neck pain.   Eyes: Negative for discharge.  Respiratory: Negative for cough and wheezing.   Cardiovascular: Negative for chest pain.  Gastrointestinal: Negative for vomiting, abdominal pain and blood in stool.  Genitourinary: Negative for frequency and difficulty urinating.  Skin: Negative for rash.  Allergic/Immunologic: Negative for environmental allergies and food allergies.  Neurological: Negative for weakness  and headaches.  Psychiatric/Behavioral: Negative for agitation.       Objective:   Physical Exam  Vitals reviewed. Constitutional: He appears well-developed and well-nourished.  HENT:  Head: Normocephalic and atraumatic.  Right Ear: External ear normal.  Left Ear: External ear normal.  Nose: Nose normal.  Mouth/Throat: Oropharynx is clear and moist.  Eyes: EOM are normal. Pupils are equal, round, and reactive to light.  Neck: Normal range of motion. Neck supple. No thyromegaly present.  Cardiovascular: Normal rate, regular rhythm and normal heart sounds.   No murmur heard. Pulmonary/Chest: Effort normal and breath sounds normal. No respiratory distress. He has no wheezes.  Abdominal: Soft. Bowel sounds are normal. He exhibits no distension and no mass. There is no tenderness.  Genitourinary: Penis normal.  Musculoskeletal: Normal range of motion. He exhibits no edema.  Lymphadenopathy:    He has no cervical adenopathy.  Neurological: He is alert. He exhibits normal muscle tone.  Skin: Skin is warm and dry. No erythema.  Psychiatric: He has a normal mood and affect. His behavior is normal. Judgment normal.          Assessment & Plan:  Impression 1 wellness exam. #2 hypertension good control. #3 hyperlipidemia decent control. #4 colon screening to colonoscopy soon patient states we'll work on. #5 history of elevated PSA followed by a new specialists  in [place]. Plan diet exercise discussed. Maintain same meds. Check every 6 months. Encouraged to get colonoscopy this year. Pneumococcal vaccine. WSL

## 2012-12-15 NOTE — Patient Instructions (Signed)
Keep working hard on diet and exercise. Schedule your screening colponoscopy

## 2012-12-15 NOTE — Progress Notes (Signed)
No falls in past 6 months. Mini Cog Test- passed.

## 2012-12-16 ENCOUNTER — Telehealth: Payer: Self-pay

## 2012-12-16 ENCOUNTER — Other Ambulatory Visit: Payer: Self-pay

## 2012-12-16 DIAGNOSIS — Z1211 Encounter for screening for malignant neoplasm of colon: Secondary | ICD-10-CM

## 2012-12-17 ENCOUNTER — Encounter: Payer: Self-pay | Admitting: *Deleted

## 2012-12-18 ENCOUNTER — Encounter: Payer: Self-pay | Admitting: Cardiovascular Disease

## 2012-12-18 ENCOUNTER — Ambulatory Visit (INDEPENDENT_AMBULATORY_CARE_PROVIDER_SITE_OTHER): Payer: Medicare Other | Admitting: Cardiovascular Disease

## 2012-12-18 VITALS — BP 150/86 | HR 66 | Resp 18 | Ht 67.0 in | Wt 194.3 lb

## 2012-12-18 DIAGNOSIS — I10 Essential (primary) hypertension: Secondary | ICD-10-CM | POA: Diagnosis not present

## 2012-12-18 DIAGNOSIS — I2510 Atherosclerotic heart disease of native coronary artery without angina pectoris: Secondary | ICD-10-CM | POA: Diagnosis not present

## 2012-12-18 DIAGNOSIS — Z950 Presence of cardiac pacemaker: Secondary | ICD-10-CM

## 2012-12-18 DIAGNOSIS — E785 Hyperlipidemia, unspecified: Secondary | ICD-10-CM

## 2012-12-18 DIAGNOSIS — I441 Atrioventricular block, second degree: Secondary | ICD-10-CM

## 2012-12-18 NOTE — Telephone Encounter (Signed)
Gastroenterology Pre-Procedure Form   PT HAS A PACEMAKER   Request Date: 12/16/2012      Requesting Physician: Dr. Lubertha South     PATIENT INFORMATION:  Chad Martinez is a 78 y.o., male (DOB=[birth year]).  PROCEDURE: Procedure(s) requested: colonoscopy Procedure Reason: screening for colon cancer  PATIENT REVIEW QUESTIONS: The patient reports the following:   1. Diabetes Melitis: no 2. Joint replacements in the past 12 months: no 3. Major health problems in the past 3 months: no 4. Has an artificial valve or MVP:no 5. Has been advised in past to take antibiotics in advance of a procedure like teeth cleaning: no}    MEDICATIONS & ALLERGIES:    Patient reports the following regarding taking any blood thinners:   Plavix? no Aspirin? YES Coumadin?  no  Patient confirms/reports the following medications:  Current Outpatient Prescriptions  Medication Sig Dispense Refill  . aspirin EC 81 MG tablet Take 81 mg by mouth daily.      . metoprolol tartrate (LOPRESSOR) 25 MG tablet Take 1 tablet (25 mg total) by mouth 2 (two) times daily.  180 tablet  3  . nitroGLYCERIN (NITROSTAT) 0.4 MG SL tablet Place 0.4 mg under the tongue every 5 (five) minutes as needed for chest pain.      . simvastatin (ZOCOR) 80 MG tablet Take 1 tablet (80 mg total) by mouth at bedtime.  90 tablet  3   No current facility-administered medications for this visit.    Patient confirms/reports the following allergies:  No Known Allergies  Patient is appropriate to schedule for requested procedure(s): YES  AUTHORIZATION INFORMATION Primary Insurance:   ID #:   Group #:  Pre-Cert / Auth required: Pre-Cert / Auth #:  Secondary Insurance:  ID #:   Group #:  Pre-Cert / Auth required:  Pre-Cert / Auth #:   No orders of the defined types were placed in this encounter.    SCHEDULE INFORMATION: Procedure has been scheduled as follows:  Date: 01/25/2013        Time:  8:30 AM Location: [facility] Short  Stay  This Gastroenterology Pre-Precedure Form is being routed to the following provider(s) for review: R. Roetta Sessions, MD

## 2012-12-18 NOTE — Patient Instructions (Addendum)
Remote pacemaker transmission due on 02-10-2013. Please transmit anytime during your appointment day. Your physician recommends that you schedule a follow-up appointment in: 1 year.

## 2012-12-19 ENCOUNTER — Encounter: Payer: Self-pay | Admitting: Cardiovascular Disease

## 2012-12-19 DIAGNOSIS — Z951 Presence of aortocoronary bypass graft: Secondary | ICD-10-CM | POA: Insufficient documentation

## 2012-12-19 DIAGNOSIS — Z950 Presence of cardiac pacemaker: Secondary | ICD-10-CM | POA: Insufficient documentation

## 2012-12-19 DIAGNOSIS — I441 Atrioventricular block, second degree: Secondary | ICD-10-CM | POA: Insufficient documentation

## 2012-12-19 NOTE — Assessment & Plan Note (Signed)
Medtronic Adapta ADD RL1, serial number [id number], implanted 12/11/2011 for high-grade second-degree atrioventricular block. His device has demonstrated normal function by remote [employer] checks and by in office testing today.Note that he only paces the ventricle very infrequently, likely due to MVP. 2 very brief episodes of mode switch have been recorded (both under 30 seconds in duration). Lead parameters are excellent. Estimated battery longevity is over 14 years.

## 2012-12-19 NOTE — Assessment & Plan Note (Signed)
All his lipid parameters are within the desirable range except for slightly low HDL. Weight loss would be highly beneficial.

## 2012-12-19 NOTE — Assessment & Plan Note (Signed)
Satisfactory control. 

## 2012-12-19 NOTE — Progress Notes (Signed)
Patient ID: Chad Martinez, male   DOB: Aug 25, 1934, 78 y.o.   MRN: [id number]     Reason for office visit Second degree A-V block, pacemaker, coronary artery disease status post bypass surgery  Chad Martinez has done well in the year since his last appointment. He is physically very active and has no complaints of chest pain or shortness of breath at rest or with exertion. He denies any palpitations and has not had syncope or near syncope. He has been on down loading his pacemaker data via [employer] every 3 months and no problems have been noted. The device was initially implanted after a fall from a ladder complicated by chest injury pneumothorax. He was noted to have recurrent episodes of second-degree atrial ventricular block Mobitz type II and was not entirely clear whether or not his fall had been caused by syncope.  His pacemaker was fully interrogated today, including active testing the pacing thresholds. The full report is in a separate note.    No Known Allergies  Current Outpatient Prescriptions  Medication Sig Dispense Refill  . aspirin EC 81 MG tablet Take 81 mg by mouth daily.      . metoprolol tartrate (LOPRESSOR) 25 MG tablet Take 1 tablet (25 mg total) by mouth 2 (two) times daily.  180 tablet  3  . nitroGLYCERIN (NITROSTAT) 0.4 MG SL tablet Place 0.4 mg under the tongue every 5 (five) minutes as needed for chest pain.      . simvastatin (ZOCOR) 80 MG tablet Take 1 tablet (80 mg total) by mouth at bedtime.  90 tablet  3   No current facility-administered medications for this visit.    Past Medical History  Diagnosis Date  . Hypertension   . Coronary artery disease   . Enlarged prostate   . Detached retina     Partial  . Reflux   . Colon polyps   . Elevated PSA   . Syncope     Permanent Pacemaker 12/11/2011 MDT Adapta  . Hyperlipidemia   . Pneumothorax 12/10/2011    right rib fx after fall from ladder    Past Surgical History  Procedure Laterality Date  . Bypass  graft  10/08    LIMA to LAD,SVG to 2nd & 3rd obtuse marginal CX,SVG to PDA,1st posterolateral and 2nd posterolateral RCA  . Coronary artery bypass graft  04/19/2012  . Eye surgery  2004  . Skin graft  2012  . Hemorroidectomy    . Permanent pacemaker insertion  12/11/2011    MDT Adapta  . US echocardiography  10/14/2007    mild mitral annular ca+,EF 55%,AOV mildly sclerotic  . Nm myoview ltd  11/05/2010    no ischemia    Family History  Problem Relation Age of Onset  . Pneumonia Mother   . Multiple sclerosis Father   . Lung cancer Brother     History   Social History  . Marital Status: Married    Spouse Name: N/A    Number of Children: N/A  . Years of Education: N/A   Occupational History  . Not on file.   Social History Main Topics  . Smoking status: Former Smoker    Quit date: 12/19/1974  . Smokeless tobacco: Never Used  . Alcohol Use: No  . Drug Use: No  . Sexually Active: Not on file   Other Topics Concern  . Not on file   Social History Narrative  . No narrative on file    Review of systems:  The patient specifically denies any chest pain at rest or with exertion, dyspnea at rest or with exertion, orthopnea, paroxysmal nocturnal dyspnea, syncope, palpitations, focal neurological deficits, intermittent claudication, lower extremity edema, unexplained weight gain, cough, hemoptysis or wheezing.  The patient also denies abdominal pain, nausea, vomiting, dysphagia, diarrhea, constipation, polyuria, polydipsia, dysuria, hematuria, frequency, urgency, abnormal bleeding or bruising, fever, chills, unexpected weight changes, mood swings, change in skin or hair texture, change in voice quality, auditory or visual problems, allergic reactions or rashes, new musculoskeletal complaints other than usual "aches and pains".   PHYSICAL EXAM BP 150/86  Pulse 66  Resp 18  Ht 5\' 7"  (1.702 m)  Wt 194 lb 4.8 oz (88.134 kg)  BMI 30.42 kg/m2  General: Alert, oriented x3, no  distress Head: no evidence of trauma, PERRL, EOMI, no exophtalmos or lid lag, no myxedema, no xanthelasma; normal ears, nose and oropharynx Neck: normal jugular venous pulsations and no hepatojugular reflux; brisk carotid pulses without delay and no carotid bruits Chest: clear to auscultation, no signs of consolidation by percussion or palpation, normal fremitus, symmetrical and full respiratory excursions; healthy left subclavian pacemaker site and sternotomy scar Cardiovascular: normal position and quality of the apical impulse, regular rhythm, normal first and second heart sounds, no murmurs, rubs or gallops Abdomen: no tenderness or distention, no masses by palpation, no abnormal pulsatility or arterial bruits, normal bowel sounds, no hepatosplenomegaly Extremities: no clubbing, cyanosis or edema; 2+ radial, ulnar and brachial pulses bilaterally; 2+ right femoral, posterior tibial and dorsalis pedis pulses; 2+ left femoral, posterior tibial and dorsalis pedis pulses; no subclavian or femoral bruits Neurological: grossly nonfocal   EKG: Atrial paced ventricular sensed  Lipid Panel     Component Value Date/Time   CHOL 96 10/19/2012 0720   TRIG 82 10/19/2012 0720   HDL 36* 10/19/2012 0720   CHOLHDL 2.7 10/19/2012 0720   VLDL 16 10/19/2012 0720   LDLCALC 44 10/19/2012 0720    BMET    Component Value Date/Time   NA 142 10/19/2012 0720   K 4.5 10/19/2012 0720   CL 104 10/19/2012 0720   CO2 28 10/19/2012 0720   GLUCOSE 125* 10/19/2012 0720   BUN 22 10/19/2012 0720   CREATININE 1.09 10/19/2012 0720   CREATININE 0.90 12/12/2011 0412   CALCIUM 9.3 10/19/2012 0720   GFRNONAA 82* 12/12/2011 0412   GFRAA >90 12/12/2011 0412     ASSESSMENT AND PLAN Pacemaker Medtronic Adapta ADD RL1, serial number [id number], implanted 12/11/2011 for high-grade second-degree atrioventricular block. His device has demonstrated normal function by remote [employer] checks and by in office testing today.Note that he only paces  the ventricle very infrequently, likely due to MVP. 2 very brief episodes of mode switch have been recorded (both under 30 seconds in duration). Lead parameters are excellent. Estimated battery longevity is over 14 years.  CAD (coronary artery disease) s/p CABG 2008 (LIMA to LAD, sequential SVG to OM2 and OM3, sequential SVG to PDA, PLV 1 and PLV2) Currently asymptomatic, most recent functional study in 2012 showed no evidence of ischemia and good exercise capacity (10 mets)  HTN (hypertension) Satisfactory control  Other and unspecified hyperlipidemia All his lipid parameters are within the desirable range except for slightly low HDL. Weight loss would be highly beneficial.   Orders Placed This Encounter  Procedures  . EKG 12-Lead   No orders of the defined types were placed in this encounter.    Bunny Kleist  Thurmon Fair, MD, [facility] [phone] office [phone] pager

## 2012-12-19 NOTE — Assessment & Plan Note (Signed)
Currently asymptomatic, most recent functional study in 2012 showed no evidence of ischemia and good exercise capacity (10 mets)

## 2012-12-21 NOTE — Telephone Encounter (Signed)
Appropriate for procedure.  Does he have a defibrillator?  Need to annotate on the comments that he has a pacemaker when scheduling TCS.

## 2012-12-22 ENCOUNTER — Telehealth: Payer: Self-pay | Admitting: Cardiovascular Disease

## 2012-12-22 ENCOUNTER — Encounter: Payer: Self-pay | Admitting: Cardiovascular Disease

## 2012-12-22 MED ORDER — PEG-KCL-NACL-NASULF-NA ASC-C 100 G PO SOLR: 1.0000 | ORAL | Status: DC

## 2012-12-22 NOTE — Telephone Encounter (Signed)
Spoke to pt's wife and she said just a pacemaker, no defibrillator. Selena Batten is aware and is adding the pacemaker on notes for the procedure.

## 2012-12-22 NOTE — Telephone Encounter (Signed)
Was here Friday to see Dr Alcide Goodness thought he said he was going to call in prescription for nitrogylcerin-its not at [facility] call to [facility] pharmacy in [place]!

## 2012-12-22 NOTE — Telephone Encounter (Signed)
Rx sent to the pharmacy and instructions mailed to pt.  

## 2012-12-23 MED ORDER — NITROGLYCERIN 0.4 MG SL SUBL: 0.4000 mg | SUBLINGUAL_TABLET | SUBLINGUAL | Status: DC | PRN

## 2012-12-23 NOTE — Telephone Encounter (Signed)
Dr. Royann Shivers notified and did want Rx sent.  Refill(s) sent to pharmacy.

## 2013-01-05 ENCOUNTER — Telehealth: Payer: Self-pay

## 2013-01-05 NOTE — Telephone Encounter (Signed)
LMOM for a return call to update meds prior to procedure on 01/25/2013.

## 2013-01-12 NOTE — Telephone Encounter (Signed)
Letter mailed to pt to call to update meds prior to procedure on 01/25/2013.

## 2013-01-12 NOTE — Telephone Encounter (Signed)
Pt's wife called and said that he has not had any new problems and no change in meds.

## 2013-01-13 ENCOUNTER — Encounter (HOSPITAL_COMMUNITY): Payer: Self-pay | Admitting: Pharmacy Technician

## 2013-01-13 NOTE — Telephone Encounter (Signed)
Appropriate.

## 2013-01-25 ENCOUNTER — Ambulatory Visit (HOSPITAL_COMMUNITY)
Admission: RE | Admit: 2013-01-25 | Discharge: 2013-01-25 | Disposition: A | Discharge status: Home / Self Care | Payer: Medicare Other | Source: Ambulatory Visit | Attending: Internal Medicine | Admitting: Internal Medicine

## 2013-01-25 ENCOUNTER — Encounter (HOSPITAL_COMMUNITY): Payer: Self-pay | Admitting: *Deleted

## 2013-01-25 ENCOUNTER — Encounter (HOSPITAL_COMMUNITY)
Admission: RE | Disposition: A | Discharge status: Home / Self Care | Payer: Self-pay | Source: Ambulatory Visit | Attending: Internal Medicine

## 2013-01-25 DIAGNOSIS — K5730 Diverticulosis of large intestine without perforation or abscess without bleeding: Secondary | ICD-10-CM | POA: Insufficient documentation

## 2013-01-25 DIAGNOSIS — Z1211 Encounter for screening for malignant neoplasm of colon: Secondary | ICD-10-CM | POA: Diagnosis not present

## 2013-01-25 DIAGNOSIS — D126 Benign neoplasm of colon, unspecified: Secondary | ICD-10-CM | POA: Insufficient documentation

## 2013-01-25 DIAGNOSIS — I10 Essential (primary) hypertension: Secondary | ICD-10-CM | POA: Diagnosis not present

## 2013-01-25 HISTORY — PX: COLONOSCOPY: SHX5424

## 2013-01-25 SURGERY — COLONOSCOPY
Anesthesia: Moderate Sedation

## 2013-01-25 MED ORDER — SODIUM CHLORIDE 0.9 % IV SOLN
INTRAVENOUS | Status: DC
  Administered 2013-01-25: 08:00:00 via INTRAVENOUS

## 2013-01-25 MED ORDER — MEPERIDINE HCL 100 MG/ML IJ SOLN
INTRAMUSCULAR | Status: DC | PRN
  Administered 2013-01-25: 25 mg via INTRAVENOUS
  Administered 2013-01-25: 50 mg via INTRAVENOUS

## 2013-01-25 MED ORDER — STERILE WATER FOR IRRIGATION IR SOLN
Status: DC | PRN
  Administered 2013-01-25: 09:00:00

## 2013-01-25 MED ORDER — MIDAZOLAM HCL 5 MG/5ML IJ SOLN
INTRAMUSCULAR | Status: DC | PRN
  Administered 2013-01-25: 2 mg via INTRAVENOUS
  Administered 2013-01-25: 1 mg via INTRAVENOUS

## 2013-01-25 MED ORDER — MIDAZOLAM HCL 5 MG/5ML IJ SOLN
INTRAMUSCULAR | Status: AC
  Filled 2013-01-25: qty 10

## 2013-01-25 MED ORDER — ONDANSETRON HCL 4 MG/2ML IJ SOLN
INTRAMUSCULAR | Status: AC
  Filled 2013-01-25: qty 2

## 2013-01-25 MED ORDER — MEPERIDINE HCL 100 MG/ML IJ SOLN
INTRAMUSCULAR | Status: AC
  Filled 2013-01-25: qty 1

## 2013-01-25 MED ORDER — ONDANSETRON HCL 4 MG/2ML IJ SOLN
INTRAMUSCULAR | Status: DC | PRN
  Administered 2013-01-25: 4 mg via INTRAVENOUS

## 2013-01-25 NOTE — Op Note (Signed)
[facility] [address] [place], [address]   COLONOSCOPY PROCEDURE REPORT  PATIENT: Chad Martinez, Chad Martinez  MR#:         [id number] BIRTHDATE: February 09, 1935 , 77  yrs. old GENDER: Male ENDOSCOPIST: R.  Roetta Sessions, MD FACP FACG REFERRED BY:  Simone Curia, M.D. PROCEDURE DATE:  01/25/2013 PROCEDURE:     Colonoscopy with snare polypectomy  INDICATIONS: Average risk colorectal cancer screening examination  INFORMED CONSENT:  The risks, benefits, alternatives and imponderables including but not limited to bleeding, perforation as well as the possibility of a missed lesion have been reviewed.  The potential for biopsy, lesion removal, etc. have also been discussed.  Questions have been answered.  All parties agreeable. Please see the history and physical in the medical record for more information.  MEDICATIONS: Versed 3 mg IV and Demerol 75 mg IV in divided doses. Zofran 4 mg  DESCRIPTION OF PROCEDURE:  After a digital rectal exam was performed, the EC-[id number] ([id number])  colonoscope was advanced from the anus through the rectum and colon to the area of the cecum, ileocecal valve and appendiceal orifice.  The cecum was deeply intubated.  These structures were well-seen and photographed for the record.  From the level of the cecum and ileocecal valve, the scope was slowly and cautiously withdrawn.  The mucosal surfaces were carefully surveyed utilizing scope tip deflection to facilitate fold flattening as needed.  The scope was pulled down into the rectum where a thorough examination including retroflexion was performed.    FINDINGS:  Adequate preparation. Normal rectum. Scattered left-sided diverticula; (1) 5 mm polyp at the hepatic flexure and (1) 4 mm polyp in the transverse segments; otherwise, the remainder of colonic mucosa appeared normal.  THERAPEUTIC / DIAGNOSTIC MANEUVERS PERFORMED:  The above-mentioned polyps were cold  snared/removed.  COMPLICATIONS: None  CECAL WITHDRAWAL TIME:  11 minutes  IMPRESSION:  Colonic diverticulosis and polyps-removed as described above.  RECOMMENDATIONS: Followup on pathology.   _______________________________ eSigned:  R. Roetta Sessions, MD FACP [facility] 01/25/2013 9:26 AM   CC:    PATIENT NAME:  Chad Martinez, Chad Martinez MR#: [id number]

## 2013-01-25 NOTE — H&P (Addendum)
Primary Care Physician:  Harlow Asa, MD Primary Gastroenterologist:  Dr. Jena Gauss  Pre-Procedure History & Physical: HPI:  Chad Martinez is a 78 y.o. male is here for a screening colonoscopy.  No bowel symptoms. No family history of colon cancer. Negative screening colonoscopy about 11 years ago.  Past Medical History  Diagnosis Date  . Hypertension   . Coronary artery disease   . Enlarged prostate   . Detached retina     Partial  . Reflux   . Colon polyps   . Elevated PSA   . Syncope     Permanent Pacemaker 12/11/2011 MDT Adapta  . Hyperlipidemia   . Pneumothorax 12/10/2011    right rib fx after fall from ladder    Past Surgical History  Procedure Laterality Date  . Bypass graft  10/08    LIMA to LAD,SVG to 2nd & 3rd obtuse marginal CX,SVG to PDA,1st posterolateral and 2nd posterolateral RCA  . Coronary artery bypass graft  04/19/2012  . Eye surgery  2004  . Skin graft  2012  . Hemorroidectomy    . Permanent pacemaker insertion  12/11/2011    MDT Adapta  . US echocardiography  10/14/2007    mild mitral annular ca+,EF 55%,AOV mildly sclerotic  . Nm myoview ltd  11/05/2010    no ischemia    Prior to Admission medications   Medication Sig Start Date End Date Taking? Authorizing Provider  aspirin EC 81 MG tablet Take 81 mg by mouth daily.   Yes Historical Provider, MD  metoprolol tartrate (LOPRESSOR) 25 MG tablet Take 1 tablet (25 mg total) by mouth 2 (two) times daily. 12/01/12  Yes Runell Gess, MD  peg 3350 powder (MOVIPREP) 100 G SOLR Take 1 kit (100 g total) by mouth as directed. 12/22/12  Yes Corbin Ade, MD  simvastatin (ZOCOR) 80 MG tablet Take 1 tablet (80 mg total) by mouth at bedtime. 12/01/12  Yes Runell Gess, MD  nitroGLYCERIN (NITROSTAT) 0.4 MG SL tablet Place 1 tablet (0.4 mg total) under the tongue every 5 (five) minutes as needed for chest pain. 12/23/12   Thurmon Fair, MD    Allergies as of 12/16/2012  . (No Known Allergies)    Family History   Problem Relation Age of Onset  . Pneumonia Mother   . Multiple sclerosis Father   . Lung cancer Brother     History   Social History  . Marital Status: Married    Spouse Name: N/A    Number of Children: N/A  . Years of Education: N/A   Occupational History  . Not on file.   Social History Main Topics  . Smoking status: Former Smoker -- 3.00 packs/day for 20 years    Quit date: 12/19/1974  . Smokeless tobacco: Never Used  . Alcohol Use: No  . Drug Use: No  . Sexually Active: Not on file   Other Topics Concern  . Not on file   Social History Narrative  . No narrative on file    Review of Systems: See HPI, otherwise negative ROS  Physical Exam: BP 168/78  Pulse 64  Temp(Src) 98.2 F (36.8 C) (Oral)  Resp 15  Ht 5\' 7"  (1.702 m)  Wt 189 lb (85.73 kg)  BMI 29.59 kg/m2  SpO2 95% General:   Alert,  Well-developed, well-nourished, pleasant and cooperative in NAD Head:  Normocephalic and atraumatic. Eyes:  Sclera clear, no icterus.   Conjunctiva pink. Ears:  Normal auditory acuity. Nose:  No  deformity, discharge,  or lesions. Mouth:  No deformity or lesions, dentition normal. Neck:  Supple; no masses or thyromegaly. Lungs:  Clear throughout to auscultation.   No wheezes, crackles, or rhonchi. No acute distress. Heart:  Regular rate and rhythm; no murmurs, clicks, rubs,  or gallops. Abdomen:  Soft, nontender and nondistended. No masses, hepatosplenomegaly or hernias noted. Normal bowel sounds, without guarding, and without rebound.   Msk:  Symmetrical without gross deformities. Normal posture. Pulses:  Normal pulses noted. Extremities:  Without clubbing or edema. Neurologic:  Alert and  oriented x4;  grossly normal neurologically. Skin:  Intact without significant lesions or rashes. Cervical Nodes:  No significant cervical adenopathy. Psych:  Alert and cooperative. Normal mood and affect.  Impression/Plan: Chad Martinez is now here to undergo a screening  colonoscopy.  Average risk screening examination  Risks, benefits, limitations, imponderables and alternatives regarding colonoscopy have been reviewed with the patient. Questions have been answered. All parties agreeable.

## 2013-01-27 ENCOUNTER — Encounter (HOSPITAL_COMMUNITY): Payer: Self-pay | Admitting: Internal Medicine

## 2013-01-28 ENCOUNTER — Encounter: Payer: Self-pay | Admitting: Internal Medicine

## 2013-02-09 ENCOUNTER — Other Ambulatory Visit: Payer: Self-pay | Admitting: Cardiovascular Disease

## 2013-02-09 DIAGNOSIS — I441 Atrioventricular block, second degree: Secondary | ICD-10-CM | POA: Diagnosis not present

## 2013-02-09 LAB — PACEMAKER DEVICE OBSERVATION

## 2013-02-16 ENCOUNTER — Encounter: Payer: Self-pay | Admitting: *Deleted

## 2013-02-16 LAB — REMOTE PACEMAKER DEVICE
AL IMPEDENCE PM: 501 Ohm
AL THRESHOLD: 0.625 V
ATRIAL PACING PM: 36.5
RV LEAD AMPLITUDE: 16 mv
RV LEAD IMPEDENCE PM: 697 Ohm
RV LEAD THRESHOLD: 0.5 V

## 2013-04-16 DIAGNOSIS — Z23 Encounter for immunization: Secondary | ICD-10-CM | POA: Diagnosis not present

## 2013-05-08 LAB — PACEMAKER DEVICE OBSERVATION

## 2013-05-10 ENCOUNTER — Ambulatory Visit (INDEPENDENT_AMBULATORY_CARE_PROVIDER_SITE_OTHER): Payer: Medicare Other

## 2013-05-10 DIAGNOSIS — I498 Other specified cardiac arrhythmias: Secondary | ICD-10-CM

## 2013-05-10 DIAGNOSIS — R001 Bradycardia, unspecified: Secondary | ICD-10-CM

## 2013-05-10 DIAGNOSIS — I441 Atrioventricular block, second degree: Secondary | ICD-10-CM

## 2013-05-24 ENCOUNTER — Encounter: Payer: Self-pay | Admitting: *Deleted

## 2013-05-24 LAB — MDC_IDC_ENUM_SESS_TYPE_REMOTE
Brady Statistic AP VS Percent: 39 %
Brady Statistic AS VS Percent: 61 %
Date Time Interrogation Session: 20141101120130
Lead Channel Impedance Value: 480 Ohm
Lead Channel Impedance Value: 647 Ohm
Lead Channel Pacing Threshold Amplitude: 0.5 V
Lead Channel Pacing Threshold Pulse Width: 0.4 ms
Lead Channel Pacing Threshold Pulse Width: 0.4 ms
Lead Channel Sensing Intrinsic Amplitude: 16 mV
Lead Channel Setting Pacing Amplitude: 1.5 V
Lead Channel Setting Sensing Sensitivity: 5.6 mV

## 2013-08-09 ENCOUNTER — Ambulatory Visit (INDEPENDENT_AMBULATORY_CARE_PROVIDER_SITE_OTHER): Payer: Medicare Other | Admitting: *Deleted

## 2013-08-09 DIAGNOSIS — I498 Other specified cardiac arrhythmias: Secondary | ICD-10-CM | POA: Diagnosis not present

## 2013-08-09 DIAGNOSIS — R001 Bradycardia, unspecified: Secondary | ICD-10-CM

## 2013-08-09 DIAGNOSIS — I441 Atrioventricular block, second degree: Secondary | ICD-10-CM | POA: Diagnosis not present

## 2013-08-17 LAB — MDC_IDC_ENUM_SESS_TYPE_REMOTE
Battery Impedance: 135 Ohm
Battery Remaining Longevity: 160 mo
Brady Statistic AP VP Percent: 0 %
Brady Statistic AS VS Percent: 61 %
Lead Channel Impedance Value: 517 Ohm
Lead Channel Impedance Value: 669 Ohm
Lead Channel Pacing Threshold Pulse Width: 0.4 ms
Lead Channel Sensing Intrinsic Amplitude: 2.8 mV
Lead Channel Setting Pacing Amplitude: 1.5 V
Lead Channel Setting Pacing Pulse Width: 0.4 ms
Lead Channel Setting Sensing Sensitivity: 5.6 mV
MDC IDC MSMT BATTERY VOLTAGE: 2.79 V
MDC IDC MSMT LEADCHNL RA PACING THRESHOLD AMPLITUDE: 0.625 V
MDC IDC MSMT LEADCHNL RA PACING THRESHOLD PULSEWIDTH: 0.4 ms
MDC IDC MSMT LEADCHNL RV PACING THRESHOLD AMPLITUDE: 0.5 V
MDC IDC MSMT LEADCHNL RV SENSING INTR AMPL: 22.4 mV
MDC IDC SESS DTM: 20150131140927
MDC IDC SET LEADCHNL RV PACING AMPLITUDE: 2 V
MDC IDC STAT BRADY AP VS PERCENT: 39 %
MDC IDC STAT BRADY AS VP PERCENT: 0 %

## 2013-09-06 ENCOUNTER — Encounter: Payer: Self-pay | Admitting: *Deleted

## 2013-09-09 ENCOUNTER — Encounter: Payer: Self-pay | Admitting: Cardiovascular Disease

## 2013-09-21 ENCOUNTER — Telehealth: Payer: Self-pay | Admitting: Family Medicine

## 2013-09-21 ENCOUNTER — Other Ambulatory Visit: Payer: Self-pay | Admitting: *Deleted

## 2013-09-21 DIAGNOSIS — R972 Elevated prostate specific antigen [PSA]: Secondary | ICD-10-CM

## 2013-09-21 DIAGNOSIS — E785 Hyperlipidemia, unspecified: Secondary | ICD-10-CM

## 2013-09-21 DIAGNOSIS — R739 Hyperglycemia, unspecified: Secondary | ICD-10-CM

## 2013-09-21 DIAGNOSIS — Z131 Encounter for screening for diabetes mellitus: Secondary | ICD-10-CM | POA: Diagnosis not present

## 2013-09-21 DIAGNOSIS — I10 Essential (primary) hypertension: Secondary | ICD-10-CM

## 2013-09-21 DIAGNOSIS — Z79899 Other long term (current) drug therapy: Secondary | ICD-10-CM

## 2013-09-21 DIAGNOSIS — R7309 Other abnormal glucose: Secondary | ICD-10-CM | POA: Diagnosis not present

## 2013-09-21 DIAGNOSIS — Z125 Encounter for screening for malignant neoplasm of prostate: Secondary | ICD-10-CM | POA: Diagnosis not present

## 2013-09-21 NOTE — Telephone Encounter (Signed)
Rep same b w 

## 2013-09-21 NOTE — Telephone Encounter (Signed)
Patient notified

## 2013-09-21 NOTE — Telephone Encounter (Signed)
Patient needs order for blood work. °

## 2013-09-21 NOTE — Telephone Encounter (Signed)
Labs on 4/14: Lip, liv, met7, A1C, and PSA

## 2013-09-22 LAB — HEMOGLOBIN A1C
Hgb A1c MFr Bld: 6.1 % — ABNORMAL HIGH (ref <5.7)
MEAN PLASMA GLUCOSE: 128 mg/dL — AB (ref <117)

## 2013-09-22 LAB — LIPID PANEL
CHOLESTEROL: 76 mg/dL (ref 0–200)
HDL: 25 mg/dL — ABNORMAL LOW (ref >39)
LDL Cholesterol: 33 mg/dL (ref 0–99)
Total CHOL/HDL Ratio: 3 Ratio
Triglycerides: 89 mg/dL (ref <150)
VLDL: 18 mg/dL (ref 0–40)

## 2013-09-22 LAB — BASIC METABOLIC PANEL
BUN: 23 mg/dL (ref 6–23)
CHLORIDE: 106 meq/L (ref 96–112)
CO2: 33 mEq/L — ABNORMAL HIGH (ref 19–32)
Calcium: 8.3 mg/dL — ABNORMAL LOW (ref 8.4–10.5)
Creat: 0.94 mg/dL (ref 0.50–1.35)
Glucose, Bld: 125 mg/dL — ABNORMAL HIGH (ref 70–99)
POTASSIUM: 4.3 meq/L (ref 3.5–5.3)
SODIUM: 142 meq/L (ref 135–145)

## 2013-09-22 LAB — HEPATIC FUNCTION PANEL
ALT: 21 U/L (ref 0–53)
AST: 19 U/L (ref 0–37)
Albumin: 4.1 g/dL (ref 3.5–5.2)
Alkaline Phosphatase: 58 U/L (ref 39–117)
BILIRUBIN DIRECT: 0.2 mg/dL (ref 0.0–0.3)
BILIRUBIN INDIRECT: 0.7 mg/dL (ref 0.2–1.2)
BILIRUBIN TOTAL: 0.9 mg/dL (ref 0.2–1.2)
Total Protein: 6.4 g/dL (ref 6.0–8.3)

## 2013-09-23 ENCOUNTER — Encounter: Payer: Self-pay | Admitting: Family Medicine

## 2013-09-23 ENCOUNTER — Ambulatory Visit (INDEPENDENT_AMBULATORY_CARE_PROVIDER_SITE_OTHER): Payer: Medicare Other | Admitting: Family Medicine

## 2013-09-23 VITALS — BP 148/80 | Temp 98.5°F | Ht 67.0 in | Wt 192.0 lb

## 2013-09-23 DIAGNOSIS — J683 Other acute and subacute respiratory conditions due to chemicals, gases, fumes and vapors: Secondary | ICD-10-CM

## 2013-09-23 DIAGNOSIS — J45909 Unspecified asthma, uncomplicated: Secondary | ICD-10-CM

## 2013-09-23 DIAGNOSIS — J209 Acute bronchitis, unspecified: Secondary | ICD-10-CM | POA: Diagnosis not present

## 2013-09-23 LAB — PSA, MEDICARE: PSA: 5.87 ng/mL — AB (ref <=4.00)

## 2013-09-23 MED ORDER — ALBUTEROL SULFATE HFA 108 (90 BASE) MCG/ACT IN AERS: 2.0000 | INHALATION_SPRAY | Freq: Four times a day (QID) | RESPIRATORY_TRACT | Status: DC | PRN

## 2013-09-23 MED ORDER — LEVOFLOXACIN 500 MG PO TABS: 500.0000 mg | ORAL_TABLET | Freq: Every day | ORAL | Status: AC

## 2013-09-23 NOTE — Progress Notes (Signed)
   Subjective:    Patient ID: Chad Martinez, male    DOB: [birth year], 79 y.o.   MRN: [id number]  Cough This is a new problem. The current episode started in the past 7 days.    Started as cold and then sore throat,  And then rattle in the chest  No obv fever  Hx of wheezing Pos prod cough  No chest pain or pressure. Positive history of coronary artery disease.  Review of Systems  Respiratory: Positive for cough.    No vomiting no diarrhea no rash    Objective:   Physical Exam  Slight malaise intermittent cough during exam with wheezy texture. Vitals reviewed. HEENT moderate nasal congestion slight malaise next supple. Lungs bilateral expiratory wheezes with bronchial cough heart regular in rhythm.      Assessment & Plan:  Impression bronchitis with reactive airways plan Ventolin 2 sprays 4 times a day proper use discussed antibiotics prescribed. Symptomatic care and warning signs discussed. Highly doubt cardiac etiology rationale discussed. WSL

## 2013-10-01 ENCOUNTER — Ambulatory Visit (INDEPENDENT_AMBULATORY_CARE_PROVIDER_SITE_OTHER): Payer: Medicare Other | Admitting: Family Medicine

## 2013-10-01 ENCOUNTER — Encounter: Payer: Self-pay | Admitting: Family Medicine

## 2013-10-01 VITALS — BP 146/80 | Ht 67.0 in | Wt 192.0 lb

## 2013-10-01 DIAGNOSIS — I10 Essential (primary) hypertension: Secondary | ICD-10-CM

## 2013-10-01 DIAGNOSIS — R972 Elevated prostate specific antigen [PSA]: Secondary | ICD-10-CM | POA: Diagnosis not present

## 2013-10-01 NOTE — Progress Notes (Signed)
   Subjective:    Patient ID: Chad Martinez, male    DOB: 04-05-1935, 79 y.o.   MRN: [id number]  HPI Patient is here today for a check up.  He had BW done last week and would like to go over the results.  He is feeling much better now after his sick visit last week.    Pt sees urologist in [place], and also see the cariologist  Exercise regularly thru the winter, was living in [place] it.  Trying to watch diet. Has not missed any salt intake.  Review of Systems No headache no chest pain no back pain no abdominal pain no change in bowel habits no blood in stool ROS otherwise negative    Objective:   Physical Exam  Alert no apparent distress. Lungs clear. Heart regular in rhythm. Blood pressure stable repeat. H&T normal. Ankles without edema.  Results for orders placed in visit on 09/21/13  LIPID PANEL      Result Value Ref Range   Cholesterol 76  0 - 200 mg/dL   Triglycerides 89  <150 mg/dL   HDL 25 (*) >39 mg/dL   Total CHOL/HDL Ratio 3.0     VLDL 18  0 - 40 mg/dL   LDL Cholesterol 33  0 - 99 mg/dL  HEPATIC FUNCTION PANEL      Result Value Ref Range   Total Bilirubin 0.9  0.2 - 1.2 mg/dL   Bilirubin, Direct 0.2  0.0 - 0.3 mg/dL   Indirect Bilirubin 0.7  0.2 - 1.2 mg/dL   Alkaline Phosphatase 58  39 - 117 U/L   AST 19  0 - 37 U/L   ALT 21  0 - 53 U/L   Total Protein 6.4  6.0 - 8.3 g/dL   Albumin 4.1  3.5 - 5.2 g/dL  HEMOGLOBIN A1C      Result Value Ref Range   Hemoglobin A1C 6.1 (*) <5.7 %   Mean Plasma Glucose 128 (*) <117 mg/dL  PSA, MEDICARE      Result Value Ref Range   PSA 5.87 (*) <=4.00 ng/mL  BASIC METABOLIC PANEL      Result Value Ref Range   Sodium 142  135 - 145 mEq/L   Potassium 4.3  3.5 - 5.3 mEq/L   Chloride 106  96 - 112 mEq/L   CO2 33 (*) 19 - 32 mEq/L   Glucose, Bld 125 (*) 70 - 99 mg/dL   BUN 23  6 - 23 mg/dL   Creat 0.94  0.50 - 1.35 mg/dL   Calcium 8.3 (*) 8.4 - 10.5 mg/dL       Assessment & Plan:  Impression chronic elevated PSA followed  by specialist we'll get these results to them. #2 hypertension good control. #3 hyperlipidemia good control discussed. #4 coronary artery disease clinically silent plan diet discussed exercise discussed. Maintain same medications. Check every 6 months. WSL

## 2013-10-15 ENCOUNTER — Encounter: Payer: Self-pay | Admitting: *Deleted

## 2013-10-19 ENCOUNTER — Ambulatory Visit (INDEPENDENT_AMBULATORY_CARE_PROVIDER_SITE_OTHER): Payer: Medicare Other | Admitting: Cardiovascular Disease

## 2013-10-19 ENCOUNTER — Encounter: Payer: Self-pay | Admitting: Cardiovascular Disease

## 2013-10-19 VITALS — BP 138/70 | HR 78 | Ht 68.0 in | Wt 192.0 lb

## 2013-10-19 DIAGNOSIS — I441 Atrioventricular block, second degree: Secondary | ICD-10-CM | POA: Diagnosis not present

## 2013-10-19 DIAGNOSIS — I10 Essential (primary) hypertension: Secondary | ICD-10-CM | POA: Diagnosis not present

## 2013-10-19 DIAGNOSIS — E785 Hyperlipidemia, unspecified: Secondary | ICD-10-CM

## 2013-10-19 DIAGNOSIS — I2510 Atherosclerotic heart disease of native coronary artery without angina pectoris: Secondary | ICD-10-CM | POA: Diagnosis not present

## 2013-10-19 DIAGNOSIS — Z950 Presence of cardiac pacemaker: Secondary | ICD-10-CM | POA: Diagnosis not present

## 2013-10-19 MED ORDER — METOPROLOL TARTRATE 25 MG PO TABS: 25.0000 mg | ORAL_TABLET | Freq: Two times a day (BID) | ORAL | Status: DC

## 2013-10-19 MED ORDER — SIMVASTATIN 80 MG PO TABS: 80.0000 mg | ORAL_TABLET | Freq: Every day | ORAL | Status: DC

## 2013-10-19 NOTE — Patient Instructions (Signed)
Your physician wants you to follow-up in: 1 year with Dr Berry. You will receive a reminder letter in the mail two months in advance. If you don't receive a letter, please call our office to schedule the follow-up appointment.  

## 2013-10-19 NOTE — Assessment & Plan Note (Signed)
Status post coronary artery bypass grafting x6 by Dr. Arvid Right 04/20/07. His last Myoview stress test performed 09/24/12 was nonischemic. He denies chest pain or shortness of breath.

## 2013-10-19 NOTE — Assessment & Plan Note (Signed)
On statin therapy followed by his primary care physician. His most recent lipid profile performed 09/21/13 revealed a total cholesterol of 76, LDL of 33 and HDL of 25

## 2013-10-19 NOTE — Assessment & Plan Note (Signed)
Well-controlled on current medications 

## 2013-10-19 NOTE — Progress Notes (Signed)
10/19/2013 Chad Martinez   May 10, 1935  [id number]  Primary Physician Rubbie Battiest, MD Primary Cardiologist: Lorretta Harp MD [name]   HPI:  The patient is a very pleasant 79 year old mildly overweight married Caucasian male, father of 7, who is accompanied by his wife today. I last saw him in the office a year ago. He has a history of CAD, status post coronary artery bypass grafting x6, April 20, 2007, by Dr. Gilford Raid. His other problems include remote tobacco abuse and hyperlipidemia. He is asymptomatic. He vacations 6 months of the year in [place], [place], near [place]. He had an episode of syncope with high-grade second-degree AV block requiring dual-chamber pacemaker implantation by Dr. Sanda Klein with a Medtronic Adapta device. Dr. Sallyanne Kuster last saw him in the office in June of last year. The patient has remote interrogation via [facility] and is scheduled to see Dr. Sallyanne Kuster back in the office this coming June. He did have a Myoview stress test performed in the office last month, which was completely normal. Since I saw him one year ago he remains currently stable and is asymptomatic. His primary care physician because his lipid profile.    Current Outpatient Prescriptions  Medication Sig Dispense Refill  . albuterol (PROVENTIL HFA;VENTOLIN HFA) 108 (90 BASE) MCG/ACT inhaler Inhale 2 puffs into the lungs every 6 (six) hours as needed for wheezing or shortness of breath.  1 Inhaler  2  . aspirin EC 81 MG tablet Take 81 mg by mouth daily.      . metoprolol tartrate (LOPRESSOR) 25 MG tablet Take 1 tablet (25 mg total) by mouth 2 (two) times daily.  180 tablet  3  . nitroGLYCERIN (NITROSTAT) 0.4 MG SL tablet Place 1 tablet (0.4 mg total) under the tongue every 5 (five) minutes as needed for chest pain.  25 tablet  2  . simvastatin (ZOCOR) 80 MG tablet Take 1 tablet (80 mg total) by mouth at bedtime.  90 tablet  3   No current facility-administered medications  for this visit.    No Known Allergies  History   Social History  . Marital Status: Married    Spouse Name: N/A    Number of Children: N/A  . Years of Education: N/A   Occupational History  . Not on file.   Social History Main Topics  . Smoking status: Former Smoker -- 3.00 packs/day for 20 years    Quit date: 12/19/1974  . Smokeless tobacco: Never Used  . Alcohol Use: No  . Drug Use: No  . Sexual Activity: Not on file   Other Topics Concern  . Not on file   Social History Narrative  . No narrative on file     Review of Systems: General: negative for chills, fever, night sweats or weight changes.  Cardiovascular: negative for chest pain, dyspnea on exertion, edema, orthopnea, palpitations, paroxysmal nocturnal dyspnea or shortness of breath Dermatological: negative for rash Respiratory: negative for cough or wheezing Urologic: negative for hematuria Abdominal: negative for nausea, vomiting, diarrhea, bright red blood per rectum, melena, or hematemesis Neurologic: negative for visual changes, syncope, or dizziness All other systems reviewed and are otherwise negative except as noted above.    Blood pressure 138/70, pulse 78, height 5\' 8"  (1.727 m), weight 192 lb (87.091 kg).  General appearance: alert and no distress Neck: no adenopathy, no carotid bruit, no JVD, supple, symmetrical, trachea midline and thyroid not enlarged, symmetric, no tenderness/mass/nodules Lungs: clear to auscultation bilaterally  Heart: regular rate and rhythm, S1, S2 normal, no murmur, click, rub or gallop Extremities: extremities normal, atraumatic, no cyanosis or edema  EKG atrially paced rhythm at 78  ASSESSMENT AND PLAN:   CAD (coronary artery disease) s/p CABG 2008 (LIMA to LAD, sequential SVG to OM2 and OM3, sequential SVG to PDA, PLV 1 and PLV2) Status post coronary artery bypass grafting x6 by Dr. Arvid Right 04/20/07. His last Myoview stress test performed 09/24/12 was nonischemic.  He denies chest pain or shortness of breath.  HTN (hypertension) Well-controlled on current medications  Mobitz type 2 second degree AV block Status post permanent transvenous pacemaker insertion by Dr. Sanda Klein with a Medtronic Adapta device. He pauses on an annual basis.  Other and unspecified hyperlipidemia On statin therapy followed by his primary care physician. His most recent lipid profile performed 09/21/13 revealed a total cholesterol of 76, LDL of 33 and HDL of 25      Lorretta Harp MD [facility], [facility] 10/19/2013 2:00 PM

## 2013-10-19 NOTE — Assessment & Plan Note (Signed)
Status post permanent transvenous pacemaker insertion by Dr. Sanda Klein with a Medtronic Adapta device. He pauses on an annual basis.

## 2013-12-14 ENCOUNTER — Telehealth: Payer: Self-pay | Admitting: Family Medicine

## 2013-12-14 NOTE — Telephone Encounter (Signed)
error 

## 2013-12-17 DIAGNOSIS — R972 Elevated prostate specific antigen [PSA]: Secondary | ICD-10-CM | POA: Diagnosis not present

## 2013-12-17 DIAGNOSIS — Q5564 Hidden penis: Secondary | ICD-10-CM | POA: Diagnosis not present

## 2013-12-24 ENCOUNTER — Encounter: Payer: Self-pay | Admitting: *Deleted

## 2013-12-25 ENCOUNTER — Telehealth: Payer: Self-pay | Admitting: Cardiovascular Disease

## 2013-12-25 ENCOUNTER — Encounter: Payer: Self-pay | Admitting: Cardiovascular Disease

## 2014-01-10 NOTE — Telephone Encounter (Signed)
Closed encounter °

## 2014-01-27 ENCOUNTER — Telehealth: Payer: Self-pay | Admitting: Family Medicine

## 2014-01-27 DIAGNOSIS — R739 Hyperglycemia, unspecified: Secondary | ICD-10-CM

## 2014-01-27 DIAGNOSIS — Z79899 Other long term (current) drug therapy: Secondary | ICD-10-CM

## 2014-01-27 DIAGNOSIS — I10 Essential (primary) hypertension: Secondary | ICD-10-CM

## 2014-01-27 DIAGNOSIS — E785 Hyperlipidemia, unspecified: Secondary | ICD-10-CM

## 2014-01-27 NOTE — Telephone Encounter (Signed)
Does patient need blood work ordered for Next wednesdays appointment?

## 2014-01-27 NOTE — Telephone Encounter (Signed)
09/21/13: lipid, liver, a1c, psa medicare, met 7

## 2014-01-27 NOTE — Telephone Encounter (Signed)
Lip liv glu 

## 2014-01-28 NOTE — Telephone Encounter (Signed)
Patient's wife notified and verbalized understanding.  

## 2014-01-31 DIAGNOSIS — E785 Hyperlipidemia, unspecified: Secondary | ICD-10-CM | POA: Diagnosis not present

## 2014-01-31 DIAGNOSIS — Z79899 Other long term (current) drug therapy: Secondary | ICD-10-CM | POA: Diagnosis not present

## 2014-01-31 DIAGNOSIS — R7309 Other abnormal glucose: Secondary | ICD-10-CM | POA: Diagnosis not present

## 2014-01-31 LAB — LIPID PANEL
CHOL/HDL RATIO: 2.8 ratio
Cholesterol: 95 mg/dL (ref 0–200)
HDL: 34 mg/dL — AB (ref >39)
LDL CALC: 37 mg/dL (ref 0–99)
TRIGLYCERIDES: 122 mg/dL (ref <150)
VLDL: 24 mg/dL (ref 0–40)

## 2014-01-31 LAB — HEPATIC FUNCTION PANEL
ALBUMIN: 4.2 g/dL (ref 3.5–5.2)
ALT: 22 U/L (ref 0–53)
AST: 19 U/L (ref 0–37)
Alkaline Phosphatase: 50 U/L (ref 39–117)
Bilirubin, Direct: 0.3 mg/dL (ref 0.0–0.3)
Indirect Bilirubin: 0.9 mg/dL (ref 0.2–1.2)
TOTAL PROTEIN: 6.8 g/dL (ref 6.0–8.3)
Total Bilirubin: 1.2 mg/dL (ref 0.2–1.2)

## 2014-01-31 LAB — HEMOGLOBIN A1C
Hgb A1c MFr Bld: 6.1 % — ABNORMAL HIGH (ref <5.7)
MEAN PLASMA GLUCOSE: 128 mg/dL — AB (ref <117)

## 2014-01-31 LAB — GLUCOSE, RANDOM: GLUCOSE: 124 mg/dL — AB (ref 70–99)

## 2014-02-02 ENCOUNTER — Ambulatory Visit (INDEPENDENT_AMBULATORY_CARE_PROVIDER_SITE_OTHER): Payer: Medicare Other | Admitting: Family Medicine

## 2014-02-02 ENCOUNTER — Encounter: Payer: Self-pay | Admitting: Family Medicine

## 2014-02-02 VITALS — BP 128/74 | HR 64 | Ht 65.5 in | Wt 192.0 lb

## 2014-02-02 DIAGNOSIS — Z0000 Encounter for general adult medical examination without abnormal findings: Secondary | ICD-10-CM

## 2014-02-02 NOTE — Progress Notes (Signed)
Subjective:    Patient ID: Chad Martinez, male    DOB: 1935-05-04, 79 y.o.   MRN: [id number]  HPI AWV- Annual Wellness Visit  The patient was seen for their annual wellness visit. The patient's past medical history, surgical history, and family history were reviewed. Pertinent vaccines were reviewed ( tetanus, pneumonia, shingles, flu). Patient states he had tetanus in [place] within the past few years. Not sure of date.  The patient's medication list was reviewed and updated.  The height and weight were entered. The patient's current BMI is 31.46  Cognitive screening was completed. Outcome of Mini - Cog: Pass  Falls within the past 6 months: None  Current tobacco usage: None (All patients who use tobacco were given written and verbal information on quitting)  Recent listing of emergency department/hospitalizations over the past year were reviewed.  current specialist the patient sees on a regular basis: Cardiologist once yearly, pace maker checked once yearly, eye dr once yearly.   Medicare annual wellness visit patient questionnaire was reviewed.  A written screening schedule for the patient for the next 5-10 years was given. Appropriate discussion of followup regarding next visit was discussed.  Results for orders placed in visit on 01/27/14  LIPID PANEL      Result Value Ref Range   Cholesterol 95  0 - 200 mg/dL   Triglycerides 122  <150 mg/dL   HDL 34 (*) >39 mg/dL   Total CHOL/HDL Ratio 2.8     VLDL 24  0 - 40 mg/dL   LDL Cholesterol 37  0 - 99 mg/dL  HEPATIC FUNCTION PANEL      Result Value Ref Range   Total Bilirubin 1.2  0.2 - 1.2 mg/dL   Bilirubin, Direct 0.3  0.0 - 0.3 mg/dL   Indirect Bilirubin 0.9  0.2 - 1.2 mg/dL   Alkaline Phosphatase 50  39 - 117 U/L   AST 19  0 - 37 U/L   ALT 22  0 - 53 U/L   Total Protein 6.8  6.0 - 8.3 g/dL   Albumin 4.2  3.5 - 5.2 g/dL  HEMOGLOBIN A1C      Result Value Ref Range   Hemoglobin A1C 6.1 (*) <5.7 %   Mean Plasma  Glucose 128 (*) <117 mg/dL  GLUCOSE, RANDOM      Result Value Ref Range   Glucose, Bld 124 (*) 70 - 99 mg/dL    Reg exercise five or six d per wk  Glu numb 110 to 118 120/  Eye doc visit soon  Patient notes occasional rare sleep disturbance with good dreams and physical activity. Went away occurs only once every couple months.  Review of Systems  Constitutional: Negative for fever, activity change and appetite change.  HENT: Negative for congestion and rhinorrhea.   Eyes: Negative for discharge.  Respiratory: Negative for cough and wheezing.   Cardiovascular: Negative for chest pain.  Gastrointestinal: Negative for vomiting, abdominal pain and blood in stool.  Genitourinary: Negative for frequency and difficulty urinating.  Musculoskeletal: Negative for neck pain.  Skin: Negative for rash.  Allergic/Immunologic: Negative for environmental allergies and food allergies.  Neurological: Negative for weakness and headaches.  Psychiatric/Behavioral: Negative for agitation.  All other systems reviewed and are negative.      Objective:   Physical Exam  Vitals reviewed. Constitutional: He appears well-developed and well-nourished.  Patient overweight  HENT:  Head: Normocephalic and atraumatic.  Right Ear: External ear normal.  Left Ear: External ear normal.  Nose: Nose normal.  Mouth/Throat: Oropharynx is clear and moist.  Eyes: EOM are normal. Pupils are equal, round, and reactive to light.  Neck: Normal range of motion. Neck supple. No thyromegaly present.  Cardiovascular: Normal rate, regular rhythm and normal heart sounds.   No murmur heard. Pulmonary/Chest: Effort normal and breath sounds normal. No respiratory distress. He has no wheezes.  Abdominal: Soft. Bowel sounds are normal. He exhibits no distension and no mass. There is no tenderness.  Genitourinary: Penis normal.  Musculoskeletal: Normal range of motion. He exhibits no edema.  Lymphadenopathy:    He has no  cervical adenopathy.  Neurological: He is alert. He exhibits normal muscle tone.  Skin: Skin is warm and dry. No erythema.  Psychiatric: He has a normal mood and affect. His behavior is normal. Judgment normal.    Prostate within normal limits      Assessment & Plan:  Impression #1 wellness exam #2 coronary artery disease. #3 hyperlipidemia good control. #4 hypertension good control. #5 glucose intolerance good control. #6 chronic elevated PSA. Plan diet exercise discussed. Maintain same meds. Check every 6 months. WSL

## 2014-02-10 ENCOUNTER — Encounter: Payer: Medicare Other | Admitting: Cardiovascular Disease

## 2014-02-25 DIAGNOSIS — H338 Other retinal detachments: Secondary | ICD-10-CM | POA: Diagnosis not present

## 2014-03-01 ENCOUNTER — Ambulatory Visit (INDEPENDENT_AMBULATORY_CARE_PROVIDER_SITE_OTHER): Payer: Medicare Other | Admitting: Cardiovascular Disease

## 2014-03-01 ENCOUNTER — Encounter: Payer: Self-pay | Admitting: Cardiovascular Disease

## 2014-03-01 VITALS — BP 152/90 | HR 81 | Resp 16 | Ht 67.0 in | Wt 194.6 lb

## 2014-03-01 DIAGNOSIS — I2510 Atherosclerotic heart disease of native coronary artery without angina pectoris: Secondary | ICD-10-CM

## 2014-03-01 DIAGNOSIS — I498 Other specified cardiac arrhythmias: Secondary | ICD-10-CM | POA: Diagnosis not present

## 2014-03-01 DIAGNOSIS — R001 Bradycardia, unspecified: Secondary | ICD-10-CM

## 2014-03-01 DIAGNOSIS — I441 Atrioventricular block, second degree: Secondary | ICD-10-CM | POA: Diagnosis not present

## 2014-03-01 LAB — MDC_IDC_ENUM_SESS_TYPE_INCLINIC
Battery Voltage: 2.79 V
Brady Statistic AP VS Percent: 42 %
Brady Statistic AS VP Percent: 0 %
Date Time Interrogation Session: 20150825103111
Lead Channel Impedance Value: 727 Ohm
Lead Channel Pacing Threshold Amplitude: 0.5 V
Lead Channel Pacing Threshold Amplitude: 0.5 V
Lead Channel Pacing Threshold Pulse Width: 0.4 ms
Lead Channel Setting Pacing Amplitude: 2 V
MDC IDC MSMT BATTERY IMPEDANCE: 135 Ohm
MDC IDC MSMT BATTERY REMAINING LONGEVITY: 160 mo
MDC IDC MSMT LEADCHNL RA IMPEDANCE VALUE: 474 Ohm
MDC IDC MSMT LEADCHNL RA SENSING INTR AMPL: 4 mV
MDC IDC MSMT LEADCHNL RV PACING THRESHOLD PULSEWIDTH: 0.4 ms
MDC IDC MSMT LEADCHNL RV SENSING INTR AMPL: 11.2 mV
MDC IDC SET LEADCHNL RA PACING AMPLITUDE: 1.5 V
MDC IDC SET LEADCHNL RV PACING PULSEWIDTH: 0.4 ms
MDC IDC SET LEADCHNL RV SENSING SENSITIVITY: 4 mV
MDC IDC STAT BRADY AP VP PERCENT: 0 %
MDC IDC STAT BRADY AS VS PERCENT: 58 %

## 2014-03-01 LAB — PACEMAKER DEVICE OBSERVATION

## 2014-03-01 NOTE — Progress Notes (Signed)
Patient ID: Chad Martinez, male   DOB: Jul 09, 1934, 79 y.o.   MRN: [id number]      Reason for office visit Pacemaker followup, CAD  Chad Martinez is a 79 year old gentleman who is now 79 years status post bypass surgery in roughly 79 years status post pacemaker implantation for high-grade second-degree AV block. He has no cardiac complaints. His wife underwent partial colectomy for diverticulosis recently and he is taking care of all the housework without cardiac symptoms.  Pacemaker interrogation shows normal device function. He actually very rarely paces the ventricle. He does have roughly 41% atrial pacing with good heart rate histogram distribution. Lead and battery parameters are excellent. There have been no meaningful episodes of tachyarrhythmia recorded by his device. He is compliant with remote checks every 3 months.  He has a history of CAD, status post coronary artery bypass grafting x6, April 20, 2007, by Dr. Gilford Martinez. Normal nuclear perfusion study in March 2014. Normal left ventricular systolic function. His other problems include remote tobacco abuse, HTN and hyperlipidemia. He vacations 6 months of the year in [place], [place], near [place]. He had an episode of syncope with high-grade second-degree AV block requiring dual-chamber pacemaker implantation with a Medtronic Adapta device    No Known Allergies  Current Outpatient Prescriptions  Medication Sig Dispense Refill  . albuterol (PROVENTIL HFA;VENTOLIN HFA) 108 (90 BASE) MCG/ACT inhaler Inhale 2 puffs into the lungs every 6 (six) hours as needed for wheezing or shortness of breath.  1 Inhaler  2  . aspirin EC 81 MG tablet Take 81 mg by mouth daily.      . metoprolol tartrate (LOPRESSOR) 25 MG tablet Take 1 tablet (25 mg total) by mouth 2 (two) times daily.  180 tablet  3  . nitroGLYCERIN (NITROSTAT) 0.4 MG SL tablet Place 1 tablet (0.4 mg total) under the tongue every 5 (five) minutes as needed for chest pain.  25 tablet  2   . simvastatin (ZOCOR) 80 MG tablet Take 1 tablet (80 mg total) by mouth at bedtime.  90 tablet  3   No current facility-administered medications for this visit.    Past Medical History  Diagnosis Date  . Hypertension   . Coronary artery disease     s/p CABG x 6  . Enlarged prostate   . Detached retina     Partial  . Reflux   . Colon polyps   . Elevated PSA   . Syncope     Permanent Pacemaker 12/11/2011 MDT Adapta  . Hyperlipidemia   . Pneumothorax 12/10/2011    right rib fx after fall from ladder    Past Surgical History  Procedure Laterality Date  . Bypass graft  10/08    LIMA to LAD,SVG to 2nd & 3rd obtuse marginal CX,SVG to PDA,1st posterolateral and 2nd posterolateral RCA  . Coronary artery bypass graft  04/19/2012  . Eye surgery  2004  . Skin graft  2012  . Hemorroidectomy    . Permanent pacemaker insertion  12/11/2011    MDT Adapta  . US echocardiography  10/14/2007    mild mitral annular ca+,EF 55%,AOV mildly sclerotic  . Nm myoview ltd  11/05/2010    no ischemia  . Colonoscopy N/A 01/25/2013    Procedure: COLONOSCOPY;  Surgeon: Chad Dolin, MD;  Location: [facility];  Service: Endoscopy;  Laterality: N/A;  8:30 Am    Family History  Problem Relation Age of Onset  . Pneumonia Mother   . Multiple sclerosis  Father   . Lung cancer Brother     History   Social History  . Marital Status: Married    Spouse Name: N/A    Number of Children: N/A  . Years of Education: N/A   Occupational History  . Not on file.   Social History Main Topics  . Smoking status: Former Smoker -- 3.00 packs/day for 20 years    Quit date: 12/19/1974  . Smokeless tobacco: Never Used  . Alcohol Use: No  . Drug Use: No  . Sexual Activity: Not on file   Other Topics Concern  . Not on file   Social History Narrative  . No narrative on file    Review of systems: The patient specifically denies any chest pain at rest or with exertion, dyspnea at rest or with exertion,  orthopnea, paroxysmal nocturnal dyspnea, syncope, palpitations, focal neurological deficits, intermittent claudication, lower extremity edema, unexplained weight gain, cough, hemoptysis or wheezing.  The patient also denies abdominal pain, nausea, vomiting, dysphagia, diarrhea, constipation, polyuria, polydipsia, dysuria, hematuria, frequency, urgency, abnormal bleeding or bruising, fever, chills, unexpected weight changes, mood swings, change in skin or hair texture, change in voice quality, auditory or visual problems, allergic reactions or rashes, new musculoskeletal complaints other than usual "aches and pains".   PHYSICAL EXAM BP 152/90  Pulse 81  Ht 5\' 7"  (1.702 m)  Wt 194 lb 9.6 oz (88.27 kg)  BMI 30.47 kg/m2  General: Alert, oriented x3, no distress Head: no evidence of trauma, PERRL, EOMI, no exophtalmos or lid lag, no myxedema, no xanthelasma; normal ears, nose and oropharynx Neck: normal jugular venous pulsations and no hepatojugular reflux; brisk carotid pulses without delay and no carotid bruits Chest: clear to auscultation, no signs of consolidation by percussion or palpation, normal fremitus, symmetrical and full respiratory excursions,  The sternotomy scar and the pacemaker scar both appear healthy Cardiovascular: normal position and quality of the apical impulse, regular rhythm, normal first and second heart sounds, no murmurs, rubs or gallops Abdomen: no tenderness or distention, no masses by palpation, no abnormal pulsatility or arterial bruits, normal bowel sounds, no hepatosplenomegaly Extremities: no clubbing, cyanosis or edema; 2+ radial, ulnar and brachial pulses bilaterally; 2+ right femoral, posterior tibial and dorsalis pedis pulses; 2+ left femoral, posterior tibial and dorsalis pedis pulses; no subclavian or femoral bruits Neurological: grossly nonfocal   EKG: Atrial paced, ventricular sensed, nonspecific ST segment depression in the inferior and lateral leads, not  changed from previous tracings  Lipid Panel     Component Value Date/Time   CHOL 95 01/31/2014 0714   TRIG 122 01/31/2014 0714   HDL 34* 01/31/2014 0714   CHOLHDL 2.8 01/31/2014 0714   VLDL 24 01/31/2014 0714   LDLCALC 37 01/31/2014 0714    BMET    Component Value Date/Time   NA 142 09/21/2013 0711   K 4.3 09/21/2013 0711   CL 106 09/21/2013 0711   CO2 33* 09/21/2013 0711   GLUCOSE 124* 01/31/2014 0714   BUN 23 09/21/2013 0711   CREATININE 0.94 09/21/2013 0711   CREATININE 0.90 12/12/2011 0412   CALCIUM 8.3* 09/21/2013 0711   GFRNONAA 82* 12/12/2011 0412   GFRAA >90 12/12/2011 0412     ASSESSMENT AND PLAN  CAD (coronary artery disease) s/p CABG 2008 (LIMA to LAD, sequential SVG to OM2 and OM3, sequential SVG to PDA, PLV 1 and PLV2)  Asymptomatic. Recent normal perfusion study. The focuses on risk factor modification. HTN (hypertension)  Follow blood pressure slightly high  today, but this is unusual, he is usually well-controlled on current medications  Mobitz type 2 second degree AV block Status post permanent pacemaker  Normal device function. Continue [facility] checks every 3 months.  Hyperlipidemia  His lipid parameters are excellent except for the low HDL of 34, which is actually an improvement from earlier this year. Weight loss and regular exercise will help.    Holli Humbles, MD, [place] [phone] office [phone] pager

## 2014-03-01 NOTE — Patient Instructions (Addendum)
Remote monitoring is used to monitor your pacemaker from home. This monitoring reduces the number of office visits required to check your device to one time per year. It allows [place] to keep an eye on the functioning of your device to ensure it is working properly. You are scheduled for a device check from home on 06-06-2014. You may send your transmission at any time that day. If you have a wireless device, the transmission will be sent automatically. After your physician reviews your transmission, you will receive a postcard with your next transmission date.  Your physician recommends that you schedule a follow-up appointment in: 12 months with Dr.Croitoru   Continue with current medication

## 2014-03-02 ENCOUNTER — Encounter: Payer: Self-pay | Admitting: Cardiovascular Disease

## 2014-03-22 ENCOUNTER — Ambulatory Visit (INDEPENDENT_AMBULATORY_CARE_PROVIDER_SITE_OTHER): Payer: Medicare Other | Admitting: *Deleted

## 2014-03-22 DIAGNOSIS — Z23 Encounter for immunization: Secondary | ICD-10-CM | POA: Diagnosis not present

## 2014-06-06 ENCOUNTER — Ambulatory Visit (INDEPENDENT_AMBULATORY_CARE_PROVIDER_SITE_OTHER): Payer: Medicare Other | Admitting: *Deleted

## 2014-06-06 DIAGNOSIS — R001 Bradycardia, unspecified: Secondary | ICD-10-CM

## 2014-06-06 DIAGNOSIS — I441 Atrioventricular block, second degree: Secondary | ICD-10-CM | POA: Diagnosis not present

## 2014-06-06 LAB — MDC_IDC_ENUM_SESS_TYPE_REMOTE
Brady Statistic AP VS Percent: 44 %
Brady Statistic AS VS Percent: 56 %
Date Time Interrogation Session: 20151130114235
Lead Channel Impedance Value: 711 Ohm
Lead Channel Pacing Threshold Amplitude: 0.375 V
Lead Channel Pacing Threshold Pulse Width: 0.4 ms
Lead Channel Pacing Threshold Pulse Width: 0.4 ms
Lead Channel Setting Pacing Amplitude: 2 V
Lead Channel Setting Sensing Sensitivity: 2.8 mV
MDC IDC MSMT BATTERY IMPEDANCE: 135 Ohm
MDC IDC MSMT BATTERY REMAINING LONGEVITY: 160 mo
MDC IDC MSMT BATTERY VOLTAGE: 2.79 V
MDC IDC MSMT LEADCHNL RA IMPEDANCE VALUE: 494 Ohm
MDC IDC MSMT LEADCHNL RA PACING THRESHOLD AMPLITUDE: 0.625 V
MDC IDC MSMT LEADCHNL RA SENSING INTR AMPL: 2.8 mV
MDC IDC MSMT LEADCHNL RV SENSING INTR AMPL: 8 mV
MDC IDC SET LEADCHNL RA PACING AMPLITUDE: 1.5 V
MDC IDC SET LEADCHNL RV PACING PULSEWIDTH: 0.46 ms
MDC IDC STAT BRADY AP VP PERCENT: 0 %
MDC IDC STAT BRADY AS VP PERCENT: 0 %

## 2014-06-07 NOTE — Progress Notes (Signed)
Remote pacemaker transmission.   

## 2014-06-13 ENCOUNTER — Encounter: Payer: Self-pay | Admitting: Cardiology

## 2014-06-16 ENCOUNTER — Encounter (HOSPITAL_COMMUNITY): Payer: Self-pay | Admitting: Cardiovascular Disease

## 2014-06-20 ENCOUNTER — Encounter: Payer: Self-pay | Admitting: Cardiovascular Disease

## 2014-09-05 ENCOUNTER — Ambulatory Visit (INDEPENDENT_AMBULATORY_CARE_PROVIDER_SITE_OTHER): Payer: Medicare Other | Admitting: *Deleted

## 2014-09-05 DIAGNOSIS — I441 Atrioventricular block, second degree: Secondary | ICD-10-CM

## 2014-09-06 NOTE — Progress Notes (Signed)
Remote pacemaker transmission.   

## 2014-09-13 LAB — MDC_IDC_ENUM_SESS_TYPE_REMOTE
Battery Impedance: 159 Ohm
Battery Voltage: 2.79 V
Brady Statistic AP VP Percent: 0 %
Brady Statistic AP VS Percent: 38 %
Brady Statistic AS VP Percent: 0 %
Brady Statistic AS VS Percent: 62 %
Lead Channel Impedance Value: 675 Ohm
Lead Channel Pacing Threshold Amplitude: 0.625 V
Lead Channel Sensing Intrinsic Amplitude: 16 mV
Lead Channel Sensing Intrinsic Amplitude: 2.8 mV
Lead Channel Setting Pacing Amplitude: 1.5 V
Lead Channel Setting Pacing Pulse Width: 0.4 ms
MDC IDC MSMT BATTERY REMAINING LONGEVITY: 155 mo
MDC IDC MSMT LEADCHNL RA IMPEDANCE VALUE: 518 Ohm
MDC IDC MSMT LEADCHNL RA PACING THRESHOLD PULSEWIDTH: 0.4 ms
MDC IDC MSMT LEADCHNL RV PACING THRESHOLD AMPLITUDE: 0.375 V
MDC IDC MSMT LEADCHNL RV PACING THRESHOLD PULSEWIDTH: 0.4 ms
MDC IDC SESS DTM: 20160229133159
MDC IDC SET LEADCHNL RV PACING AMPLITUDE: 2 V
MDC IDC SET LEADCHNL RV SENSING SENSITIVITY: 2.8 mV

## 2014-09-15 ENCOUNTER — Encounter: Payer: Self-pay | Admitting: *Deleted

## 2014-09-16 ENCOUNTER — Encounter: Payer: Self-pay | Admitting: Cardiovascular Disease

## 2014-10-05 ENCOUNTER — Telehealth: Payer: Self-pay | Admitting: Family Medicine

## 2014-10-05 DIAGNOSIS — I10 Essential (primary) hypertension: Secondary | ICD-10-CM

## 2014-10-05 DIAGNOSIS — Z125 Encounter for screening for malignant neoplasm of prostate: Secondary | ICD-10-CM

## 2014-10-05 DIAGNOSIS — Z79899 Other long term (current) drug therapy: Secondary | ICD-10-CM

## 2014-10-05 DIAGNOSIS — E785 Hyperlipidemia, unspecified: Secondary | ICD-10-CM

## 2014-10-05 DIAGNOSIS — R739 Hyperglycemia, unspecified: Secondary | ICD-10-CM

## 2014-10-05 NOTE — Telephone Encounter (Signed)
Pt needs bw orders for appt 4/12 Please call when sent   Last labs 01/31/14 Hep Func, A1C, Glucose Random, Lipid

## 2014-10-06 NOTE — Telephone Encounter (Signed)
Pt's wife notified and verbalized understanding to go to [facility] and be fasting.

## 2014-10-06 NOTE — Telephone Encounter (Signed)
Lip liv m7 A1c microportein urin

## 2014-10-07 DIAGNOSIS — Z79899 Other long term (current) drug therapy: Secondary | ICD-10-CM | POA: Diagnosis not present

## 2014-10-07 DIAGNOSIS — R739 Hyperglycemia, unspecified: Secondary | ICD-10-CM | POA: Diagnosis not present

## 2014-10-07 DIAGNOSIS — E785 Hyperlipidemia, unspecified: Secondary | ICD-10-CM | POA: Diagnosis not present

## 2014-10-07 DIAGNOSIS — I10 Essential (primary) hypertension: Secondary | ICD-10-CM | POA: Diagnosis not present

## 2014-10-07 DIAGNOSIS — R7309 Other abnormal glucose: Secondary | ICD-10-CM | POA: Diagnosis not present

## 2014-10-07 DIAGNOSIS — Z125 Encounter for screening for malignant neoplasm of prostate: Secondary | ICD-10-CM | POA: Diagnosis not present

## 2014-10-08 LAB — LIPID PANEL
CHOLESTEROL TOTAL: 94 mg/dL — AB (ref 100–199)
Chol/HDL Ratio: 2.4 ratio units (ref 0.0–5.0)
HDL: 39 mg/dL — ABNORMAL LOW (ref >39)
LDL CALC: 38 mg/dL (ref 0–99)
TRIGLYCERIDES: 83 mg/dL (ref 0–149)
VLDL CHOLESTEROL CAL: 17 mg/dL (ref 5–40)

## 2014-10-08 LAB — BASIC METABOLIC PANEL
BUN/Creatinine Ratio: 17 (ref 10–22)
BUN: 20 mg/dL (ref 8–27)
CO2: 26 mmol/L (ref 18–29)
CREATININE: 1.15 mg/dL (ref 0.76–1.27)
Calcium: 9.3 mg/dL (ref 8.6–10.2)
Chloride: 104 mmol/L (ref 97–108)
GFR calc Af Amer: 71 mL/min/{1.73_m2} (ref >59)
GFR calc non Af Amer: 61 mL/min/{1.73_m2} (ref >59)
GLUCOSE: 144 mg/dL — AB (ref 65–99)
Potassium: 4.4 mmol/L (ref 3.5–5.2)
SODIUM: 144 mmol/L (ref 134–144)

## 2014-10-08 LAB — HEPATIC FUNCTION PANEL
ALT: 18 IU/L (ref 0–44)
AST: 20 IU/L (ref 0–40)
Albumin: 4.6 g/dL (ref 3.5–4.8)
Alkaline Phosphatase: 52 IU/L (ref 39–117)
Bilirubin Total: 0.9 mg/dL (ref 0.0–1.2)
Bilirubin, Direct: 0.27 mg/dL (ref 0.00–0.40)
Total Protein: 6.6 g/dL (ref 6.0–8.5)

## 2014-10-08 LAB — MICROALBUMIN, URINE: Microalbumin, Urine: 6.1 ug/mL (ref 0.0–17.0)

## 2014-10-08 LAB — PSA: PSA: 7.1 ng/mL — ABNORMAL HIGH (ref 0.0–4.0)

## 2014-10-08 LAB — HEMOGLOBIN A1C
Est. average glucose Bld gHb Est-mCnc: 131 mg/dL
HEMOGLOBIN A1C: 6.2 % — AB (ref 4.8–5.6)

## 2014-10-18 ENCOUNTER — Encounter: Payer: Self-pay | Admitting: Family Medicine

## 2014-10-18 ENCOUNTER — Ambulatory Visit (INDEPENDENT_AMBULATORY_CARE_PROVIDER_SITE_OTHER): Payer: Medicare Other | Admitting: Family Medicine

## 2014-10-18 VITALS — BP 134/82 | Ht 67.0 in | Wt 192.0 lb

## 2014-10-18 DIAGNOSIS — Z23 Encounter for immunization: Secondary | ICD-10-CM

## 2014-10-18 DIAGNOSIS — E785 Hyperlipidemia, unspecified: Secondary | ICD-10-CM

## 2014-10-18 DIAGNOSIS — I10 Essential (primary) hypertension: Secondary | ICD-10-CM

## 2014-10-18 DIAGNOSIS — R972 Elevated prostate specific antigen [PSA]: Secondary | ICD-10-CM

## 2014-10-18 NOTE — Patient Instructions (Signed)
Results for orders placed or performed in visit on 10/05/14  Lipid panel  Result Value Ref Range   Cholesterol, Total 94 (L) 100 - 199 mg/dL   Triglycerides 83 0 - 149 mg/dL   HDL 39 (L) >39 mg/dL   VLDL Cholesterol Cal 17 5 - 40 mg/dL   LDL Calculated 38 0 - 99 mg/dL   Chol/HDL Ratio 2.4 0.0 - 5.0 ratio units  Hepatic function panel  Result Value Ref Range   Total Protein 6.6 6.0 - 8.5 g/dL   Albumin 4.6 3.5 - 4.8 g/dL   Bilirubin Total 0.9 0.0 - 1.2 mg/dL   Bilirubin, Direct 0.27 0.00 - 0.40 mg/dL   Alkaline Phosphatase 52 39 - 117 IU/L   AST 20 0 - 40 IU/L   ALT 18 0 - 44 IU/L  Basic metabolic panel  Result Value Ref Range   Glucose 144 (H) 65 - 99 mg/dL   BUN 20 8 - 27 mg/dL   Creatinine, Ser 1.15 0.76 - 1.27 mg/dL   GFR calc non Af Amer 61 >59 mL/min/1.73   GFR calc Af Amer 71 >59 mL/min/1.73   BUN/Creatinine Ratio 17 10 - 22   Sodium 144 134 - 144 mmol/L   Potassium 4.4 3.5 - 5.2 mmol/L   Chloride 104 97 - 108 mmol/L   CO2 26 18 - 29 mmol/L   Calcium 9.3 8.6 - 10.2 mg/dL  Microalbumin, urine  Result Value Ref Range   Microalbum.,U,Random 6.1 0.0 - 17.0 ug/mL  Hemoglobin A1c  Result Value Ref Range   Hgb A1c MFr Bld 6.2 (H) 4.8 - 5.6 %   Est. average glucose Bld gHb Est-mCnc 131 mg/dL  PSA  Result Value Ref Range   PSA 7.1 (H) 0.0 - 4.0 ng/mL

## 2014-10-18 NOTE — Progress Notes (Signed)
   Subjective:    Patient ID: Chad Martinez, male    DOB: 16-Sep-1934, 80 y.o.   MRN: [id number]  HPI Patient is here today for a check up.  He had BW done on 4/1.  When pt eats develops gas, belches several times annd spicy, , no burning, but sig belching  Has an enlarging left toenail that bothers him considerably has turn blue recently he's a bit worried about it  No chest pain.  Compliant with lipid medication. No obvious side effects has cut that down diet.  Compliant with blood pressure medicine. Due to see a cardiologist soon No concerns.   Results for orders placed or performed in visit on 10/05/14  Lipid panel  Result Value Ref Range   Cholesterol, Total 94 (L) 100 - 199 mg/dL   Triglycerides 83 0 - 149 mg/dL   HDL 39 (L) >39 mg/dL   VLDL Cholesterol Cal 17 5 - 40 mg/dL   LDL Calculated 38 0 - 99 mg/dL   Chol/HDL Ratio 2.4 0.0 - 5.0 ratio units  Hepatic function panel  Result Value Ref Range   Total Protein 6.6 6.0 - 8.5 g/dL   Albumin 4.6 3.5 - 4.8 g/dL   Bilirubin Total 0.9 0.0 - 1.2 mg/dL   Bilirubin, Direct 0.27 0.00 - 0.40 mg/dL   Alkaline Phosphatase 52 39 - 117 IU/L   AST 20 0 - 40 IU/L   ALT 18 0 - 44 IU/L  Basic metabolic panel  Result Value Ref Range   Glucose 144 (H) 65 - 99 mg/dL   BUN 20 8 - 27 mg/dL   Creatinine, Ser 1.15 0.76 - 1.27 mg/dL   GFR calc non Af Amer 61 >59 mL/min/1.73   GFR calc Af Amer 71 >59 mL/min/1.73   BUN/Creatinine Ratio 17 10 - 22   Sodium 144 134 - 144 mmol/L   Potassium 4.4 3.5 - 5.2 mmol/L   Chloride 104 97 - 108 mmol/L   CO2 26 18 - 29 mmol/L   Calcium 9.3 8.6 - 10.2 mg/dL  Microalbumin, urine  Result Value Ref Range   Microalbum.,U,Random 6.1 0.0 - 17.0 ug/mL  Hemoglobin A1c  Result Value Ref Range   Hgb A1c MFr Bld 6.2 (H) 4.8 - 5.6 %   Est. average glucose Bld gHb Est-mCnc 131 mg/dL  PSA  Result Value Ref Range   PSA 7.1 (H) 0.0 - 4.0 ng/mL     Review of Systems No headache no chest pain no back pain  abdominal pain no change in bowel habits no blood in stool    Objective:   Physical Exam  Alert no apparent distress lungs clear. Heart rare rhythm H&T normal ankles without edema left great toe onychomycosis with a couple superficial hemorrhages beneath pulses good some venous stasis changes noted      Assessment & Pl  Impression #1 hyperlipidemia good control #2 rising PSA strongly encouraged to get back with his specialist #3 status post pacer stable per patient #4 hypertension good control #5 coronary artery disease stable plan diet exercise discussed. Encouraged to see urologist. Merton Border today. Maintain same medications. Recheck every 6 months WSL

## 2014-11-29 ENCOUNTER — Encounter: Payer: Self-pay | Admitting: Cardiovascular Disease

## 2014-11-29 ENCOUNTER — Ambulatory Visit (INDEPENDENT_AMBULATORY_CARE_PROVIDER_SITE_OTHER): Payer: Medicare Other | Admitting: Cardiovascular Disease

## 2014-11-29 VITALS — BP 136/80 | HR 81 | Ht 67.0 in | Wt 188.0 lb

## 2014-11-29 DIAGNOSIS — Z950 Presence of cardiac pacemaker: Secondary | ICD-10-CM | POA: Diagnosis not present

## 2014-11-29 DIAGNOSIS — I2510 Atherosclerotic heart disease of native coronary artery without angina pectoris: Secondary | ICD-10-CM

## 2014-11-29 DIAGNOSIS — E785 Hyperlipidemia, unspecified: Secondary | ICD-10-CM | POA: Diagnosis not present

## 2014-11-29 DIAGNOSIS — I10 Essential (primary) hypertension: Secondary | ICD-10-CM

## 2014-11-29 MED ORDER — SIMVASTATIN 80 MG PO TABS: 80.0000 mg | ORAL_TABLET | Freq: Every day | ORAL | Status: DC

## 2014-11-29 MED ORDER — METOPROLOL TARTRATE 25 MG PO TABS: 25.0000 mg | ORAL_TABLET | Freq: Two times a day (BID) | ORAL | Status: DC

## 2014-11-29 MED ORDER — NITROGLYCERIN 0.4 MG SL SUBL: 0.4000 mg | SUBLINGUAL_TABLET | SUBLINGUAL | Status: DC | PRN

## 2014-11-29 NOTE — Progress Notes (Signed)
11/29/2014 JANIE STROTHMAN   19-Sep-1934  [id number]  Primary Physician Mickie Hillier, MD Primary Cardiologist: Lorretta Harp MD [name]   HPI: The patient is a very pleasant 80 year old mildly overweight married Caucasian male, father of 66, who is accompanied by his wife today. I last saw him in the office a year ago. He has a history of CAD, status post coronary artery bypass grafting x6, April 20, 2007, by Dr. Gilford Raid. His other problems include remote tobacco abuse and hyperlipidemia. He is asymptomatic. He vacations 6 months of the year in [place], [place], near [place]. He had an episode of syncope with high-grade second-degree AV block requiring dual-chamber pacemaker implantation by Dr. Sanda Klein with a Medtronic Adapta device. Dr. Sallyanne Kuster last saw him in the office in June of last year. The patient has remote interrogation via [facility] and is scheduled to see Dr. Sallyanne Kuster back in the office this coming August . He did have a Myoview stress test performed in the office March 2014, which was completely normal. Since I saw him one year ago he remains currently stable and is asymptomatic. His primary care physician because his lipid profile which most recently performed 10/07/14 revealing LDL 38 and HDL of 39.   Current Outpatient Prescriptions  Medication Sig Dispense Refill  . aspirin EC 81 MG tablet Take 81 mg by mouth daily.    . metoprolol tartrate (LOPRESSOR) 25 MG tablet Take 1 tablet (25 mg total) by mouth 2 (two) times daily. 180 tablet 3  . nitroGLYCERIN (NITROSTAT) 0.4 MG SL tablet Place 1 tablet (0.4 mg total) under the tongue every 5 (five) minutes as needed for chest pain. 25 tablet 2  . simvastatin (ZOCOR) 80 MG tablet Take 1 tablet (80 mg total) by mouth at bedtime. 90 tablet 3   No current facility-administered medications for this visit.    No Known Allergies  History   Social History  . Marital Status: Married    Spouse Name: N/A    . Number of Children: N/A  . Years of Education: N/A   Occupational History  . Not on file.   Social History Main Topics  . Smoking status: Former Smoker -- 3.00 packs/day for 20 years    Quit date: 12/19/1974  . Smokeless tobacco: Never Used  . Alcohol Use: No  . Drug Use: No  . Sexual Activity: Not on file   Other Topics Concern  . Not on file   Social History Narrative     Review of Systems: General: negative for chills, fever, night sweats or weight changes.  Cardiovascular: negative for chest pain, dyspnea on exertion, edema, orthopnea, palpitations, paroxysmal nocturnal dyspnea or shortness of breath Dermatological: negative for rash Respiratory: negative for cough or wheezing Urologic: negative for hematuria Abdominal: negative for nausea, vomiting, diarrhea, bright red blood per rectum, melena, or hematemesis Neurologic: negative for visual changes, syncope, or dizziness All other systems reviewed and are otherwise negative except as noted above.    Blood pressure 136/80, pulse 81, height 5\' 7"  (1.702 m), weight 188 lb (85.276 kg).  General appearance: alert and no distress Neck: no adenopathy, no carotid bruit, no JVD, supple, symmetrical, trachea midline and thyroid not enlarged, symmetric, no tenderness/mass/nodules Lungs: clear to auscultation bilaterally Heart: regular rate and rhythm, S1, S2 normal, no murmur, click, rub or gallop Extremities: extremities normal, atraumatic, no cyanosis or edema  EKG normal sinus rhythm at 81 with incomplete right bundle branch block. I personally  reviewed this EKG  ASSESSMENT AND PLAN:   Pacemaker History of Medtronic Adapta pacemaker implanted 12/11/11 for high-grade second-degree AV block followed by Dr. Sallyanne Kuster. He has clearly trilling checks remotely and scheduled to see Dr. Sallyanne Kuster in August for pacer evaluation   Hyperlipemia History of hyperlipidemia on suicide and 80 mg a day followed by his PCP. His most  recent lipid profile performed 10/07/14 revealed LDL 38 and HDL of 39.   HTN (hypertension) History of hypertension blood pressure is 136/80. He is on Lopressor. Continue current meds at current dosing   CAD (coronary artery disease) s/p CABG 2008 (LIMA to LAD, sequential SVG to OM2 and OM3, sequential SVG to PDA, PLV 1 and PLV2) History of CAD status post coronary artery bypass grafting  X 6 performed by Dr. Cyndia Bent 04/20/07. He had a Myoview stress test performed in March 2014 which is completely normal. He denies chest pain or shortness of breath.       Lorretta Harp MD FACP,FACC,FAHA, [facility] 11/29/2014 11:39 AM v

## 2014-11-29 NOTE — Assessment & Plan Note (Signed)
History of hypertension blood pressure is 136/80. He is on Lopressor. Continue current meds at current dosing

## 2014-11-29 NOTE — Patient Instructions (Signed)
Dr Berry recommends that you schedule a follow-up appointment in 1 year. You will receive a reminder letter in the mail two months in advance. If you don't receive a letter, please call our office to schedule the follow-up appointment. 

## 2014-11-29 NOTE — Assessment & Plan Note (Signed)
History of hyperlipidemia on suicide and 80 mg a day followed by his PCP. His most recent lipid profile performed 10/07/14 revealed LDL 38 and HDL of 39.

## 2014-11-29 NOTE — Assessment & Plan Note (Signed)
History of CAD status post coronary artery bypass grafting  X 6 performed by Dr. Cyndia Bent 04/20/07. He had a Myoview stress test performed in March 2014 which is completely normal. He denies chest pain or shortness of breath.

## 2014-11-29 NOTE — Assessment & Plan Note (Signed)
History of Medtronic Adapta pacemaker implanted 12/11/11 for high-grade second-degree AV block followed by Dr. Sallyanne Kuster. He has clearly trilling checks remotely and scheduled to see Dr. Sallyanne Kuster in August for pacer evaluation

## 2014-12-08 ENCOUNTER — Ambulatory Visit (INDEPENDENT_AMBULATORY_CARE_PROVIDER_SITE_OTHER): Payer: Medicare Other | Admitting: *Deleted

## 2014-12-08 DIAGNOSIS — I441 Atrioventricular block, second degree: Secondary | ICD-10-CM | POA: Diagnosis not present

## 2014-12-08 NOTE — Progress Notes (Signed)
Remote pacemaker transmission.   

## 2014-12-13 LAB — CUP PACEART REMOTE DEVICE CHECK
Battery Impedance: 159 Ohm
Battery Voltage: 2.79 V
Brady Statistic AP VP Percent: 0 %
Brady Statistic AS VP Percent: 0 %
Date Time Interrogation Session: 20160602102846
Lead Channel Impedance Value: 675 Ohm
Lead Channel Pacing Threshold Pulse Width: 0.4 ms
Lead Channel Pacing Threshold Pulse Width: 0.4 ms
Lead Channel Sensing Intrinsic Amplitude: 8 mV
Lead Channel Setting Pacing Amplitude: 1.5 V
Lead Channel Setting Pacing Pulse Width: 0.46 ms
Lead Channel Setting Sensing Sensitivity: 2.8 mV
MDC IDC MSMT BATTERY REMAINING LONGEVITY: 154 mo
MDC IDC MSMT LEADCHNL RA IMPEDANCE VALUE: 494 Ohm
MDC IDC MSMT LEADCHNL RA PACING THRESHOLD AMPLITUDE: 0.625 V
MDC IDC MSMT LEADCHNL RA SENSING INTR AMPL: 2.8 mV
MDC IDC MSMT LEADCHNL RV PACING THRESHOLD AMPLITUDE: 0.375 V
MDC IDC SET LEADCHNL RV PACING AMPLITUDE: 2 V
MDC IDC STAT BRADY AP VS PERCENT: 39 %
MDC IDC STAT BRADY AS VS PERCENT: 61 %

## 2014-12-20 DIAGNOSIS — R972 Elevated prostate specific antigen [PSA]: Secondary | ICD-10-CM | POA: Diagnosis not present

## 2014-12-20 DIAGNOSIS — Q5564 Hidden penis: Secondary | ICD-10-CM | POA: Diagnosis not present

## 2014-12-27 ENCOUNTER — Encounter: Payer: Self-pay | Admitting: Cardiology

## 2014-12-28 ENCOUNTER — Encounter: Payer: Self-pay | Admitting: Cardiovascular Disease

## 2015-01-06 DIAGNOSIS — R972 Elevated prostate specific antigen [PSA]: Secondary | ICD-10-CM | POA: Diagnosis not present

## 2015-01-06 DIAGNOSIS — R3911 Hesitancy of micturition: Secondary | ICD-10-CM | POA: Diagnosis not present

## 2015-01-06 DIAGNOSIS — N401 Enlarged prostate with lower urinary tract symptoms: Secondary | ICD-10-CM | POA: Diagnosis not present

## 2015-03-03 DIAGNOSIS — H338 Other retinal detachments: Secondary | ICD-10-CM | POA: Diagnosis not present

## 2015-03-07 ENCOUNTER — Ambulatory Visit (INDEPENDENT_AMBULATORY_CARE_PROVIDER_SITE_OTHER): Payer: Medicare Other | Admitting: Cardiovascular Disease

## 2015-03-07 ENCOUNTER — Encounter: Payer: Self-pay | Admitting: Cardiovascular Disease

## 2015-03-07 VITALS — BP 138/71 | HR 86 | Resp 16 | Ht 67.0 in | Wt 190.0 lb

## 2015-03-07 DIAGNOSIS — I10 Essential (primary) hypertension: Secondary | ICD-10-CM

## 2015-03-07 DIAGNOSIS — Z950 Presence of cardiac pacemaker: Secondary | ICD-10-CM | POA: Diagnosis not present

## 2015-03-07 DIAGNOSIS — R001 Bradycardia, unspecified: Secondary | ICD-10-CM | POA: Diagnosis not present

## 2015-03-07 DIAGNOSIS — I441 Atrioventricular block, second degree: Secondary | ICD-10-CM

## 2015-03-07 DIAGNOSIS — I2510 Atherosclerotic heart disease of native coronary artery without angina pectoris: Secondary | ICD-10-CM

## 2015-03-07 NOTE — Progress Notes (Signed)
Patient ID: Chad Martinez, male   DOB: 1935-04-04, 80 y.o.   MRN: [id number]     Cardiology Office Note   Date:  03/07/2015   ID:  CHON BUHL, DOB [birth year], MRN [id number]  PCP:  Mickie Hillier, MD  Cardiologist:  Quay Burow, MD; Sanda Klein, MD   Chief Complaint  Patient presents with  . Annual Exam    Patient has no complaints.      History of Present Illness: Chad Martinez is a 80 y.o. male who presents for pacemaker check (device implanted for high-grade second-degree AV block in 2013).  Pacemaker check shows normal device function with an estimated longevity of over 12 years. He has 41% atrial pacing and never has ventricular pacing. His device has recorded rare episodes of paroxysmal atrial tachycardia at around 170 bpm, the longest lasting for 67 seconds.  He has a history of CAD, status post coronary artery bypass grafting x6, April 20, 2007, by Dr. Gilford Raid. Normal nuclear perfusion study in March 2014. Normal left ventricular systolic function. His other problems include remote tobacco abuse, HTN and hyperlipidemia. He vacations 6 months of the year in [place], [place], near [place]. He had an episode of syncope with high-grade second-degree AV block requiring dual-chamber pacemaker implantation with a Medtronic Adapta device  Past Medical History  Diagnosis Date  . Hypertension   . Coronary artery disease     s/p CABG x 6  . Enlarged prostate   . Detached retina     Partial  . Reflux   . Colon polyps   . Elevated PSA   . Syncope     Permanent Pacemaker 12/11/2011 MDT Adapta  . Hyperlipidemia   . Pneumothorax 12/10/2011    right rib fx after fall from ladder    Past Surgical History  Procedure Laterality Date  . Bypass graft  10/08    LIMA to LAD,SVG to 2nd & 3rd obtuse marginal CX,SVG to PDA,1st posterolateral and 2nd posterolateral RCA  . Coronary artery bypass graft  04/19/2012  . Eye surgery  2004  . Skin graft  2012  . Hemorroidectomy      . Permanent pacemaker insertion  12/11/2011    MDT Adapta  . US echocardiography  10/14/2007    mild mitral annular ca+,EF 55%,AOV mildly sclerotic  . Nm myoview ltd  11/05/2010    no ischemia  . Colonoscopy N/A 01/25/2013    Procedure: COLONOSCOPY;  Surgeon: Daneil Dolin, MD;  Location: [facility];  Service: Endoscopy;  Laterality: N/A;  8:30 Am  . Permanent pacemaker insertion N/A 12/11/2011    Procedure: PERMANENT PACEMAKER INSERTION;  Surgeon: Sanda Klein, MD;  Location: [facility];  Service: Cardiovascular;  Laterality: N/A;     Current Outpatient Prescriptions  Medication Sig Dispense Refill  . aspirin EC 81 MG tablet Take 81 mg by mouth daily.    . metoprolol tartrate (LOPRESSOR) 25 MG tablet Take 1 tablet (25 mg total) by mouth 2 (two) times daily. 180 tablet 3  . nitroGLYCERIN (NITROSTAT) 0.4 MG SL tablet Place 1 tablet (0.4 mg total) under the tongue every 5 (five) minutes as needed for chest pain. 25 tablet 2  . simvastatin (ZOCOR) 80 MG tablet Take 1 tablet (80 mg total) by mouth at bedtime. 90 tablet 3   No current facility-administered medications for this visit.    Allergies:   Review of patient's allergies indicates no known allergies.    Social History:  The patient  reports that he quit smoking about 40 years ago. He has never used smokeless tobacco. He reports that he does not drink alcohol or use illicit drugs.   Family History:  The patient's family history includes Lung cancer in his brother; Multiple sclerosis in his father; Pneumonia in his mother.    ROS:  Please see the history of present illness.    Otherwise, review of systems positive for none.   All other systems are reviewed and negative.    PHYSICAL EXAM: VS:  BP 138/71 mmHg  Pulse 86  Resp 16  Ht 5\' 7"  (1.702 m)  Wt 190 lb (86.183 kg)  BMI 29.75 kg/m2 , BMI Body mass index is 29.75 kg/(m^2).  General: Alert, oriented x3, no distress Head: no evidence of trauma, PERRL, EOMI, no  exophtalmos or lid lag, no myxedema, no xanthelasma; normal ears, nose and oropharynx Neck: normal jugular venous pulsations and no hepatojugular reflux; brisk carotid pulses without delay and no carotid bruits Chest: clear to auscultation, no signs of consolidation by percussion or palpation, normal fremitus, symmetrical and full respiratory excursions, healthy left subclavian pacemaker site Cardiovascular: normal position and quality of the apical impulse, regular rhythm, normal first and second heart sounds, no murmurs, rubs or gallops Abdomen: no tenderness or distention, no masses by palpation, no abnormal pulsatility or arterial bruits, normal bowel sounds, no hepatosplenomegaly Extremities: no clubbing, cyanosis or edema; 2+ radial, ulnar and brachial pulses bilaterally; 2+ right femoral, posterior tibial and dorsalis pedis pulses; 2+ left femoral, posterior tibial and dorsalis pedis pulses; no subclavian or femoral bruits Neurological: grossly nonfocal Psych: euthymic mood, full affect   EKG:  EKG is not ordered today.   Recent Labs: 10/07/2014: ALT 18; BUN 20; Creatinine, Ser 1.15; Potassium 4.4; Sodium 144    Lipid Panel    Component Value Date/Time   CHOL 94* 10/07/2014 0824   CHOL 95 01/31/2014 0714   TRIG 83 10/07/2014 0824   HDL 39* 10/07/2014 0824   HDL 34* 01/31/2014 0714   CHOLHDL 2.4 10/07/2014 0824   CHOLHDL 2.8 01/31/2014 0714   VLDL 24 01/31/2014 0714   LDLCALC 38 10/07/2014 0824   LDLCALC 37 01/31/2014 0714      Wt Readings from Last 3 Encounters:  03/07/15 190 lb (86.183 kg)  11/29/14 188 lb (85.276 kg)  10/18/14 192 lb (87.091 kg)      ASSESSMENT AND PLAN:  CAD (coronary artery disease) s/p CABG 2008 (LIMA to LAD, sequential SVG to OM2 and OM3, sequential SVG to PDA, PLV 1 and PLV2)  Asymptomatic. Recent normal perfusion study. The focus is on risk factor modification. HTN (hypertension)  Eell-controlled on current medications  Mobitz type 2  second degree AV block Status post permanent pacemaker  Normal device function. Continue [facility] checks every 3 months.  Hyperlipidemia  His lipid parameters are excellent except for the low HDL of 39, which is actually a further improvement from last year. Weight loss and regular exercise will help.     Current medicines are reviewed at length with the patient today.  The patient does not have concerns regarding medicines.  The following changes have been made:  no change  Labs/ tests ordered today include:  No orders of the defined types were placed in this encounter.     Patient Instructions  Remote monitoring is used to monitor your Pacemaker or ICD from home. This monitoring reduces the number of office visits required to check your device to one time per year. It  allows [place] to monitor the functioning of your device to ensure it is working properly. You are scheduled for a device check from home on June 07, 2015. You may send your transmission at any time that day. If you have a wireless device, the transmission will be sent automatically. After your physician reviews your transmission, you will receive a postcard with your next transmission date.   Dr. Sallyanne Kuster recommends that you schedule a follow-up appointment in: ONE YEAR.       Mikael Spray, MD  03/07/2015 2:32 PM    Sanda Klein, MD, [facility] [phone] office [phone] pager

## 2015-03-07 NOTE — Patient Instructions (Signed)
Remote monitoring is used to monitor your Pacemaker or ICD from home. This monitoring reduces the number of office visits required to check your device to one time per year. It allows [place] to monitor the functioning of your device to ensure it is working properly. You are scheduled for a device check from home on June 07, 2015. You may send your transmission at any time that day. If you have a wireless device, the transmission will be sent automatically. After your physician reviews your transmission, you will receive a postcard with your next transmission date.   Dr. Sallyanne Kuster recommends that you schedule a follow-up appointment in: ONE YEAR.

## 2015-03-08 ENCOUNTER — Telehealth: Payer: Self-pay | Admitting: Cardiovascular Disease

## 2015-03-08 LAB — CUP PACEART INCLINIC DEVICE CHECK
Battery Impedance: 159 Ohm
Brady Statistic AP VS Percent: 41 %
Brady Statistic AS VP Percent: 0 %
Brady Statistic AS VS Percent: 59 %
Lead Channel Impedance Value: 693 Ohm
Lead Channel Pacing Threshold Amplitude: 0.375 V
Lead Channel Pacing Threshold Pulse Width: 0.4 ms
Lead Channel Pacing Threshold Pulse Width: 0.4 ms
Lead Channel Sensing Intrinsic Amplitude: 5.6 mV
Lead Channel Setting Pacing Amplitude: 1.5 V
Lead Channel Setting Pacing Amplitude: 2 V
Lead Channel Setting Sensing Sensitivity: 4 mV
MDC IDC MSMT BATTERY REMAINING LONGEVITY: 153 mo
MDC IDC MSMT BATTERY VOLTAGE: 2.79 V
MDC IDC MSMT LEADCHNL RA IMPEDANCE VALUE: 518 Ohm
MDC IDC MSMT LEADCHNL RA PACING THRESHOLD AMPLITUDE: 0.625 V
MDC IDC MSMT LEADCHNL RA SENSING INTR AMPL: 2.8 mV
MDC IDC SESS DTM: 20160830120357
MDC IDC SET LEADCHNL RV PACING PULSEWIDTH: 0.46 ms
MDC IDC STAT BRADY AP VP PERCENT: 0 %

## 2015-03-08 NOTE — Telephone Encounter (Signed)
Agree with your recommendation 

## 2015-03-08 NOTE — Telephone Encounter (Signed)
Pt's wife called in stating that the pt stating started having pain in his shoulders and pain. She would like for the pt to be checked out in the office as soon as possible. Please call  Thanks

## 2015-03-08 NOTE — Telephone Encounter (Signed)
Spoke with patient who saw Dr. Loletha Grayer yesterday with no cardiac complaints. Patient reports he is "doing fine" but then put his wife on the phone who reports that patient woke up this AM with left shoulder and neck pain. He is unsure if he slept on his shoulder wrong. He does not report that the pain is reproducible with movement - such as moving left arm or turning his head. He denies chest pain, denies shortness of breath, denies lightheadedness/dizziness. His wife reports his BP was 188/80s this AM but is down to 136/73. He is concerned as this pain is similar to what he had prior to having his CABG and this was his only symptom. Patient has not taken NTG for this pain. This pain is not constant, described as an aching feeling.   Advised wife that patient should seek ED eval for symptoms given his cardiac history. Advised that should she take him to ED, to call and let us know so we can make the [facility] ED charge nurse aware. Informed wife that patient can take NTG as needed for pain.  Routed to MD to review and advise further as necessary.

## 2015-03-09 ENCOUNTER — Telehealth: Payer: Self-pay | Admitting: Family Medicine

## 2015-03-09 DIAGNOSIS — E785 Hyperlipidemia, unspecified: Secondary | ICD-10-CM

## 2015-03-09 DIAGNOSIS — R7301 Impaired fasting glucose: Secondary | ICD-10-CM

## 2015-03-09 DIAGNOSIS — Z139 Encounter for screening, unspecified: Secondary | ICD-10-CM

## 2015-03-09 DIAGNOSIS — Z79899 Other long term (current) drug therapy: Secondary | ICD-10-CM

## 2015-03-09 NOTE — Telephone Encounter (Signed)
Pt is wanting lab orders to be sent over for his upcoming appt. Last labs per epic were: lipid,hepatic,bmp,microalbumin,a1c,and psa on 10/07/14

## 2015-03-09 NOTE — Telephone Encounter (Signed)
Spoke with patient and informed him that labs were ordered. Patient verbalized understanding.

## 2015-03-09 NOTE — Telephone Encounter (Signed)
Lip liv A1c 

## 2015-03-10 DIAGNOSIS — E785 Hyperlipidemia, unspecified: Secondary | ICD-10-CM | POA: Diagnosis not present

## 2015-03-10 DIAGNOSIS — R7301 Impaired fasting glucose: Secondary | ICD-10-CM | POA: Diagnosis not present

## 2015-03-10 DIAGNOSIS — Z79899 Other long term (current) drug therapy: Secondary | ICD-10-CM | POA: Diagnosis not present

## 2015-03-11 LAB — LIPID PANEL
CHOL/HDL RATIO: 2.6 ratio (ref 0.0–5.0)
Cholesterol, Total: 90 mg/dL — ABNORMAL LOW (ref 100–199)
HDL: 34 mg/dL — ABNORMAL LOW (ref >39)
LDL CALC: 34 mg/dL (ref 0–99)
Triglycerides: 110 mg/dL (ref 0–149)
VLDL Cholesterol Cal: 22 mg/dL (ref 5–40)

## 2015-03-11 LAB — HEPATIC FUNCTION PANEL
ALBUMIN: 4.3 g/dL (ref 3.5–4.8)
ALT: 17 IU/L (ref 0–44)
AST: 17 IU/L (ref 0–40)
Alkaline Phosphatase: 53 IU/L (ref 39–117)
Bilirubin Total: 1.2 mg/dL (ref 0.0–1.2)
Bilirubin, Direct: 0.34 mg/dL (ref 0.00–0.40)
Total Protein: 6.5 g/dL (ref 6.0–8.5)

## 2015-03-11 LAB — HEMOGLOBIN A1C
Est. average glucose Bld gHb Est-mCnc: 131 mg/dL
Hgb A1c MFr Bld: 6.2 % — ABNORMAL HIGH (ref 4.8–5.6)

## 2015-03-17 ENCOUNTER — Encounter: Payer: Self-pay | Admitting: Family Medicine

## 2015-03-17 ENCOUNTER — Ambulatory Visit (INDEPENDENT_AMBULATORY_CARE_PROVIDER_SITE_OTHER): Payer: Medicare Other | Admitting: Family Medicine

## 2015-03-17 VITALS — BP 126/82 | Ht 67.0 in | Wt 192.5 lb

## 2015-03-17 DIAGNOSIS — R7301 Impaired fasting glucose: Secondary | ICD-10-CM

## 2015-03-17 DIAGNOSIS — I2510 Atherosclerotic heart disease of native coronary artery without angina pectoris: Secondary | ICD-10-CM | POA: Diagnosis not present

## 2015-03-17 DIAGNOSIS — R972 Elevated prostate specific antigen [PSA]: Secondary | ICD-10-CM

## 2015-03-17 DIAGNOSIS — E785 Hyperlipidemia, unspecified: Secondary | ICD-10-CM | POA: Diagnosis not present

## 2015-03-17 DIAGNOSIS — I10 Essential (primary) hypertension: Secondary | ICD-10-CM | POA: Diagnosis not present

## 2015-03-17 DIAGNOSIS — Z23 Encounter for immunization: Secondary | ICD-10-CM | POA: Diagnosis not present

## 2015-03-17 MED ORDER — METOPROLOL TARTRATE 25 MG PO TABS: 25.0000 mg | ORAL_TABLET | Freq: Two times a day (BID) | ORAL | Status: DC

## 2015-03-17 MED ORDER — SIMVASTATIN 80 MG PO TABS: 80.0000 mg | ORAL_TABLET | Freq: Every day | ORAL | Status: DC

## 2015-03-17 NOTE — Progress Notes (Signed)
   Subjective:    Patient ID: Chad Martinez, male    DOB: August 26, 1934, 80 y.o.   MRN: [id number]  Hypertension This is a chronic problem. The current episode started more than 1 year ago. The problem has been gradually improving since onset. There are no associated agents to hypertension. There are no known risk factors for coronary artery disease. Treatments tried: metoprolol. The current treatment provides moderate improvement. There are no compliance problems.     Patient states that he has a lot of gas problems. This has been present for about 10 years now. Some associated belching. No major reflux symptoms. Wonders if he needs to take medicine for this.  Claims compliance with lipid medication. No obvious side effects. Has cut down fats in his diet.  Ongoing concerns regarding his sugar. Has cut down sugar. Loss of bit overweight not a lot. There is some family history of diabetes.    Review of Systems No headache no chest pain and back pain abdominal pain no change in bowel habits no blood in stool    Objective:   Physical Exam  Alert vital stable HEENT normal neck supple. Lungs clear heart regular in rhythm abdomen benign ankles without edema      Assessment & Plan:  Impression 1 hypertension good control #2 hyperlipidemia results discussed great control. #3 reflux discussed minimal symptom care discussed #4 impaired fasting glucose ongoing challenge. A1c up to 6.2 plan diet exercise discussed. All medications refilled. Maintain same meds. Immunizations discussed flu shot given WSL

## 2015-03-20 DIAGNOSIS — N403 Nodular prostate with lower urinary tract symptoms: Secondary | ICD-10-CM | POA: Diagnosis not present

## 2015-03-20 DIAGNOSIS — R3911 Hesitancy of micturition: Secondary | ICD-10-CM | POA: Diagnosis not present

## 2015-03-20 DIAGNOSIS — R972 Elevated prostate specific antigen [PSA]: Secondary | ICD-10-CM | POA: Diagnosis not present

## 2015-03-20 DIAGNOSIS — N138 Other obstructive and reflux uropathy: Secondary | ICD-10-CM | POA: Insufficient documentation

## 2015-03-21 ENCOUNTER — Encounter: Payer: Self-pay | Admitting: Cardiovascular Disease

## 2015-04-05 ENCOUNTER — Ambulatory Visit: Payer: Medicare Other | Admitting: Family Medicine

## 2015-06-06 ENCOUNTER — Ambulatory Visit (INDEPENDENT_AMBULATORY_CARE_PROVIDER_SITE_OTHER): Payer: Medicare Other | Admitting: *Deleted

## 2015-06-06 DIAGNOSIS — I441 Atrioventricular block, second degree: Secondary | ICD-10-CM | POA: Diagnosis not present

## 2015-06-08 NOTE — Progress Notes (Signed)
Remote pacemaker transmission.   

## 2015-06-15 LAB — CUP PACEART REMOTE DEVICE CHECK
Battery Remaining Longevity: 148 mo
Brady Statistic AP VS Percent: 41 %
Brady Statistic AS VP Percent: 0 %
Brady Statistic AS VS Percent: 59 %
Implantable Lead Implant Date: 20130605
Implantable Lead Location: 753860
Lead Channel Impedance Value: 503 Ohm
Lead Channel Impedance Value: 730 Ohm
Lead Channel Setting Pacing Amplitude: 1.5 V
Lead Channel Setting Pacing Amplitude: 2 V
Lead Channel Setting Pacing Pulse Width: 0.4 ms
MDC IDC LEAD IMPLANT DT: 20130605
MDC IDC LEAD LOCATION: 753859
MDC IDC MSMT BATTERY IMPEDANCE: 183 Ohm
MDC IDC MSMT BATTERY VOLTAGE: 2.79 V
MDC IDC SESS DTM: 20161129122611
MDC IDC SET LEADCHNL RV SENSING SENSITIVITY: 4 mV
MDC IDC STAT BRADY AP VP PERCENT: 0 %

## 2015-06-16 ENCOUNTER — Encounter: Payer: Self-pay | Admitting: Cardiology

## 2015-09-05 ENCOUNTER — Ambulatory Visit (INDEPENDENT_AMBULATORY_CARE_PROVIDER_SITE_OTHER): Payer: Medicare Other | Admitting: *Deleted

## 2015-09-05 DIAGNOSIS — Z950 Presence of cardiac pacemaker: Secondary | ICD-10-CM | POA: Diagnosis not present

## 2015-09-05 DIAGNOSIS — I441 Atrioventricular block, second degree: Secondary | ICD-10-CM

## 2015-09-05 NOTE — Progress Notes (Signed)
Remote pacemaker transmission.   

## 2015-10-02 ENCOUNTER — Telehealth: Payer: Self-pay | Admitting: Family Medicine

## 2015-10-02 DIAGNOSIS — E785 Hyperlipidemia, unspecified: Secondary | ICD-10-CM

## 2015-10-02 DIAGNOSIS — Z79899 Other long term (current) drug therapy: Secondary | ICD-10-CM

## 2015-10-02 DIAGNOSIS — R7309 Other abnormal glucose: Secondary | ICD-10-CM

## 2015-10-02 NOTE — Telephone Encounter (Signed)
Left message on voicemail notifying patient bloodwork has been ordered.  

## 2015-10-02 NOTE — Telephone Encounter (Signed)
Calling to request order for blood work for appointment on 10/10/15. °

## 2015-10-02 NOTE — Telephone Encounter (Signed)
Lip liuv m7 A1c

## 2015-10-05 DIAGNOSIS — R7309 Other abnormal glucose: Secondary | ICD-10-CM | POA: Diagnosis not present

## 2015-10-05 DIAGNOSIS — Z79899 Other long term (current) drug therapy: Secondary | ICD-10-CM | POA: Diagnosis not present

## 2015-10-05 DIAGNOSIS — E785 Hyperlipidemia, unspecified: Secondary | ICD-10-CM | POA: Diagnosis not present

## 2015-10-06 ENCOUNTER — Encounter: Payer: Self-pay | Admitting: Cardiology

## 2015-10-06 LAB — CUP PACEART REMOTE DEVICE CHECK
Battery Impedance: 183 Ohm
Battery Voltage: 2.79 V
Brady Statistic AP VS Percent: 39 %
Brady Statistic AS VP Percent: 0 %
Implantable Lead Implant Date: 20130605
Implantable Lead Location: 753860
Implantable Lead Model: 5076
Lead Channel Impedance Value: 503 Ohm
Lead Channel Impedance Value: 691 Ohm
Lead Channel Pacing Threshold Pulse Width: 0.4 ms
Lead Channel Sensing Intrinsic Amplitude: 2.8 mV
MDC IDC LEAD IMPLANT DT: 20130605
MDC IDC LEAD LOCATION: 753859
MDC IDC MSMT BATTERY REMAINING LONGEVITY: 148 mo
MDC IDC MSMT LEADCHNL RA PACING THRESHOLD AMPLITUDE: 0.5 V
MDC IDC MSMT LEADCHNL RV PACING THRESHOLD AMPLITUDE: 0.375 V
MDC IDC MSMT LEADCHNL RV PACING THRESHOLD PULSEWIDTH: 0.4 ms
MDC IDC MSMT LEADCHNL RV SENSING INTR AMPL: 8 mV
MDC IDC SESS DTM: 20170227131015
MDC IDC SET LEADCHNL RA PACING AMPLITUDE: 1.5 V
MDC IDC SET LEADCHNL RV PACING AMPLITUDE: 2 V
MDC IDC SET LEADCHNL RV PACING PULSEWIDTH: 0.4 ms
MDC IDC SET LEADCHNL RV SENSING SENSITIVITY: 4 mV
MDC IDC STAT BRADY AP VP PERCENT: 0 %
MDC IDC STAT BRADY AS VS PERCENT: 60 %

## 2015-10-06 LAB — BASIC METABOLIC PANEL
BUN / CREAT RATIO: 18 (ref 10–22)
BUN: 20 mg/dL (ref 8–27)
CHLORIDE: 103 mmol/L (ref 96–106)
CO2: 21 mmol/L (ref 18–29)
CREATININE: 1.1 mg/dL (ref 0.76–1.27)
Calcium: 8.8 mg/dL (ref 8.6–10.2)
GFR, EST AFRICAN AMERICAN: 74 mL/min/{1.73_m2} (ref >59)
GFR, EST NON AFRICAN AMERICAN: 64 mL/min/{1.73_m2} (ref >59)
Glucose: 129 mg/dL — ABNORMAL HIGH (ref 65–99)
Potassium: 4.2 mmol/L (ref 3.5–5.2)
Sodium: 144 mmol/L (ref 134–144)

## 2015-10-06 LAB — LIPID PANEL
CHOL/HDL RATIO: 3.4 ratio (ref 0.0–5.0)
Cholesterol, Total: 94 mg/dL — ABNORMAL LOW (ref 100–199)
HDL: 28 mg/dL — ABNORMAL LOW (ref >39)
LDL Calculated: 42 mg/dL (ref 0–99)
TRIGLYCERIDES: 118 mg/dL (ref 0–149)
VLDL Cholesterol Cal: 24 mg/dL (ref 5–40)

## 2015-10-06 LAB — HEPATIC FUNCTION PANEL
ALT: 23 IU/L (ref 0–44)
AST: 24 IU/L (ref 0–40)
Albumin: 4.4 g/dL (ref 3.5–4.8)
Alkaline Phosphatase: 56 IU/L (ref 39–117)
BILIRUBIN TOTAL: 0.8 mg/dL (ref 0.0–1.2)
BILIRUBIN, DIRECT: 0.21 mg/dL (ref 0.00–0.40)
Total Protein: 6.8 g/dL (ref 6.0–8.5)

## 2015-10-06 LAB — HEMOGLOBIN A1C
Est. average glucose Bld gHb Est-mCnc: 131 mg/dL
Hgb A1c MFr Bld: 6.2 % — ABNORMAL HIGH (ref 4.8–5.6)

## 2015-10-10 ENCOUNTER — Encounter: Payer: Self-pay | Admitting: Family Medicine

## 2015-10-10 ENCOUNTER — Ambulatory Visit (INDEPENDENT_AMBULATORY_CARE_PROVIDER_SITE_OTHER): Payer: Medicare Other | Admitting: Family Medicine

## 2015-10-10 VITALS — BP 122/82 | Ht 67.0 in | Wt 191.8 lb

## 2015-10-10 DIAGNOSIS — I10 Essential (primary) hypertension: Secondary | ICD-10-CM | POA: Diagnosis not present

## 2015-10-10 DIAGNOSIS — E785 Hyperlipidemia, unspecified: Secondary | ICD-10-CM

## 2015-10-10 DIAGNOSIS — R202 Paresthesia of skin: Secondary | ICD-10-CM

## 2015-10-10 DIAGNOSIS — R7301 Impaired fasting glucose: Secondary | ICD-10-CM | POA: Diagnosis not present

## 2015-10-10 MED ORDER — METOPROLOL TARTRATE 25 MG PO TABS: 25.0000 mg | ORAL_TABLET | Freq: Two times a day (BID) | ORAL | Status: DC

## 2015-10-10 MED ORDER — SIMVASTATIN 80 MG PO TABS: 80.0000 mg | ORAL_TABLET | Freq: Every day | ORAL | Status: DC

## 2015-10-10 NOTE — Progress Notes (Signed)
   Subjective:    Patient ID: Chad Martinez, male    DOB: [birth year], 81 y.o.   MRN: [id number] Patient arrives office with numerous concerns Hypertension This is a chronic problem. The current episode started more than 1 year ago. Risk factors for coronary artery disease include dyslipidemia and male gender. Treatments tried: lopressor.  compliant with blood pressure medicine does not miss a dose meds reviewed today.  Compliant with lipid medication. Does not miss a dose watching fat intake. Walking fairly regularly. Next  Tingling paresthesias in both feet. No weakness. No complete loss of sensation.   Claims fairly good attention to diet Results for orders placed or performed in visit on 10/02/15  Lipid panel  Result Value Ref Range   Cholesterol, Total 94 (L) 100 - 199 mg/dL   Triglycerides 118 0 - 149 mg/dL   HDL 28 (L) >39 mg/dL   VLDL Cholesterol Cal 24 5 - 40 mg/dL   LDL Calculated 42 0 - 99 mg/dL   Chol/HDL Ratio 3.4 0.0 - 5.0 ratio units  Hepatic function panel  Result Value Ref Range   Total Protein 6.8 6.0 - 8.5 g/dL   Albumin 4.4 3.5 - 4.8 g/dL   Bilirubin Total 0.8 0.0 - 1.2 mg/dL   Bilirubin, Direct 0.21 0.00 - 0.40 mg/dL   Alkaline Phosphatase 56 39 - 117 IU/L   AST 24 0 - 40 IU/L   ALT 23 0 - 44 IU/L  Basic metabolic panel  Result Value Ref Range   Glucose 129 (H) 65 - 99 mg/dL   BUN 20 8 - 27 mg/dL   Creatinine, Ser 1.10 0.76 - 1.27 mg/dL   GFR calc non Af Amer 64 >59 mL/min/1.73   GFR calc Af Amer 74 >59 mL/min/1.73   BUN/Creatinine Ratio 18 10 - 22   Sodium 144 134 - 144 mmol/L   Potassium 4.2 3.5 - 5.2 mmol/L   Chloride 103 96 - 106 mmol/L   CO2 21 18 - 29 mmol/L   Calcium 8.8 8.6 - 10.2 mg/dL  Hemoglobin A1c  Result Value Ref Range   Hgb A1c MFr Bld 6.2 (H) 4.8 - 5.6 %   Est. average glucose Bld gHb Est-mCnc 131 mg/dL   Six d per wk, exercising reg  Watching diet closely Discuss recent labs    Review of Systems No headache, no major  weight loss or weight gain, no chest pain no back pain abdominal pain no change in bowel habits complete ROS otherwise negative     Objective:   Physical Exam Alert no acute distress vital stable blood pressure good on repeat H&T normal lungs clear. Heart regular in rhythm. Feet pulses excellent sensation currently intact       Assessment & Plan:  Impression 1 hypertension good control discussed maintain same meds #2 hyperlipidemia good control discussed maintain same meds #3 URI discuss #4 paresthesias likely early nerve damage potentially related to #5 discussed #5 impaired fasting glucose ongoing challenge not better not worse plan diet exercise discussed. All medications refilled. Long-term implications discussed WSL

## 2015-10-16 DIAGNOSIS — N401 Enlarged prostate with lower urinary tract symptoms: Secondary | ICD-10-CM | POA: Diagnosis not present

## 2015-10-16 DIAGNOSIS — R972 Elevated prostate specific antigen [PSA]: Secondary | ICD-10-CM | POA: Diagnosis not present

## 2015-10-16 DIAGNOSIS — R3911 Hesitancy of micturition: Secondary | ICD-10-CM | POA: Diagnosis not present

## 2015-11-14 ENCOUNTER — Ambulatory Visit (INDEPENDENT_AMBULATORY_CARE_PROVIDER_SITE_OTHER): Payer: Medicare Other | Admitting: Cardiovascular Disease

## 2015-11-14 ENCOUNTER — Encounter: Payer: Self-pay | Admitting: Cardiovascular Disease

## 2015-11-14 DIAGNOSIS — I10 Essential (primary) hypertension: Secondary | ICD-10-CM

## 2015-11-14 MED ORDER — NITROGLYCERIN 0.4 MG SL SUBL: 0.4000 mg | SUBLINGUAL_TABLET | SUBLINGUAL | Status: DC | PRN

## 2015-11-14 MED ORDER — METOPROLOL TARTRATE 25 MG PO TABS: 25.0000 mg | ORAL_TABLET | Freq: Two times a day (BID) | ORAL | Status: DC

## 2015-11-14 NOTE — Assessment & Plan Note (Signed)
History of hyperlipidemia on statin therapy with recent lipid profile performed 10/05/15 revealing LDL 42 and HDL 28

## 2015-11-14 NOTE — Assessment & Plan Note (Signed)
History of hypertension with blood pressure measured at 134/70. He is on metoprolol. Continue current meds at current dosing

## 2015-11-14 NOTE — Progress Notes (Signed)
11/14/2015 Chad Martinez   1934/09/28  [id number]  Primary Physician Mickie Hillier, MD Primary Cardiologist: Lorretta Harp MD [name]   HPI:  The patient is a very pleasant 81 year old mildly overweight married Caucasian male, father of 43, who is accompanied by his wife today. I last saw him in the office 11/29/14. He has a history of CAD, status post coronary artery bypass grafting x6, April 20, 2007, by Dr. Gilford Raid. His other problems include remote tobacco abuse and hyperlipidemia. He is asymptomatic. He vacations 6 months of the year in [place], [place], near [place]. He had an episode of syncope with high-grade second-degree AV block requiring dual-chamber pacemaker implantation by Dr. Sanda Klein with a Medtronic Adapta device. Dr. Sallyanne Kuster last saw him in the office in June of last year. The patient has remote interrogation via [facility] and is scheduled to see Dr. Sallyanne Kuster back in the office this coming August . He did have a Myoview tress test performed in March 2014 which was nonischemic. He had blood work performed by his PCP 10/05/15 revealing an LDL 42 and an HDL of 28.   Current Outpatient Prescriptions  Medication Sig Dispense Refill  . aspirin EC 81 MG tablet Take 81 mg by mouth daily.    . metoprolol tartrate (LOPRESSOR) 25 MG tablet Take 1 tablet (25 mg total) by mouth 2 (two) times daily. 180 tablet 3  . nitroGLYCERIN (NITROSTAT) 0.4 MG SL tablet Place 1 tablet (0.4 mg total) under the tongue every 5 (five) minutes as needed for chest pain. 25 tablet 2  . simvastatin (ZOCOR) 80 MG tablet Take 1 tablet (80 mg total) by mouth at bedtime. 90 tablet 1   No current facility-administered medications for this visit.    No Known Allergies  Social History   Social History  . Marital Status: Married    Spouse Name: N/A  . Number of Children: N/A  . Years of Education: N/A   Occupational History  . Not on file.   Social History Main Topics    . Smoking status: Former Smoker -- 3.00 packs/day for 20 years    Quit date: 12/19/1974  . Smokeless tobacco: Never Used  . Alcohol Use: No  . Drug Use: No  . Sexual Activity: Not on file   Other Topics Concern  . Not on file   Social History Narrative     Review of Systems: General: negative for chills, fever, night sweats or weight changes.  Cardiovascular: negative for chest pain, dyspnea on exertion, edema, orthopnea, palpitations, paroxysmal nocturnal dyspnea or shortness of breath Dermatological: negative for rash Respiratory: negative for cough or wheezing Urologic: negative for hematuria Abdominal: negative for nausea, vomiting, diarrhea, bright red blood per rectum, melena, or hematemesis Neurologic: negative for visual changes, syncope, or dizziness All other systems reviewed and are otherwise negative except as noted above.    Blood pressure 134/70, pulse 86, height 5\' 7"  (1.702 m), weight 193 lb (87.544 kg).  General appearance: alert and no distress Neck: no adenopathy, no carotid bruit, no JVD, supple, symmetrical, trachea midline and thyroid not enlarged, symmetric, no tenderness/mass/nodules Lungs: clear to auscultation bilaterally Heart: regular rate and rhythm, S1, S2 normal, no murmur, click, rub or gallop Extremities: extremities normal, atraumatic, no cyanosis or edema  EKG atrially paced rhythm at 86. I personally reviewed this EKG  ASSESSMENT AND PLAN:   HTN (hypertension) History of hypertension with blood pressure measured at 134/70. He is on metoprolol. Continue  current meds at current dosing  Hyperlipemia History of hyperlipidemia on statin therapy with recent lipid profile performed 10/05/15 revealing LDL 42 and HDL 28  Pacemaker History of permanent transvenous pacemaker placed for high-grade second-degree AV block 12/11/11 followed in the office by Dr. Sallyanne Kuster. The patient's medication she shows that he is pacing today.  CAD (coronary artery  disease) s/p CABG 2008 (LIMA to LAD, sequential SVG to OM2 and OM3, sequential SVG to PDA, PLV 1 and PLV2) history of CAD status post coronary artery bypass grafting X6 04/20/07 by Dr. Cyndia Bent . His most recent Myoview performed March 2014 was completely normal. He denies chest pain or shortness of breath.      Lorretta Harp MD FACP,FACC,FAHA, [facility] 11/14/2015 10:42 AM

## 2015-11-14 NOTE — Assessment & Plan Note (Signed)
history of CAD status post coronary artery bypass grafting X6 04/20/07 by Dr. Cyndia Bent . His most recent Myoview performed March 2014 was completely normal. He denies chest pain or shortness of breath.

## 2015-11-14 NOTE — Assessment & Plan Note (Signed)
History of permanent transvenous pacemaker placed for high-grade second-degree AV block 12/11/11 followed in the office by Dr. Sallyanne Kuster. The patient's medication she shows that he is pacing today.

## 2015-11-14 NOTE — Patient Instructions (Signed)

## 2015-12-05 ENCOUNTER — Ambulatory Visit (INDEPENDENT_AMBULATORY_CARE_PROVIDER_SITE_OTHER): Payer: Medicare Other | Admitting: *Deleted

## 2015-12-05 DIAGNOSIS — I441 Atrioventricular block, second degree: Secondary | ICD-10-CM

## 2015-12-06 NOTE — Progress Notes (Signed)
Remote pacemaker transmission.   

## 2015-12-15 ENCOUNTER — Ambulatory Visit (INDEPENDENT_AMBULATORY_CARE_PROVIDER_SITE_OTHER): Payer: Medicare Other | Admitting: Family Medicine

## 2015-12-15 ENCOUNTER — Encounter: Payer: Self-pay | Admitting: Family Medicine

## 2015-12-15 VITALS — BP 136/82 | Ht 67.0 in | Wt 193.0 lb

## 2015-12-15 DIAGNOSIS — I2510 Atherosclerotic heart disease of native coronary artery without angina pectoris: Secondary | ICD-10-CM

## 2015-12-15 DIAGNOSIS — I2583 Coronary atherosclerosis due to lipid rich plaque: Secondary | ICD-10-CM

## 2015-12-15 DIAGNOSIS — M25512 Pain in left shoulder: Secondary | ICD-10-CM

## 2015-12-15 NOTE — Progress Notes (Signed)
   Subjective:    Patient ID: Chad Martinez, male    DOB: 08-22-1934, 81 y.o.   MRN: [id number] patient presents with significant concerns Shoulder Pain  This is a new problem. Episode onset: one week. Treatments tried: aleve. The treatment provided no relief.  Patient notes left shoulder pain. Posterior shoulder primarily. Though does radiate towards the top of the shoulder and even superior anterior chest at times. No associated nausea. No associated diaphoresis. No associated shortness of breath. No radiation left arm.  What makes the patient worried is that his presentation which led to his diagnosis of coronary artery disease in the first place averysimilarrightshoulderpain.Thereforethepatientpresentsthiseveningveryanxiousaboutthings  Pos pain in left shoulder  Heart  ekg neg saw the card doc, todl everything good  aleave not helping, but on further history he took going to Aleve stopped X  Has been doing more substantial chest and upper arm activities lately    Patient has had a similar discomfort in the left posterior shoulder, Came and go mysteriously in the past      Review of Systems As noted above otherwise negative    Objective:   Physical Exam Alert vital stable talkative 0 distress. Shoulder good range of motion no obvious tenderness. Scapular or suprascapular lungs clear. Heart regular in rhythm.  EKG normal sinus rhythm. No paced beats unlike the rhythm earlier this spring. Appears virtually identical to EKG of last year     Assessment & Plan:  Impression shoulder pain very nonspecific likely musculoskeletal. Patient certainly justified in his anxiety though with his past coronary history. Long discussion held. I Carloyn Manner do not feel he needs to spend hours and hours tonight in emergency room with this very atypical pain. At the same time patient is not had any type of stress imaging the best I can tell since 2014. Encouraged him to get back with his cardiologist for  consideration of this WSL

## 2015-12-18 ENCOUNTER — Encounter: Payer: Self-pay | Admitting: Family Medicine

## 2015-12-18 ENCOUNTER — Telehealth: Payer: Self-pay | Admitting: Cardiovascular Disease

## 2015-12-18 NOTE — Telephone Encounter (Signed)
SPOKE TO WIFE SHE STATES PATIENT HAS BEEN HAVING LEFT SHOULDER PAIN FOR MORE THAN A WEEK PATIENT IS CONCERNED BECAUSE 9 YEARS AGO -RIGHT SHOULDER HURT THE SAME WAY - PATIENT END UP HAVING 9 STENTS PLACED. PATIENT WENT TO PRIMARY ON Friday 12/15/15- SUGGEST TO SEE CARDIOLOGIST /POSSIBLE STRESS TEST  RN INFORMED WIFE WILL BE ABLE TO SEE PATIENT ON 12/20/15 WITH AN EXTENDER. APPOINTMENT MADE FOR 10 AM AT [place] VERBALIZED UNDERSTANDING.

## 2015-12-18 NOTE — Telephone Encounter (Signed)
New message   Pt wife is calling for a order for a stress test per PCP

## 2015-12-19 LAB — CUP PACEART REMOTE DEVICE CHECK
Battery Voltage: 2.79 V
Brady Statistic AP VP Percent: 0 %
Brady Statistic AP VS Percent: 39 %
Brady Statistic AS VS Percent: 61 %
Implantable Lead Implant Date: 20130605
Implantable Lead Implant Date: 20130605
Implantable Lead Location: 753860
Lead Channel Impedance Value: 473 Ohm
Lead Channel Impedance Value: 691 Ohm
Lead Channel Setting Pacing Amplitude: 2 V
Lead Channel Setting Pacing Pulse Width: 0.4 ms
MDC IDC LEAD LOCATION: 753859
MDC IDC MSMT BATTERY IMPEDANCE: 207 Ohm
MDC IDC MSMT BATTERY REMAINING LONGEVITY: 144 mo
MDC IDC SESS DTM: 20170530100338
MDC IDC SET LEADCHNL RA PACING AMPLITUDE: 1.5 V
MDC IDC SET LEADCHNL RV SENSING SENSITIVITY: 2.8 mV
MDC IDC STAT BRADY AS VP PERCENT: 0 %

## 2015-12-19 NOTE — Progress Notes (Signed)
Cardiology Office Note   Date:  12/20/2015   ID:  Chad Martinez, DOB 03-Apr-1935, MRN [id number]  PCP:  Mickie Hillier, MD  Cardiologist:  Dr. Gwenlyn Found    Chief Complaint  Patient presents with  . Shoulder Pain    back of the shoulder. scale on 1-10 - 2 most of the time      History of Present Illness: Chad Martinez is a 81 y.o. male who presents for Lt shoulder pain and hx of rt shoulder pain that resulted in 6 bypass grafts. He saw his PCP and he was asked to call us to be seen.      He has a history of CAD, status post coronary artery bypass grafting x6, April 20, 2007, by Dr. Gilford Raid. His other problems include remote tobacco abuse and hyperlipidemia. He vacations 6 months of the year in [place], [place], near [place]. He had an episode of syncope with high-grade second-degree AV block requiring dual-chamber pacemaker implantation by Dr. Sanda Klein with a Medtronic Adapta device. Dr. Sallyanne Kuster last saw him in the office in June of last year. The patient has remote interrogation via [facility] and is scheduled to see Dr. Sallyanne Kuster back in the office this coming August . He did have a Myoview tress test performed in March 2014 which was nonischemic.  Today he tells me the shoulder began bothering him 1.5 weeks ago and is constant.  No associated symptoms.  His EKG with PCP yesterday had no changes.  Pain has not changed since then.   Has full range of motion.   Past Medical History  Diagnosis Date  . Hypertension   . Coronary artery disease     s/p CABG x 6  . Enlarged prostate   . Detached retina     Partial  . Reflux   . Colon polyps   . Elevated PSA   . Syncope     Permanent Pacemaker 12/11/2011 MDT Adapta  . Hyperlipidemia   . Pneumothorax 12/10/2011    right rib fx after fall from ladder    Past Surgical History  Procedure Laterality Date  . Bypass graft  10/08    LIMA to LAD,SVG to 2nd & 3rd obtuse marginal CX,SVG to PDA,1st posterolateral and 2nd  posterolateral RCA  . Coronary artery bypass graft  04/19/2012  . Eye surgery  2004  . Skin graft  2012  . Hemorroidectomy    . Permanent pacemaker insertion  12/11/2011    MDT Adapta  . US echocardiography  10/14/2007    mild mitral annular ca+,EF 55%,AOV mildly sclerotic  . Nm myoview ltd  11/05/2010    no ischemia  . Colonoscopy N/A 01/25/2013    Procedure: COLONOSCOPY;  Surgeon: Daneil Dolin, MD;  [facility];  Service: Endoscopy;  Laterality: N/A;  8:30 Am  . Permanent pacemaker insertion N/A 12/11/2011    Procedure: PERMANENT PACEMAKER INSERTION;  Surgeon: Sanda Klein, MD;  Location: [facility];  Service: Cardiovascular;  Laterality: N/A;     Current Outpatient Prescriptions  Medication Sig Dispense Refill  . aspirin EC 81 MG tablet Take 81 mg by mouth daily.    . metoprolol tartrate (LOPRESSOR) 25 MG tablet Take 1 tablet (25 mg total) by mouth 2 (two) times daily. 180 tablet 3  . nitroGLYCERIN (NITROSTAT) 0.4 MG SL tablet Place 1 tablet (0.4 mg total) under the tongue every 5 (five) minutes as needed for chest pain. 25 tablet 2  . simvastatin (ZOCOR)  80 MG tablet Take 1 tablet (80 mg total) by mouth at bedtime. 90 tablet 1   No current facility-administered medications for this visit.    Allergies:   Review of patient's allergies indicates no known allergies.    Social History:  The patient  reports that he quit smoking about 41 years ago. He has never used smokeless tobacco. He reports that he does not drink alcohol or use illicit drugs.   Family History:  The patient's family history includes Lung cancer in his brother; Multiple sclerosis in his father; Pneumonia in his mother.    ROS:  General:no colds or fevers, no weight changes Skin:no rashes or ulcers HEENT:no blurred vision, no congestion CV:see HPI PUL:see HPI GI:no diarrhea constipation or melena, no indigestion GU:no hematuria, no dysuria MS:no joint pain, no claudication Neuro:no syncope, no  lightheadedness Endo:no diabetes, no thyroid disease  Wt Readings from Last 3 Encounters:  12/20/15 196 lb (88.905 kg)  12/15/15 193 lb (87.544 kg)  11/14/15 193 lb (87.544 kg)     PHYSICAL EXAM: VS:  BP 146/70 mmHg  Pulse 73  Ht 5\' 7"  (1.702 m)  Wt 196 lb (88.905 kg)  BMI 30.69 kg/m2  SpO2 96% , BMI Body mass index is 30.69 kg/(m^2). General:Pleasant affect, NAD Skin:Warm and dry, brisk capillary refill HEENT:normocephalic, sclera clear, mucus membranes moist Neck:supple, no JVD, no bruits  Heart:S1S2 RRR without murmur, gallup, rub or click, no chest wall pain to palpation Lungs:clear without rales, rhonchi, or wheezes [id number], non tender, + BS, do not palpate liver spleen or masses Ext:no lower ext edema, 2+ pedal pulses, 2+ radial pulses, FROM of Lt arm no pain to palpation.  Neuro:alert and oriented X 3, MAE, follows commands, + facial symmetry    EKG:  EKG is NOT ordered today. The ekg done yesterday was reviewed no acute changes.      Recent Labs: 10/05/2015: ALT 23; BUN 20; Creatinine, Ser 1.10; Potassium 4.2; Sodium 144    Lipid Panel    Component Value Date/Time   CHOL 94* 10/05/2015 0818   CHOL 95 01/31/2014 0714   TRIG 118 10/05/2015 0818   HDL 28* 10/05/2015 0818   HDL 34* 01/31/2014 0714   CHOLHDL 3.4 10/05/2015 0818   CHOLHDL 2.8 01/31/2014 0714   VLDL 24 01/31/2014 0714   LDLCALC 42 10/05/2015 0818   LDLCALC 37 01/31/2014 0714       Other studies Reviewed: Additional studies/ records that were reviewed today include: Echo, nuc study.   ASSESSMENT AND PLAN:  Lt shoulder pain, possible anginal equivalent.  Will do exercise myoview follow up post - will also review with Dr. Gwenlyn Found if +.     HTN (hypertension) History of hypertension with blood pressure measured at 146/70. He is on metoprolol. Continue current meds at current dosing Up some today will check with EXT.   Hyperlipemia History of hyperlipidemia on statin therapy with recent lipid  profile performed 10/05/15 revealing LDL 42 and HDL 28  Pacemaker History of permanent transvenous pacemaker placed for high-grade second-degree AV block 12/11/11 followed in the office by Dr. Sallyanne Kuster.   CAD (coronary artery disease) s/p CABG 2008 (LIMA to LAD, sequential SVG to OM2 and OM3, sequential SVG to PDA, PLV 1 and PLV2) history of CAD status post coronary artery bypass grafting X6 04/20/07 by Dr. Cyndia Bent . His most recent Myoview performed March 2014 was completely normalwill repeat - see above.   Current medicines are reviewed with the patient today.  The patient  Has no concerns regarding medicines.  The following changes have been made:  See above Labs/ tests ordered today include:see above  Disposition:   FU:  see above  Signed, Cecilie Kicks, NP  12/20/2015 1:21 PM    [facility] [place], [place], [place], [place] Phone: [phone]; Fax: [phone]

## 2015-12-20 ENCOUNTER — Encounter: Payer: Self-pay | Admitting: Cardiology

## 2015-12-20 ENCOUNTER — Ambulatory Visit (INDEPENDENT_AMBULATORY_CARE_PROVIDER_SITE_OTHER): Payer: Medicare Other | Admitting: Cardiology

## 2015-12-20 ENCOUNTER — Telehealth (HOSPITAL_COMMUNITY): Payer: Self-pay | Admitting: *Deleted

## 2015-12-20 VITALS — BP 146/70 | HR 73 | Ht 67.0 in | Wt 196.0 lb

## 2015-12-20 DIAGNOSIS — I10 Essential (primary) hypertension: Secondary | ICD-10-CM

## 2015-12-20 DIAGNOSIS — M25512 Pain in left shoulder: Secondary | ICD-10-CM

## 2015-12-20 DIAGNOSIS — I25709 Atherosclerosis of coronary artery bypass graft(s), unspecified, with unspecified angina pectoris: Secondary | ICD-10-CM | POA: Diagnosis not present

## 2015-12-20 DIAGNOSIS — I2510 Atherosclerotic heart disease of native coronary artery without angina pectoris: Secondary | ICD-10-CM

## 2015-12-20 NOTE — Patient Instructions (Signed)
Medication Instructions:  Your physician recommends that you continue on your current medications as directed. Please refer to the Current Medication list given to you today.   Labwork: NONE ORDERED  Testing/Procedures: Your physician has requested that you have en exercise stress myoview. For further information please visit [email or web address]. Please follow instruction sheet, as given.   Follow-Up: Your physician recommends that you schedule a follow-up appointment in: [place] DR. Gwenlyn Found OR AN EXTENDER AT THE [facility]   Any Other Special Instructions Will Be Listed Below (If Applicable).     If you need a refill on your cardiac medications before your next appointment, please call your pharmacy.

## 2015-12-20 NOTE — Telephone Encounter (Signed)
Patient given detailed instructions per Myocardial Perfusion Study Information Sheet for the test on 12/21/15 at 7:15. Patient notified to arrive 15 minutes early and that it is imperative to arrive on time for appointment to keep from having the test rescheduled.  If you need to cancel or reschedule your appointment, please call the office within 24 hours of your appointment. Failure to do so may result in a cancellation of your appointment, and a $50 no show fee. Patient verbalized understanding.Chad Martinez

## 2015-12-21 ENCOUNTER — Ambulatory Visit (HOSPITAL_COMMUNITY): Payer: Medicare Other | Attending: Cardiovascular Disease

## 2015-12-21 DIAGNOSIS — M25512 Pain in left shoulder: Secondary | ICD-10-CM | POA: Insufficient documentation

## 2015-12-21 DIAGNOSIS — I25709 Atherosclerosis of coronary artery bypass graft(s), unspecified, with unspecified angina pectoris: Secondary | ICD-10-CM

## 2015-12-21 DIAGNOSIS — Z8249 Family history of ischemic heart disease and other diseases of the circulatory system: Secondary | ICD-10-CM | POA: Insufficient documentation

## 2015-12-21 DIAGNOSIS — I10 Essential (primary) hypertension: Secondary | ICD-10-CM | POA: Insufficient documentation

## 2015-12-21 LAB — MYOCARDIAL PERFUSION IMAGING
CHL CUP NUCLEAR SDS: 1
CHL CUP NUCLEAR SRS: 4
CHL CUP NUCLEAR SSS: 5
LHR: 0.29
LV dias vol: 72 mL (ref 62–150)
LV sys vol: 29 mL
Peak HR: 96 {beats}/min
Rest HR: 76 {beats}/min
TID: 1.1

## 2015-12-21 MED ORDER — TECHNETIUM TC 99M TETROFOSMIN IV KIT
32.8000 | PACK | Freq: Once | INTRAVENOUS | Status: AC | PRN
  Administered 2015-12-21: 32.8 via INTRAVENOUS
  Filled 2015-12-21: qty 33

## 2015-12-21 MED ORDER — REGADENOSON 0.4 MG/5ML IV SOLN
0.4000 mg | Freq: Once | INTRAVENOUS | Status: AC
  Administered 2015-12-21: 0.4 mg via INTRAVENOUS

## 2015-12-21 MED ORDER — TECHNETIUM TC 99M TETROFOSMIN IV KIT
10.4000 | PACK | Freq: Once | INTRAVENOUS | Status: AC | PRN
  Administered 2015-12-21: 10 via INTRAVENOUS
  Filled 2015-12-21: qty 10

## 2015-12-26 ENCOUNTER — Encounter: Payer: Self-pay | Admitting: Cardiology

## 2016-01-10 ENCOUNTER — Encounter: Payer: Self-pay | Admitting: Cardiology

## 2016-01-10 ENCOUNTER — Ambulatory Visit (INDEPENDENT_AMBULATORY_CARE_PROVIDER_SITE_OTHER): Payer: Medicare Other | Admitting: Cardiology

## 2016-01-10 VITALS — BP 138/82 | HR 84 | Ht 67.0 in | Wt 195.2 lb

## 2016-01-10 DIAGNOSIS — Z951 Presence of aortocoronary bypass graft: Secondary | ICD-10-CM | POA: Diagnosis not present

## 2016-01-10 DIAGNOSIS — E785 Hyperlipidemia, unspecified: Secondary | ICD-10-CM

## 2016-01-10 DIAGNOSIS — I2510 Atherosclerotic heart disease of native coronary artery without angina pectoris: Secondary | ICD-10-CM

## 2016-01-10 DIAGNOSIS — Z950 Presence of cardiac pacemaker: Secondary | ICD-10-CM

## 2016-01-10 DIAGNOSIS — M25512 Pain in left shoulder: Secondary | ICD-10-CM | POA: Diagnosis not present

## 2016-01-10 DIAGNOSIS — I10 Essential (primary) hypertension: Secondary | ICD-10-CM

## 2016-01-10 NOTE — Assessment & Plan Note (Signed)
Controlled.  

## 2016-01-10 NOTE — Assessment & Plan Note (Signed)
Medtronic Adapta ADD RL1, serial number [id number], implanted 12/11/2011 for high-grade second-degree atrioventricular block

## 2016-01-10 NOTE — Progress Notes (Signed)
    01/10/2016 Chad Martinez   09/28/1934  [id number]  Primary Physician Annye Asa, MD Primary Cardiologist: Dr Gwenlyn Found Dr Croitoru  HPI:  81 y/o male followed by Dr Sallyanne Kuster for his pacemaker and Dr Gwenlyn Found for his CAD. The pt had CABG x 6 in 2008. He presented with Lt shoulder pain. He recently saw [facility] with similar complaints and he was set up for a Myoview. This was done 12/21/15 and showed no ischemia, normal LVF. He is in the office today for follow up. He denies any exertional symptoms. He walks a hour a day on the treadmill.    Current Outpatient Prescriptions  Medication Sig Dispense Refill  . aspirin EC 81 MG tablet Take 81 mg by mouth daily.    . metoprolol tartrate (LOPRESSOR) 25 MG tablet Take 1 tablet (25 mg total) by mouth 2 (two) times daily. 180 tablet 3  . nitroGLYCERIN (NITROSTAT) 0.4 MG SL tablet Place 0.4 mg under the tongue every 5 (five) minutes as needed for chest pain (x 3 doses).    . simvastatin (ZOCOR) 80 MG tablet Take 1 tablet (80 mg total) by mouth at bedtime. 90 tablet 1   No current facility-administered medications for this visit.    No Known Allergies  Social History   Social History  . Marital Status: Married    Spouse Name: N/A  . Number of Children: N/A  . Years of Education: N/A   Occupational History  . Not on file.   Social History Main Topics  . Smoking status: Former Smoker -- 3.00 packs/day for 20 years    Quit date: 12/19/1974  . Smokeless tobacco: Never Used  . Alcohol Use: No  . Drug Use: No  . Sexual Activity: Not on file   Other Topics Concern  . Not on file   Social History Narrative     Review of Systems: General: negative for chills, fever, night sweats or weight changes.  Cardiovascular: negative for chest pain, dyspnea on exertion, edema, orthopnea, palpitations, paroxysmal nocturnal dyspnea or shortness of breath Dermatological: negative for rash Respiratory: negative for cough or wheezing Urologic:  negative for hematuria Abdominal: negative for nausea, vomiting, diarrhea, bright red blood per rectum, melena, or hematemesis Neurologic: negative for visual changes, syncope, or dizziness All other systems reviewed and are otherwise negative except as noted above.    Blood pressure 138/82, pulse 84, height 5\' 7"  (1.702 m), weight 195 lb 3.2 oz (88.542 kg), SpO2 98 %.  General appearance: alert, cooperative, no distress and mildly obese Lungs: clear to auscultation bilaterally Heart: regular rate and rhythm Skin: Skin color, texture, turgor normal. No rashes or lesions Neurologic: Grossly normal    ASSESSMENT AND PLAN:   Shoulder pain, left Similar to his pre CABG symptoms  Hx of CABG x 6 2008 LIMA to LAD,SVG to 2nd & 3rd obtuse marginal CX,SVG to PDA,1st posterolateral and 2nd posterolateral RCA Myoview low risk- no ischemia, normal LVF 12/21/15  Pacemaker Medtronic Adapta ADD RL1, serial number [id number], implanted 12/11/2011 for high-grade second-degree atrioventricular block  Hyperlipemia LDL 42 March 2017  HTN (hypertension) Controlled    PLAN  Reviewed Myoview results with pt and his wife- same Rx. Consider further evaluation if he has exertional symptoms.  Chad Ransom PA-C 01/10/2016 10:52 AM

## 2016-01-10 NOTE — Assessment & Plan Note (Signed)
LIMA to LAD,SVG to 2nd & 3rd obtuse marginal CX,SVG to PDA,1st posterolateral and 2nd posterolateral RCA Myoview low risk- no ischemia, normal LVF 12/21/15

## 2016-01-10 NOTE — Assessment & Plan Note (Signed)
LDL 42 March 2017

## 2016-01-10 NOTE — Patient Instructions (Signed)
Your physician recommends that you schedule a follow-up appointment in: 6 Months with Dr Berry    

## 2016-01-10 NOTE — Assessment & Plan Note (Signed)
Similar to his pre CABG symptoms

## 2016-03-01 DIAGNOSIS — Z961 Presence of intraocular lens: Secondary | ICD-10-CM | POA: Diagnosis not present

## 2016-03-01 DIAGNOSIS — H338 Other retinal detachments: Secondary | ICD-10-CM | POA: Diagnosis not present

## 2016-03-05 ENCOUNTER — Ambulatory Visit (INDEPENDENT_AMBULATORY_CARE_PROVIDER_SITE_OTHER): Payer: Medicare Other | Admitting: Cardiovascular Disease

## 2016-03-05 ENCOUNTER — Telehealth: Payer: Self-pay | Admitting: Family Medicine

## 2016-03-05 ENCOUNTER — Encounter: Payer: Self-pay | Admitting: Cardiovascular Disease

## 2016-03-05 ENCOUNTER — Encounter: Payer: Self-pay | Admitting: Family Medicine

## 2016-03-05 VITALS — BP 178/89 | HR 73 | Ht 67.0 in | Wt 193.8 lb

## 2016-03-05 DIAGNOSIS — I441 Atrioventricular block, second degree: Secondary | ICD-10-CM | POA: Diagnosis not present

## 2016-03-05 DIAGNOSIS — E785 Hyperlipidemia, unspecified: Secondary | ICD-10-CM

## 2016-03-05 DIAGNOSIS — I10 Essential (primary) hypertension: Secondary | ICD-10-CM

## 2016-03-05 DIAGNOSIS — Z950 Presence of cardiac pacemaker: Secondary | ICD-10-CM | POA: Diagnosis not present

## 2016-03-05 DIAGNOSIS — I2510 Atherosclerotic heart disease of native coronary artery without angina pectoris: Secondary | ICD-10-CM

## 2016-03-05 DIAGNOSIS — R001 Bradycardia, unspecified: Secondary | ICD-10-CM | POA: Diagnosis not present

## 2016-03-05 LAB — CUP PACEART INCLINIC DEVICE CHECK
Date Time Interrogation Session: 20170829163831
Implantable Lead Implant Date: 20130605
Implantable Lead Location: 753859
Implantable Lead Model: 5076
Implantable Lead Model: 5076
MDC IDC LEAD IMPLANT DT: 20130605
MDC IDC LEAD LOCATION: 753860

## 2016-03-05 MED ORDER — METOPROLOL TARTRATE 25 MG PO TABS: 25.0000 mg | ORAL_TABLET | Freq: Two times a day (BID) | ORAL | 3 refills | Status: DC

## 2016-03-05 MED ORDER — SIMVASTATIN 80 MG PO TABS
80.0000 mg | ORAL_TABLET | Freq: Every day | ORAL | 3 refills | Status: DC
Start: 2016-03-05 — End: 2017-03-12

## 2016-03-05 NOTE — Telephone Encounter (Signed)
ERROR

## 2016-03-05 NOTE — Progress Notes (Signed)
Cardiology Office Note    Date:  03/06/2016   ID:  Chad Martinez, DOB November 04, 1934, MRN [id number]  PCP:  Annye Asa, MD  Cardiologist:  Quay Burow, M.D.; Sanda Klein, MD   Chief Complaint  Patient presents with  . Follow-up    History of Present Illness:  Chad Martinez is a 81 y.o. 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 He sees Dr. Gwenlyn Found for coronary artery disease with history of 6 vessel bypass surgery in 2008. He has normal left ventricular systolic function and had a normal nuclear study in 2017. He has treated hypertension and hyperlipidemia. He is preparing for his yearly departure to [place] where he spends the winter months.  He feels well and denies any problems with exertional angina or dyspnea. He saw Kerin Ransom for atypical chest pain several months ago and had a normal nuclear perfusion study in July. Denies palpitations, syncope, focal neurological deficits, leg edema, claudication, dizziness or lightheadedness.  Interrogation of his pacemaker shows normal device function and anticipated generator longevity of 12.5 years. There is 39% atrial pacing and virtually no ventricular pacing. He has had occasional episodes of paroxysmal atrial tachycardia, most of them lasting a few seconds one of them lasting for about a minute. Heart rate histogram distribution is favorable.  Past Medical History:  Diagnosis Date  . Colon polyps   . Coronary artery disease    s/p CABG x 6  . Detached retina    Partial  . Elevated PSA   . Enlarged prostate   . Hyperlipidemia   . Hypertension   . Pneumothorax 12/10/2011   right rib fx after fall from ladder  . Reflux   . Syncope    Permanent Pacemaker 12/11/2011 MDT Adapta    Past Surgical History:  Procedure Laterality Date  . BYPASS GRAFT  10/08   LIMA to LAD,SVG to 2nd & 3rd obtuse marginal CX,SVG to PDA,1st posterolateral and 2nd  posterolateral RCA  . COLONOSCOPY N/A 01/25/2013   Procedure: COLONOSCOPY;  Surgeon: Daneil Dolin, MD;  [facility];  Service: Endoscopy;  Laterality: N/A;  8:30 Am  . CORONARY ARTERY BYPASS GRAFT  04/19/2012  . EYE SURGERY  2004  . HEMORROIDECTOMY    . NM MYOVIEW LTD  11/05/2010   no ischemia  . PERMANENT PACEMAKER INSERTION  12/11/2011   MDT Adapta  . PERMANENT PACEMAKER INSERTION N/A 12/11/2011   Procedure: PERMANENT PACEMAKER INSERTION;  Surgeon: Sanda Klein, MD;  Location: [facility];  Service: Cardiovascular;  Laterality: N/A;  . SKIN GRAFT  2012  . US ECHOCARDIOGRAPHY  10/14/2007   mild mitral annular ca+,EF 55%,AOV mildly sclerotic    Current Medications: Outpatient Medications Prior to Visit  Medication Sig Dispense Refill  . aspirin EC 81 MG tablet Take 81 mg by mouth daily.    . nitroGLYCERIN (NITROSTAT) 0.4 MG SL tablet Place 0.4 mg under the tongue every 5 (five) minutes as needed for chest pain (x 3 doses).    . metoprolol tartrate (LOPRESSOR) 25 MG tablet Take 1 tablet (25 mg total) by mouth 2 (two) times daily. 180 tablet 3  . simvastatin (ZOCOR) 80 MG tablet Take 1 tablet (80 mg total) by mouth at bedtime. 90 tablet 1   No facility-administered medications prior to visit.      Allergies:   Review of patient's allergies indicates no known allergies.   Social History   Social History  .  Marital status: Married    Spouse name: N/A  . Number of children: N/A  . Years of education: N/A   Social History Main Topics  . Smoking status: Former Smoker    Packs/day: 3.00    Years: 20.00    Quit date: 12/19/1974  . Smokeless tobacco: Never Used  . Alcohol use No  . Drug use: No  . Sexual activity: Not Asked   Other Topics Concern  . None   Social History Narrative  . None     Family History:  The patient's family history includes Lung cancer in his brother; Multiple sclerosis in his father; Pneumonia in his mother.   ROS:   Please see the  history of present illness.    ROS All other systems reviewed and are negative.   PHYSICAL EXAM:   VS:  BP (!) 178/89   Pulse 73   Ht 5\' 7"  (1.702 m)   Wt 193 lb 12.8 oz (87.9 kg)   BMI 30.35 kg/m    GEN: Well nourished, well developed, in no acute distress  HEENT: normal  Neck: no JVD, carotid bruits, or masses Cardiac: RRR; no murmurs, rubs, or gallops,no edema , healthy left subclavian pacemaker site Respiratory:  clear to auscultation bilaterally, normal work of breathing GI: soft, nontender, nondistended, + BS MS: no deformity or atrophy  Skin: warm and dry, no rash Neuro:  Alert and Oriented x 3, Strength and sensation are intact Psych: euthymic mood, full affect  Wt Readings from Last 3 Encounters:  03/05/16 193 lb 12.8 oz (87.9 kg)  01/10/16 195 lb 3.2 oz (88.5 kg)  12/20/15 196 lb (88.9 kg)      Studies/Labs Reviewed:   EKG:  EKG is not ordered today.    Recent Labs: 10/05/2015: ALT 23; BUN 20; Creatinine, Ser 1.10; Potassium 4.2; Sodium 144   Lipid Panel    Component Value Date/Time   CHOL 94 (L) 10/05/2015 0818   TRIG 118 10/05/2015 0818   HDL 28 (L) 10/05/2015 0818   CHOLHDL 3.4 10/05/2015 0818   CHOLHDL 2.8 01/31/2014 0714   VLDL 24 01/31/2014 0714   LDLCALC 42 10/05/2015 0818     ASSESSMENT:    1. Mobitz type 2 second degree AV block   2. Pacemaker   3. Coronary artery disease involving native coronary artery of native heart without angina pectoris   4. Essential hypertension   5. Hyperlipemia      PLAN:  In order of problems listed above:  1. Second degree AV block: Seems to be an infrequent occurrence. Rarely needs ventricular pacing 2. PPM: Normal device function 3. CAD s/p CABG: Asymptomatic, very recent normal nuclear stress test 4. HTN: Blood pressure is elevated today but this is decidedly unusual for him. Just last month he saw Kerin Ransom and his blood pressure was normal. No changes are made to his medications today. Asked him to  keep an eye on his blood pressure with a home monitor. Call us if it is persistently elevated. 5. HLP: Lipid profile on statin    Medication Adjustments/Labs and Tests Ordered: Current medicines are reviewed at length with the patient today.  Concerns regarding medicines are outlined above.  Medication changes, Labs and Tests ordered today are listed in the Patient Instructions below. Patient Instructions  Dr Sallyanne Kuster recommends that you continue on your current medications as directed. Please refer to the Current Medication list given to you today.  Remote monitoring is used to monitor your Pacemaker of  ICD from home. This monitoring reduces the number of office visits required to check your device to one time per year. It allows [place] to keep an eye on the functioning of your device to ensure it is working properly. You are scheduled for a device check from home on Tuesday, November 28th, 2017. You may send your transmission at any time that day. If you have a wireless device, the transmission will be sent automatically. After your physician reviews your transmission, you will receive a postcard with your next transmission date.  Dr Sallyanne Kuster recommends that you schedule a follow-up appointment in 12 months with a pacemaker check. You will receive a reminder letter in the mail two months in advance. If you don't receive a letter, please call our office to schedule the follow-up appointment.  If you need a refill on your cardiac medications before your next appointment, please call your pharmacy.    Signed, Sanda Klein, MD  03/06/2016 6:42 PM    [facility] [place], [place], [place]  [address] Phone: [phone]; Fax: [phone]

## 2016-03-05 NOTE — Patient Instructions (Signed)
Dr Sallyanne Kuster recommends that you continue on your current medications as directed. Please refer to the Current Medication list given to you today.  Remote monitoring is used to monitor your Pacemaker of ICD from home. This monitoring reduces the number of office visits required to check your device to one time per year. It allows [place] to keep an eye on the functioning of your device to ensure it is working properly. You are scheduled for a device check from home on Tuesday, November 28th, 2017. You may send your transmission at any time that day. If you have a wireless device, the transmission will be sent automatically. After your physician reviews your transmission, you will receive a postcard with your next transmission date.  Dr Sallyanne Kuster recommends that you schedule a follow-up appointment in 12 months with a pacemaker check. You will receive a reminder letter in the mail two months in advance. If you don't receive a letter, please call our office to schedule the follow-up appointment.  If you need a refill on your cardiac medications before your next appointment, please call your pharmacy.

## 2016-03-05 NOTE — Telephone Encounter (Signed)
Does patient need order for blood work for appointment in September?  If so, he would like to have this done next week.

## 2016-03-06 DIAGNOSIS — I2510 Atherosclerotic heart disease of native coronary artery without angina pectoris: Secondary | ICD-10-CM | POA: Insufficient documentation

## 2016-03-06 NOTE — Telephone Encounter (Signed)
Lip liv microprotein (albumin to creat ratio

## 2016-03-06 NOTE — Telephone Encounter (Signed)
Blood work ordered in EPIC. Patient notified. 

## 2016-03-12 DIAGNOSIS — I10 Essential (primary) hypertension: Secondary | ICD-10-CM | POA: Diagnosis not present

## 2016-03-12 DIAGNOSIS — E785 Hyperlipidemia, unspecified: Secondary | ICD-10-CM | POA: Diagnosis not present

## 2016-03-13 LAB — HEPATIC FUNCTION PANEL
ALBUMIN: 4.4 g/dL (ref 3.5–4.8)
ALK PHOS: 57 IU/L (ref 39–117)
ALT: 28 IU/L (ref 0–44)
AST: 21 IU/L (ref 0–40)
Bilirubin Total: 1 mg/dL (ref 0.0–1.2)
Bilirubin, Direct: 0.25 mg/dL (ref 0.00–0.40)
Total Protein: 6.8 g/dL (ref 6.0–8.5)

## 2016-03-13 LAB — LIPID PANEL
CHOL/HDL RATIO: 3.5 ratio (ref 0.0–5.0)
Cholesterol, Total: 101 mg/dL (ref 100–199)
HDL: 29 mg/dL — AB (ref >39)
LDL CALC: 37 mg/dL (ref 0–99)
Triglycerides: 174 mg/dL — ABNORMAL HIGH (ref 0–149)
VLDL CHOLESTEROL CAL: 35 mg/dL (ref 5–40)

## 2016-03-13 LAB — MICROALBUMIN / CREATININE URINE RATIO
CREATININE, UR: 145.1 mg/dL
MICROALB/CREAT RATIO: 2.9 mg/g creat (ref 0.0–30.0)
MICROALBUM., U, RANDOM: 4.2 ug/mL

## 2016-03-25 ENCOUNTER — Encounter: Payer: Self-pay | Admitting: Family Medicine

## 2016-03-25 ENCOUNTER — Ambulatory Visit (INDEPENDENT_AMBULATORY_CARE_PROVIDER_SITE_OTHER): Payer: Medicare Other | Admitting: Family Medicine

## 2016-03-25 VITALS — BP 130/84 | Ht 67.0 in | Wt 194.0 lb

## 2016-03-25 DIAGNOSIS — N401 Enlarged prostate with lower urinary tract symptoms: Secondary | ICD-10-CM | POA: Diagnosis not present

## 2016-03-25 DIAGNOSIS — I10 Essential (primary) hypertension: Secondary | ICD-10-CM | POA: Diagnosis not present

## 2016-03-25 DIAGNOSIS — I2510 Atherosclerotic heart disease of native coronary artery without angina pectoris: Secondary | ICD-10-CM | POA: Diagnosis not present

## 2016-03-25 DIAGNOSIS — R7301 Impaired fasting glucose: Secondary | ICD-10-CM

## 2016-03-25 DIAGNOSIS — R202 Paresthesia of skin: Secondary | ICD-10-CM | POA: Diagnosis not present

## 2016-03-25 DIAGNOSIS — E785 Hyperlipidemia, unspecified: Secondary | ICD-10-CM

## 2016-03-25 DIAGNOSIS — J329 Chronic sinusitis, unspecified: Secondary | ICD-10-CM | POA: Diagnosis not present

## 2016-03-25 DIAGNOSIS — R972 Elevated prostate specific antigen [PSA]: Secondary | ICD-10-CM | POA: Diagnosis not present

## 2016-03-25 MED ORDER — AZITHROMYCIN 250 MG PO TABS: ORAL_TABLET | ORAL | 0 refills | Status: DC

## 2016-03-25 NOTE — Progress Notes (Signed)
   Subjective:    Patient ID: Chad Martinez, male    DOB: 18-Feb-1935, 81 y.o.   MRN: [id number]  Hypertension  This is a chronic problem. The current episode started more than 1 year ago. Risk factors for coronary artery disease include dyslipidemia. Treatments tried: metoprolol.   Patient reports cold sx for 4 days- just returned from cruise. Cough congested feels wheezing in the chest. At times. Has never had to use an inhaler.  Watching sugar closely. Now has prediabetes. Notes the numbness in his feet have improved considerably  Results for orders placed or performed in visit on 03/05/16  Lipid panel  Result Value Ref Range   Cholesterol, Total 101 100 - 199 mg/dL   Triglycerides 174 (H) 0 - 149 mg/dL   HDL 29 (L) >39 mg/dL   VLDL Cholesterol Cal 35 5 - 40 mg/dL   LDL Calculated 37 0 - 99 mg/dL   Chol/HDL Ratio 3.5 0.0 - 5.0 ratio units  Microalbumin / creatinine urine ratio  Result Value Ref Range   Creatinine, Urine 145.1 Not Estab. mg/dL   Microalbum.,U,Random 4.2 Not Estab. ug/mL   MICROALB/CREAT RATIO 2.9 0.0 - 30.0 mg/g creat  Hepatic function panel  Result Value Ref Range   Total Protein 6.8 6.0 - 8.5 g/dL   Albumin 4.4 3.5 - 4.8 g/dL   Bilirubin Total 1.0 0.0 - 1.2 mg/dL   Bilirubin, Direct 0.25 0.00 - 0.40 mg/dL   Alkaline Phosphatase 57 39 - 117 IU/L   AST 21 0 - 40 IU/L   ALT 28 0 - 44 IU/L   Pos productive, sore, gunky, rob prn cough no obv fever  Blood pressure medicine and blood pressure levels reviewed today with patient. Compliant with blood pressure medicine. States does not miss a dose. No obvious side effects. Blood pressure generally good when checked elsewhere. Watching salt intake.  Patient continues to take lipid medication regularly. No obvious side effects from it. Generally does not miss a dose. Prior blood work results are reviewed with patient. Patient continues to work on fat intake in diet   occa bp's  A bit high Review of Systems No  headache, no major weight loss or weight gain, no chest pain no back pain abdominal pain no change in bowel habits complete ROS otherwise negative     Objective:   Physical Exam  Alert vitals stable, NAD. Blood pressure good on repeat. HEENTModerate nasal congestion otherwise normal. Lungs bronchial cough but clear. Heart regular rate and rhythm. Feet sensation now completely intact. Positive changes of chronic venous stasis with stippling evident      Assessment & Plan:  Impression 1 hypertension good control discussed maintain same #2 rhinitis/bronchitis discussed at medication rationale discussed #3 hyperlipidemia good control to maintain same #4 prediabetes with paresthesias discuss also plan as per orders diet exercise discussed. Call back soon for flu shot follow-up in 6 months

## 2016-04-08 DIAGNOSIS — R972 Elevated prostate specific antigen [PSA]: Secondary | ICD-10-CM | POA: Diagnosis not present

## 2016-05-02 DIAGNOSIS — Z23 Encounter for immunization: Secondary | ICD-10-CM | POA: Diagnosis not present

## 2016-06-04 ENCOUNTER — Telehealth: Payer: Self-pay | Admitting: Cardiology

## 2016-06-04 ENCOUNTER — Encounter: Payer: Self-pay | Admitting: Cardiovascular Disease

## 2016-06-04 ENCOUNTER — Ambulatory Visit (INDEPENDENT_AMBULATORY_CARE_PROVIDER_SITE_OTHER): Payer: Medicare Other | Admitting: *Deleted

## 2016-06-04 DIAGNOSIS — I441 Atrioventricular block, second degree: Secondary | ICD-10-CM

## 2016-06-04 NOTE — Telephone Encounter (Signed)
Confirmed remote transmission w/ pt wife.   

## 2016-06-05 NOTE — Progress Notes (Signed)
Remote pacemaker transmission.   

## 2016-06-06 ENCOUNTER — Encounter: Payer: Self-pay | Admitting: Cardiology

## 2016-06-24 LAB — CUP PACEART REMOTE DEVICE CHECK
Battery Impedance: 207 Ohm
Battery Remaining Longevity: 143 mo
Battery Voltage: 2.79 V
Brady Statistic AP VP Percent: 0 %
Brady Statistic AP VS Percent: 38 %
Brady Statistic AS VP Percent: 0 %
Brady Statistic AS VS Percent: 62 %
Date Time Interrogation Session: 20171128163223
Implantable Lead Implant Date: 20130605
Implantable Lead Implant Date: 20130605
Implantable Lead Location: 753859
Implantable Lead Location: 753860
Implantable Lead Model: 5076
Implantable Lead Model: 5076
Implantable Pulse Generator Implant Date: 20130605
Lead Channel Impedance Value: 466 Ohm
Lead Channel Impedance Value: 726 Ohm
Lead Channel Pacing Threshold Amplitude: 0.5 V
Lead Channel Pacing Threshold Amplitude: 0.5 V
Lead Channel Pacing Threshold Pulse Width: 0.4 ms
Lead Channel Pacing Threshold Pulse Width: 0.4 ms
Lead Channel Setting Pacing Amplitude: 1.5 V
Lead Channel Setting Pacing Amplitude: 2 V
Lead Channel Setting Pacing Pulse Width: 0.4 ms
Lead Channel Setting Sensing Sensitivity: 4 mV

## 2016-09-03 ENCOUNTER — Ambulatory Visit (INDEPENDENT_AMBULATORY_CARE_PROVIDER_SITE_OTHER): Payer: Medicare Other | Admitting: *Deleted

## 2016-09-03 ENCOUNTER — Telehealth: Payer: Self-pay | Admitting: Cardiology

## 2016-09-03 DIAGNOSIS — I441 Atrioventricular block, second degree: Secondary | ICD-10-CM | POA: Diagnosis not present

## 2016-09-03 NOTE — Telephone Encounter (Signed)
LMOVM reminding pt to send remote transmission.   

## 2016-09-04 ENCOUNTER — Encounter: Payer: Self-pay | Admitting: Cardiology

## 2016-09-04 NOTE — Progress Notes (Signed)
Remote pacemaker transmission.   

## 2016-09-05 LAB — CUP PACEART REMOTE DEVICE CHECK
Battery Remaining Longevity: 144 mo
Battery Voltage: 2.79 V
Brady Statistic AP VP Percent: 0 %
Brady Statistic AP VS Percent: 36 %
Brady Statistic AS VP Percent: 0 %
Implantable Lead Implant Date: 20130605
Implantable Lead Location: 753859
Implantable Lead Model: 5076
Implantable Pulse Generator Implant Date: 20130605
Lead Channel Impedance Value: 466 Ohm
Lead Channel Pacing Threshold Amplitude: 0.375 V
Lead Channel Pacing Threshold Amplitude: 0.625 V
Lead Channel Pacing Threshold Pulse Width: 0.4 ms
Lead Channel Pacing Threshold Pulse Width: 0.4 ms
Lead Channel Setting Pacing Amplitude: 1.5 V
Lead Channel Setting Pacing Pulse Width: 0.4 ms
MDC IDC LEAD IMPLANT DT: 20130605
MDC IDC LEAD LOCATION: 753860
MDC IDC MSMT BATTERY IMPEDANCE: 207 Ohm
MDC IDC MSMT LEADCHNL RV IMPEDANCE VALUE: 726 Ohm
MDC IDC SESS DTM: 20180227111822
MDC IDC SET LEADCHNL RV PACING AMPLITUDE: 2 V
MDC IDC SET LEADCHNL RV SENSING SENSITIVITY: 2.8 mV
MDC IDC STAT BRADY AS VS PERCENT: 64 %

## 2016-10-02 ENCOUNTER — Telehealth: Payer: Self-pay | Admitting: Family Medicine

## 2016-10-02 DIAGNOSIS — I10 Essential (primary) hypertension: Secondary | ICD-10-CM

## 2016-10-02 DIAGNOSIS — E785 Hyperlipidemia, unspecified: Secondary | ICD-10-CM

## 2016-10-02 DIAGNOSIS — R7303 Prediabetes: Secondary | ICD-10-CM

## 2016-10-02 NOTE — Telephone Encounter (Signed)
Pt is requesting lab orders to be sent over for an upcoming appt. Last labs per epic were: hepatic,micoralbumin,and lipid on 03/12/16.

## 2016-10-02 NOTE — Telephone Encounter (Signed)
Blood work ordered in EPIC. Patient notified. 

## 2016-10-02 NOTE — Telephone Encounter (Signed)
Lip licv B5M m7

## 2016-10-08 DIAGNOSIS — E785 Hyperlipidemia, unspecified: Secondary | ICD-10-CM | POA: Diagnosis not present

## 2016-10-08 DIAGNOSIS — R7303 Prediabetes: Secondary | ICD-10-CM | POA: Diagnosis not present

## 2016-10-08 DIAGNOSIS — I10 Essential (primary) hypertension: Secondary | ICD-10-CM | POA: Diagnosis not present

## 2016-10-09 LAB — HEPATIC FUNCTION PANEL
ALBUMIN: 4.6 g/dL (ref 3.5–4.8)
ALK PHOS: 55 IU/L (ref 39–117)
ALT: 20 IU/L (ref 0–44)
AST: 21 IU/L (ref 0–40)
BILIRUBIN TOTAL: 0.9 mg/dL (ref 0.0–1.2)
BILIRUBIN, DIRECT: 0.24 mg/dL (ref 0.00–0.40)
Total Protein: 6.9 g/dL (ref 6.0–8.5)

## 2016-10-09 LAB — LIPID PANEL
CHOLESTEROL TOTAL: 96 mg/dL — AB (ref 100–199)
Chol/HDL Ratio: 2.6 ratio (ref 0.0–5.0)
HDL: 37 mg/dL — ABNORMAL LOW (ref >39)
LDL Calculated: 38 mg/dL (ref 0–99)
Triglycerides: 104 mg/dL (ref 0–149)
VLDL Cholesterol Cal: 21 mg/dL (ref 5–40)

## 2016-10-09 LAB — BASIC METABOLIC PANEL
BUN/Creatinine Ratio: 18 (ref 10–24)
BUN: 19 mg/dL (ref 8–27)
CO2: 26 mmol/L (ref 18–29)
Calcium: 9.1 mg/dL (ref 8.6–10.2)
Chloride: 104 mmol/L (ref 96–106)
Creatinine, Ser: 1.05 mg/dL (ref 0.76–1.27)
GFR, EST AFRICAN AMERICAN: 78 mL/min/{1.73_m2} (ref >59)
GFR, EST NON AFRICAN AMERICAN: 67 mL/min/{1.73_m2} (ref >59)
Glucose: 135 mg/dL — ABNORMAL HIGH (ref 65–99)
POTASSIUM: 4.8 mmol/L (ref 3.5–5.2)
SODIUM: 147 mmol/L — AB (ref 134–144)

## 2016-10-09 LAB — HEMOGLOBIN A1C
Est. average glucose Bld gHb Est-mCnc: 146 mg/dL
Hgb A1c MFr Bld: 6.7 % — ABNORMAL HIGH (ref 4.8–5.6)

## 2016-10-10 ENCOUNTER — Encounter: Payer: Self-pay | Admitting: Family Medicine

## 2016-10-10 ENCOUNTER — Ambulatory Visit (INDEPENDENT_AMBULATORY_CARE_PROVIDER_SITE_OTHER): Payer: Medicare Other | Admitting: Family Medicine

## 2016-10-10 VITALS — BP 122/82 | Ht 67.0 in | Wt 191.2 lb

## 2016-10-10 DIAGNOSIS — E7800 Pure hypercholesterolemia, unspecified: Secondary | ICD-10-CM

## 2016-10-10 DIAGNOSIS — E119 Type 2 diabetes mellitus without complications: Secondary | ICD-10-CM

## 2016-10-10 DIAGNOSIS — I10 Essential (primary) hypertension: Secondary | ICD-10-CM

## 2016-10-10 DIAGNOSIS — M7542 Impingement syndrome of left shoulder: Secondary | ICD-10-CM | POA: Insufficient documentation

## 2016-10-10 DIAGNOSIS — E1159 Type 2 diabetes mellitus with other circulatory complications: Secondary | ICD-10-CM | POA: Insufficient documentation

## 2016-10-10 HISTORY — DX: Impingement syndrome of left shoulder: M75.42

## 2016-10-10 NOTE — Progress Notes (Signed)
Subjective:    Patient ID: Chad Martinez, male    DOB: 05-23-1935, 82 y.o.   MRN: [id number]  Hypertension  This is a chronic problem. The current episode started more than 1 year ago. Risk factors for coronary artery disease include dyslipidemia and male gender. Treatments tried: metoprolol. There are no compliance problems.    Patient reports pan in left arm pain in left shoulder around the elbow. Going to see the cardiologist, pt did a stress test, and it was negative. Pain still there,,takes tylenol prn   Feels like in shoulder and elbow  Watching diet ,  Patient continues to take lipid medication regularly. No obvious side effects from it. Generally does not miss a dose. Prior blood work results are reviewed with patient. Patient continues to work on fat intake in diet  Blood pressure medicine and blood pressure levels reviewed today with patient. Compliant with blood pressure medicine. States does not miss a dose. No obvious side effects. Blood pressure generally good when checked elsewhere. Watching salt intake.    Results for orders placed or performed in visit on 10/02/16  Lipid panel  Result Value Ref Range   Cholesterol, Total 96 (L) 100 - 199 mg/dL   Triglycerides 104 0 - 149 mg/dL   HDL 37 (L) >39 mg/dL   VLDL Cholesterol Cal 21 5 - 40 mg/dL   LDL Calculated 38 0 - 99 mg/dL   Chol/HDL Ratio 2.6 0.0 - 5.0 ratio  Hepatic function panel  Result Value Ref Range   Total Protein 6.9 6.0 - 8.5 g/dL   Albumin 4.6 3.5 - 4.8 g/dL   Bilirubin Total 0.9 0.0 - 1.2 mg/dL   Bilirubin, Direct 0.24 0.00 - 0.40 mg/dL   Alkaline Phosphatase 55 39 - 117 IU/L   AST 21 0 - 40 IU/L   ALT 20 0 - 44 IU/L  Basic metabolic panel  Result Value Ref Range   Glucose 135 (H) 65 - 99 mg/dL   BUN 19 8 - 27 mg/dL   Creatinine, Ser 1.05 0.76 - 1.27 mg/dL   GFR calc non Af Amer 67 >59 mL/min/1.73   GFR calc Af Amer 78 >59 mL/min/1.73   BUN/Creatinine Ratio 18 10 - 24   Sodium 147 (H) 134 -  144 mmol/L   Potassium 4.8 3.5 - 5.2 mmol/L   Chloride 104 96 - 106 mmol/L   CO2 26 18 - 29 mmol/L   Calcium 9.1 8.6 - 10.2 mg/dL  Hemoglobin A1c  Result Value Ref Range   Hgb A1c MFr Bld 6.7 (H) 4.8 - 5.6 %   Est. average glucose Bld gHb Est-mCnc 146 mg/dL   BP s geerneally good when cked elsewehre'  Does situp each nigh, walking some, not as . Walking reg   Review of Systems No headache, no major weight loss or weight gain, no chest pain no back pain abdominal pain no change in bowel habits complete ROS otherwise negative     Objective:   Physical Exam  Alert and oriented, vitals reviewed and stable, NAD ENT-TM's and ext canals WNL bilat via otoscopic exam Soft palate, tonsils and post pharynx WNL via oropharyngeal exam Neck-symmetric, no masses; thyroid nonpalpable and nontender Pulmonary-no tachypnea or accessory muscle use; Clear without wheezes via auscultation Card--no abnrml murmurs, rhythm reg and rate WNL Carotid pulses symmetric, without bruits       Assessment & Plan:  Impression 1 hypertension good control discussed maintain same dose of meds discussed #2 hyperlipidemia  excellent control. Particularly with #3 need LDL below are right at goal #3 coronary artery disease clinically stable #4 left shoulder impingement discuss states overall improving wishes no mega-workup at this time #5 type 2 diabetes. New onset. Discussed. We been watching patient's prediabetes for number of years now planned diet discuss exercise discussed medications refilled. Recheck in 2 weeks for a week new diagnosis diabetes is

## 2016-10-10 NOTE — Patient Instructions (Signed)
Results for orders placed or performed in visit on 10/02/16  Lipid panel  Result Value Ref Range   Cholesterol, Total 96 (L) 100 - 199 mg/dL   Triglycerides 104 0 - 149 mg/dL   HDL 37 (L) >39 mg/dL   VLDL Cholesterol Cal 21 5 - 40 mg/dL   LDL Calculated 38 0 - 99 mg/dL   Chol/HDL Ratio 2.6 0.0 - 5.0 ratio  Hepatic function panel  Result Value Ref Range   Total Protein 6.9 6.0 - 8.5 g/dL   Albumin 4.6 3.5 - 4.8 g/dL   Bilirubin Total 0.9 0.0 - 1.2 mg/dL   Bilirubin, Direct 0.24 0.00 - 0.40 mg/dL   Alkaline Phosphatase 55 39 - 117 IU/L   AST 21 0 - 40 IU/L   ALT 20 0 - 44 IU/L  Basic metabolic panel  Result Value Ref Range   Glucose 135 (H) 65 - 99 mg/dL   BUN 19 8 - 27 mg/dL   Creatinine, Ser 1.05 0.76 - 1.27 mg/dL   GFR calc non Af Amer 67 >59 mL/min/1.73   GFR calc Af Amer 78 >59 mL/min/1.73   BUN/Creatinine Ratio 18 10 - 24   Sodium 147 (H) 134 - 144 mmol/L   Potassium 4.8 3.5 - 5.2 mmol/L   Chloride 104 96 - 106 mmol/L   CO2 26 18 - 29 mmol/L   Calcium 9.1 8.6 - 10.2 mg/dL  Hemoglobin A1c  Result Value Ref Range   Hgb A1c MFr Bld 6.7 (H) 4.8 - 5.6 %   Est. average glucose Bld gHb Est-mCnc 146 mg/dL

## 2016-10-28 ENCOUNTER — Ambulatory Visit (INDEPENDENT_AMBULATORY_CARE_PROVIDER_SITE_OTHER): Payer: Medicare Other | Admitting: Family Medicine

## 2016-10-28 ENCOUNTER — Encounter: Payer: Self-pay | Admitting: Family Medicine

## 2016-10-28 VITALS — BP 138/78 | Ht 67.0 in | Wt 188.0 lb

## 2016-10-28 DIAGNOSIS — E119 Type 2 diabetes mellitus without complications: Secondary | ICD-10-CM

## 2016-10-28 NOTE — Progress Notes (Signed)
   Subjective:    Patient ID: Chad Martinez, male    DOB: Oct 18, 1934, 82 y.o.   MRN: [id number]  Diabetes  He presents for his initial diabetic visit. He has type 2 diabetes mellitus. He is following a diabetic diet. Exercise: 5 -7 days a week. Eye exam is current.   A1C 6.7 on 10/08/16.   Blood pressure medicine and blood pressure levels reviewed today with patient. Compliant with blood pressure medicine. States does not miss a dose. No obvious side effects. Blood pressure generally good when checked elsewhere. Watching salt intake.   Patient claims compliance with diabetes medication. No obvious side effects. Reports no substantial low sugar spells. Most numbers are generally in good range when checked fasting. Generally does not miss a dose of medication. Watching diabetic diet closely  Glu this morn 140   diab e   Does not crave sugar, and watches it  Vaccinations important        Review of Systems No headache, no major weight loss or weight gain, no chest pain no back pain abdominal pain no change in bowel habits complete ROS otherwise negative     Objective:   Physical Exam  Alert and oriented, vitals reviewed and stable, NAD ENT-TM's and ext canals WNL bilat via otoscopic exam Soft palate, tonsils and post pharynx WNL via oropharyngeal exam Neck-symmetric, no masses; thyroid nonpalpable and nontender Pulmonary-no tachypnea or accessory muscle use; Clear without wheezes via auscultation Card--no abnrml murmurs, rhythm reg and rate WNL Carotid pulses symmetric, without bruits       Assessment & Plan:  Impression type 2 diabetes new onset. Discussed at great length. Long-term implications. Change in approach other chronic illnesses. Increased risk of heart disease stroke kidney failure amputation all discussed. Ways to avoid this in the future discussed. Need to pay more attention and vaccinations discussed. Glucometer prescribed. Many questions answered  Greater  than 50% of this 25 minute face to face visit was spent in counseling and discussion and coordination of care regarding the above diagnosis/diagnosies

## 2016-10-28 NOTE — Patient Instructions (Addendum)
Consider self bp monistoring, life source bp monitors, fifty bucks or so,  Most fasting sugars should fall between 70 and 130   Yearly eye visits important, Hx of detached retina

## 2016-11-11 DIAGNOSIS — N401 Enlarged prostate with lower urinary tract symptoms: Secondary | ICD-10-CM | POA: Diagnosis not present

## 2016-11-11 DIAGNOSIS — N138 Other obstructive and reflux uropathy: Secondary | ICD-10-CM | POA: Diagnosis not present

## 2016-11-11 DIAGNOSIS — E119 Type 2 diabetes mellitus without complications: Secondary | ICD-10-CM | POA: Diagnosis not present

## 2016-11-11 DIAGNOSIS — R972 Elevated prostate specific antigen [PSA]: Secondary | ICD-10-CM | POA: Diagnosis not present

## 2016-11-13 ENCOUNTER — Ambulatory Visit (INDEPENDENT_AMBULATORY_CARE_PROVIDER_SITE_OTHER): Payer: Medicare Other | Admitting: Cardiovascular Disease

## 2016-11-13 ENCOUNTER — Encounter: Payer: Self-pay | Admitting: Cardiovascular Disease

## 2016-11-13 VITALS — BP 162/84 | HR 77 | Ht 68.0 in | Wt 189.0 lb

## 2016-11-13 DIAGNOSIS — Z950 Presence of cardiac pacemaker: Secondary | ICD-10-CM

## 2016-11-13 DIAGNOSIS — I10 Essential (primary) hypertension: Secondary | ICD-10-CM | POA: Diagnosis not present

## 2016-11-13 DIAGNOSIS — E7800 Pure hypercholesterolemia, unspecified: Secondary | ICD-10-CM

## 2016-11-13 DIAGNOSIS — Z951 Presence of aortocoronary bypass graft: Secondary | ICD-10-CM | POA: Diagnosis not present

## 2016-11-13 NOTE — Patient Instructions (Signed)

## 2016-11-13 NOTE — Assessment & Plan Note (Signed)
History of hyperlipidemia on statin therapy with recent lipid profile performed 10/08/16 revealing an LDL of 38 and HDL of 37.

## 2016-11-13 NOTE — Assessment & Plan Note (Signed)
History of coronary artery bypass grafting X 6 04/20/07 by Dr. Arvid Right . He did have a Myoview stress test performed 12/21/15 which was low risk with normal ejection fraction. He denies chest pain but does have atypical positional left upper extremity discomfort.

## 2016-11-13 NOTE — Progress Notes (Signed)
11/13/2016 Chad Martinez   1934-10-01  [id number]  Primary Physician Mikey Kirschner, MD Primary Cardiologist: Lorretta Harp MD Chad Martinez  HPI:  The patient is a very pleasant 82 year old mildly overweight married Caucasian male, father of 86, who is accompanied by his wife today. I last saw him in the office 11/14/15. He has a history of CAD, status post coronary artery bypass grafting x6, April 20, 2007, by Dr. Gilford Raid. His other problems include remote tobacco abuse and hyperlipidemia. He is asymptomatic. He vacations 6 months of the year in [place], [place], near [place]. He had an episode of syncope with high-grade second-degree AV block requiring dual-chamber pacemaker implantation by Dr. Sanda Klein with a [employer] Adapta device. Dr. Sallyanne Kuster last saw him in the office in June of last year. The patient has remote interrogation via [facility] and is scheduled to see Dr. Sallyanne Kuster back in the office this coming August . He did have a Myoview tress test performed 12/20/68 which is low risk. Recent blood work performed by his PCP for/3/18 revealed an LDL of 38 and HDL of 37.   Current Outpatient Prescriptions  Medication Sig Dispense Refill  . aspirin EC 81 MG tablet Take 81 mg by mouth daily.    . metoprolol tartrate (LOPRESSOR) 25 MG tablet Take 1 tablet (25 mg total) by mouth 2 (two) times daily. 180 tablet 3  . nitroGLYCERIN (NITROSTAT) 0.4 MG SL tablet Place 0.4 mg under the tongue every 5 (five) minutes as needed for chest pain (x 3 doses).    . simvastatin (ZOCOR) 80 MG tablet Take 1 tablet (80 mg total) by mouth at bedtime. 90 tablet 3   No current facility-administered medications for this visit.     No Known Allergies  Social History   Social History  . Marital status: Married    Spouse name: N/A  . Number of children: N/A  . Years of education: N/A   Occupational History  . Not on file.   Social History Main Topics  . Smoking status:  Former Smoker    Packs/day: 3.00    Years: 20.00    Quit date: 12/19/1974  . Smokeless tobacco: Never Used  . Alcohol use No  . Drug use: No  . Sexual activity: Not on file   Other Topics Concern  . Not on file   Social History Narrative  . No narrative on file     Review of Systems: General: negative for chills, fever, night sweats or weight changes.  Cardiovascular: negative for chest pain, dyspnea on exertion, edema, orthopnea, palpitations, paroxysmal nocturnal dyspnea or shortness of breath Dermatological: negative for rash Respiratory: negative for cough or wheezing Urologic: negative for hematuria Abdominal: negative for nausea, vomiting, diarrhea, bright red blood per rectum, melena, or hematemesis Neurologic: negative for visual changes, syncope, or dizziness All other systems reviewed and are otherwise negative except as noted above.    Blood pressure (!) 162/84, pulse 77, height 5\' 8"  (1.727 m), weight 189 lb (85.7 kg).  General appearance: alert and no distress Neck: no adenopathy, no carotid bruit, no JVD, supple, symmetrical, trachea midline and thyroid not enlarged, symmetric, no tenderness/mass/nodules Lungs: clear to auscultation bilaterally Heart: regular rate and rhythm, S1, S2 normal, no murmur, click, rub or gallop Extremities: extremities normal, atraumatic, no cyanosis or edema  EKG actually paced rhythm at 77 with right bundle branch block. I personally reviewed this EKG  ASSESSMENT AND PLAN:   Hyperlipemia  History of hyperlipidemia on statin therapy with recent lipid profile performed 10/08/16 revealing an LDL of 38 and HDL of 37.  Pacemaker History of permanent transvenous pacemaker implantation by Dr. Sallyanne Kuster 12/11/11 which is checked quarterly by [facility] and followed annually.  Hx of CABG x 6 2008 History of coronary artery bypass grafting X 6 04/20/07 by Dr. Arvid Right . He did have a Myoview stress test performed 12/21/15 which was low risk  with normal ejection fraction. He denies chest pain but does have atypical positional left upper extremity discomfort.  HTN (hypertension) History of hypertension with blood pressure measured 130/76. On metoprolol. Continue current meds at current dosing      Lorretta Harp MD [facility], [facility] 11/13/2016 11:21 AM

## 2016-11-13 NOTE — Assessment & Plan Note (Signed)
History of permanent transvenous pacemaker implantation by Dr. Sallyanne Kuster 12/11/11 which is checked quarterly by [facility] and followed annually.

## 2016-11-13 NOTE — Assessment & Plan Note (Signed)
History of hypertension with blood pressure measured 130/76. On metoprolol. Continue current meds at current dosing

## 2016-12-04 ENCOUNTER — Ambulatory Visit (INDEPENDENT_AMBULATORY_CARE_PROVIDER_SITE_OTHER): Payer: Medicare Other | Admitting: *Deleted

## 2016-12-04 DIAGNOSIS — I441 Atrioventricular block, second degree: Secondary | ICD-10-CM | POA: Diagnosis not present

## 2016-12-06 LAB — CUP PACEART REMOTE DEVICE CHECK
Battery Impedance: 207 Ohm
Battery Remaining Longevity: 144 mo
Battery Voltage: 2.79 V
Brady Statistic AP VP Percent: 0 %
Brady Statistic AP VS Percent: 36 %
Brady Statistic AS VP Percent: 0 %
Brady Statistic AS VS Percent: 64 %
Date Time Interrogation Session: 20180530100017
Implantable Lead Implant Date: 20130605
Implantable Lead Implant Date: 20130605
Implantable Lead Location: 753859
Implantable Lead Location: 753860
Implantable Lead Model: 5076
Implantable Lead Model: 5076
Implantable Pulse Generator Implant Date: 20130605
Lead Channel Impedance Value: 466 Ohm
Lead Channel Impedance Value: 689 Ohm
Lead Channel Pacing Threshold Amplitude: 0.5 V
Lead Channel Pacing Threshold Amplitude: 0.75 V
Lead Channel Pacing Threshold Pulse Width: 0.4 ms
Lead Channel Pacing Threshold Pulse Width: 0.4 ms
Lead Channel Sensing Intrinsic Amplitude: 2.8 mV
Lead Channel Sensing Intrinsic Amplitude: 8 mV
Lead Channel Setting Pacing Amplitude: 1.5 V
Lead Channel Setting Pacing Amplitude: 2 V
Lead Channel Setting Pacing Pulse Width: 0.4 ms
Lead Channel Setting Sensing Sensitivity: 2.8 mV

## 2016-12-06 NOTE — Progress Notes (Signed)
Remote pacemaker transmission.   

## 2016-12-13 ENCOUNTER — Encounter: Payer: Self-pay | Admitting: Cardiology

## 2017-01-14 IMAGING — NM NM MISC PROCEDURE
6 series · 36 of 36 positions shown · non-contrast
Comparison: none

[Series 1: rest · 6.51mm/px · 6 of 64 frames shown]
[frame 6/64]
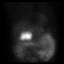
[frame 16/64]
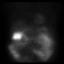
[frame 27/64]
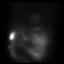
[frame 38/64]
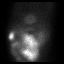
[frame 48/64]
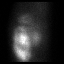
[frame 59/64]
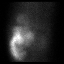

[Series 1: wbr_r-proj_st rest · 6.51mm/px · 6 of 64 frames shown]
[frame 6/64]
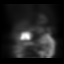
[frame 16/64]
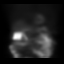
[frame 27/64]
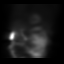
[frame 38/64]
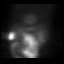
[frame 48/64]
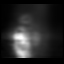
[frame 59/64]
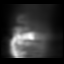

[Series 2: wbr_s-proj_st stress · 6.51mm/px · 6 of 512 frames shown (1 of 2)]
[frame 43/512]
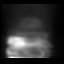
[frame 128/512]
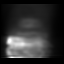
[frame 214/512]
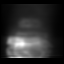
[frame 299/512]
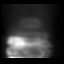
[frame 384/512]
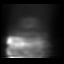
[frame 470/512]
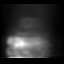

[Series 2: stress · 6.51mm/px · 6 of 64 frames shown (1 of 2)]
[frame 6/64]
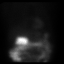
[frame 16/64]
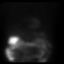
[frame 27/64]
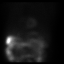
[frame 38/64]
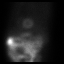
[frame 48/64]
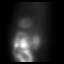
[frame 59/64]
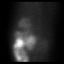

[Series 2: stress · 6.51mm/px · 6 of 512 frames shown (2 of 2)]
[frame 43/512]
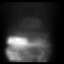
[frame 128/512]
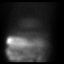
[frame 214/512]
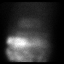
[frame 299/512]
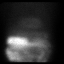
[frame 384/512]
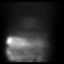
[frame 470/512]
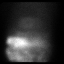

[Series 2: wbr_s-proj_st stress · 6.51mm/px · 6 of 64 frames shown (2 of 2)]
[frame 6/64]
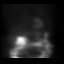
[frame 16/64]
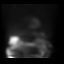
[frame 27/64]
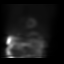
[frame 38/64]
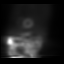
[frame 48/64]
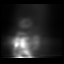
[frame 59/64]
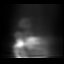

[36 of 36 positions shown; findings below may reference images not displayed]

Canned report from images found in remote index.

Refer to host system for actual result text.

## 2017-02-03 DIAGNOSIS — R399 Unspecified symptoms and signs involving the genitourinary system: Secondary | ICD-10-CM | POA: Diagnosis not present

## 2017-02-04 ENCOUNTER — Other Ambulatory Visit (HOSPITAL_COMMUNITY)
Admission: RE | Admit: 2017-02-04 | Discharge: 2017-02-04 | Disposition: A | Discharge status: Home / Self Care | Payer: Medicare Other | Source: Ambulatory Visit | Attending: Family Medicine | Admitting: Family Medicine

## 2017-02-04 ENCOUNTER — Observation Stay (HOSPITAL_COMMUNITY)
Admission: EM | Admit: 2017-02-04 | Discharge: 2017-02-06 | Disposition: A | Discharge status: Home / Self Care | Payer: Medicare Other | Attending: Internal Medicine | Admitting: Internal Medicine

## 2017-02-04 ENCOUNTER — Encounter: Payer: Self-pay | Admitting: Family Medicine

## 2017-02-04 ENCOUNTER — Ambulatory Visit (INDEPENDENT_AMBULATORY_CARE_PROVIDER_SITE_OTHER): Payer: Medicare Other | Admitting: Family Medicine

## 2017-02-04 ENCOUNTER — Encounter (HOSPITAL_COMMUNITY): Payer: Self-pay | Admitting: Emergency Medicine

## 2017-02-04 VITALS — BP 138/88 | Temp 99.8°F | Ht 68.0 in | Wt 194.0 lb

## 2017-02-04 DIAGNOSIS — R350 Frequency of micturition: Secondary | ICD-10-CM | POA: Insufficient documentation

## 2017-02-04 DIAGNOSIS — N179 Acute kidney failure, unspecified: Secondary | ICD-10-CM | POA: Diagnosis not present

## 2017-02-04 DIAGNOSIS — E1159 Type 2 diabetes mellitus with other circulatory complications: Secondary | ICD-10-CM

## 2017-02-04 DIAGNOSIS — I2510 Atherosclerotic heart disease of native coronary artery without angina pectoris: Secondary | ICD-10-CM | POA: Insufficient documentation

## 2017-02-04 DIAGNOSIS — E119 Type 2 diabetes mellitus without complications: Secondary | ICD-10-CM | POA: Insufficient documentation

## 2017-02-04 DIAGNOSIS — R972 Elevated prostate specific antigen [PSA]: Secondary | ICD-10-CM | POA: Diagnosis not present

## 2017-02-04 DIAGNOSIS — Z951 Presence of aortocoronary bypass graft: Secondary | ICD-10-CM | POA: Insufficient documentation

## 2017-02-04 DIAGNOSIS — Z950 Presence of cardiac pacemaker: Secondary | ICD-10-CM | POA: Insufficient documentation

## 2017-02-04 DIAGNOSIS — N401 Enlarged prostate with lower urinary tract symptoms: Secondary | ICD-10-CM | POA: Diagnosis not present

## 2017-02-04 DIAGNOSIS — Z79899 Other long term (current) drug therapy: Secondary | ICD-10-CM | POA: Diagnosis not present

## 2017-02-04 DIAGNOSIS — N3000 Acute cystitis without hematuria: Secondary | ICD-10-CM

## 2017-02-04 DIAGNOSIS — Z7982 Long term (current) use of aspirin: Secondary | ICD-10-CM | POA: Insufficient documentation

## 2017-02-04 DIAGNOSIS — R1030 Lower abdominal pain, unspecified: Secondary | ICD-10-CM | POA: Diagnosis present

## 2017-02-04 DIAGNOSIS — R338 Other retention of urine: Secondary | ICD-10-CM | POA: Insufficient documentation

## 2017-02-04 DIAGNOSIS — R339 Retention of urine, unspecified: Secondary | ICD-10-CM | POA: Diagnosis not present

## 2017-02-04 DIAGNOSIS — R6883 Chills (without fever): Secondary | ICD-10-CM | POA: Insufficient documentation

## 2017-02-04 DIAGNOSIS — I10 Essential (primary) hypertension: Secondary | ICD-10-CM | POA: Diagnosis not present

## 2017-02-04 LAB — CBC
HEMATOCRIT: 39.9 % (ref 39.0–52.0)
Hemoglobin: 13.8 g/dL (ref 13.0–17.0)
MCH: 30.4 pg (ref 26.0–34.0)
MCHC: 34.6 g/dL (ref 30.0–36.0)
MCV: 87.9 fL (ref 78.0–100.0)
PLATELETS: 114 10*3/uL — AB (ref 150–400)
RBC: 4.54 MIL/uL (ref 4.22–5.81)
RDW: 13 % (ref 11.5–15.5)
WBC: 9 10*3/uL (ref 4.0–10.5)

## 2017-02-04 LAB — URINALYSIS, ROUTINE W REFLEX MICROSCOPIC
BACTERIA UA: NONE SEEN
Bilirubin Urine: NEGATIVE
GLUCOSE, UA: NEGATIVE mg/dL
KETONES UR: NEGATIVE mg/dL
LEUKOCYTES UA: NEGATIVE
Nitrite: POSITIVE — AB
PROTEIN: NEGATIVE mg/dL
Specific Gravity, Urine: 1.009 (ref 1.005–1.030)
pH: 5 (ref 5.0–8.0)

## 2017-02-04 LAB — BASIC METABOLIC PANEL
ANION GAP: 8 (ref 5–15)
ANION GAP: 9 (ref 5–15)
BUN: 39 mg/dL — ABNORMAL HIGH (ref 6–20)
BUN: 39 mg/dL — AB (ref 6–20)
CALCIUM: 8.8 mg/dL — AB (ref 8.9–10.3)
CO2: 26 mmol/L (ref 22–32)
CO2: 22 mmol/L (ref 22–32)
CREATININE: 3.01 mg/dL — AB (ref 0.61–1.24)
Calcium: 8.3 mg/dL — ABNORMAL LOW (ref 8.9–10.3)
Chloride: 110 mmol/L (ref 101–111)
Chloride: 107 mmol/L (ref 101–111)
Creatinine, Ser: 2.99 mg/dL — ABNORMAL HIGH (ref 0.61–1.24)
GFR calc Af Amer: 21 mL/min — ABNORMAL LOW (ref >60)
GFR, EST AFRICAN AMERICAN: 21 mL/min — AB (ref >60)
GFR, EST NON AFRICAN AMERICAN: 18 mL/min — AB (ref >60)
GFR, EST NON AFRICAN AMERICAN: 18 mL/min — AB (ref >60)
GLUCOSE: 268 mg/dL — AB (ref 65–99)
Glucose, Bld: 168 mg/dL — ABNORMAL HIGH (ref 65–99)
POTASSIUM: 3.9 mmol/L (ref 3.5–5.1)
Potassium: 4.3 mmol/L (ref 3.5–5.1)
Sodium: 144 mmol/L (ref 135–145)
Sodium: 138 mmol/L (ref 135–145)

## 2017-02-04 LAB — CBC WITH DIFFERENTIAL/PLATELET
BASOS ABS: 0 10*3/uL (ref 0.0–0.1)
BASOS PCT: 0 %
EOS ABS: 0.1 10*3/uL (ref 0.0–0.7)
EOS PCT: 1 %
HEMATOCRIT: 42.1 % (ref 39.0–52.0)
Hemoglobin: 14.5 g/dL (ref 13.0–17.0)
Lymphocytes Relative: 9 %
Lymphs Abs: 0.9 10*3/uL (ref 0.7–4.0)
MCH: 30.5 pg (ref 26.0–34.0)
MCHC: 34.4 g/dL (ref 30.0–36.0)
MCV: 88.4 fL (ref 78.0–100.0)
MONO ABS: 1.3 10*3/uL — AB (ref 0.1–1.0)
MONOS PCT: 14 %
NEUTROS ABS: 7.3 10*3/uL (ref 1.7–7.7)
Neutrophils Relative %: 76 %
PLATELETS: 117 10*3/uL — AB (ref 150–400)
RBC: 4.76 MIL/uL (ref 4.22–5.81)
RDW: 12.9 % (ref 11.5–15.5)
WBC: 9.6 10*3/uL (ref 4.0–10.5)

## 2017-02-04 LAB — POCT URINALYSIS DIPSTICK
PH UA: 5 (ref 5.0–8.0)
SPEC GRAV UA: 1.01 (ref 1.010–1.025)

## 2017-02-04 MED ORDER — METOPROLOL TARTRATE 25 MG PO TABS
25.0000 mg | ORAL_TABLET | Freq: Two times a day (BID) | ORAL | Status: DC
  Administered 2017-02-05 – 2017-02-06 (×3): 25 mg via ORAL
  Filled 2017-02-04 (×3): qty 1

## 2017-02-04 MED ORDER — ONDANSETRON HCL 4 MG/2ML IJ SOLN: 4.0000 mg | Freq: Four times a day (QID) | INTRAMUSCULAR | Status: DC | PRN

## 2017-02-04 MED ORDER — TAMSULOSIN HCL 0.4 MG PO CAPS
0.4000 mg | ORAL_CAPSULE | Freq: Every day | ORAL | Status: DC
  Administered 2017-02-05 – 2017-02-06 (×2): 0.4 mg via ORAL
  Filled 2017-02-04 (×2): qty 1

## 2017-02-04 MED ORDER — DEXTROSE 5 % IV SOLN
1.0000 g | Freq: Once | INTRAVENOUS | Status: AC
  Administered 2017-02-04: 1 g via INTRAVENOUS
  Filled 2017-02-04: qty 10

## 2017-02-04 MED ORDER — CEFTRIAXONE SODIUM 1 G IJ SOLR
500.0000 mg | Freq: Once | INTRAMUSCULAR | Status: AC
  Administered 2017-02-04: 500 mg via INTRAMUSCULAR

## 2017-02-04 MED ORDER — ACETAMINOPHEN 650 MG RE SUPP: 650.0000 mg | Freq: Four times a day (QID) | RECTAL | Status: DC | PRN

## 2017-02-04 MED ORDER — ATORVASTATIN CALCIUM 40 MG PO TABS
40.0000 mg | ORAL_TABLET | Freq: Every day | ORAL | Status: DC
  Administered 2017-02-05: 40 mg via ORAL
  Filled 2017-02-04: qty 1

## 2017-02-04 MED ORDER — SODIUM CHLORIDE 0.9 % IV SOLN
INTRAVENOUS | Status: DC
  Administered 2017-02-04: 23:00:00 via INTRAVENOUS

## 2017-02-04 MED ORDER — ONDANSETRON HCL 4 MG PO TABS: 4.0000 mg | ORAL_TABLET | Freq: Four times a day (QID) | ORAL | Status: DC | PRN

## 2017-02-04 MED ORDER — ACETAMINOPHEN 325 MG PO TABS: 650.0000 mg | ORAL_TABLET | Freq: Four times a day (QID) | ORAL | Status: DC | PRN

## 2017-02-04 MED ORDER — SULFAMETHOXAZOLE-TRIMETHOPRIM 800-160 MG PO TABS: 1.0000 | ORAL_TABLET | Freq: Two times a day (BID) | ORAL | 0 refills | Status: DC

## 2017-02-04 MED ORDER — ASPIRIN EC 81 MG PO TBEC
81.0000 mg | DELAYED_RELEASE_TABLET | Freq: Every day | ORAL | Status: DC
  Administered 2017-02-05 – 2017-02-06 (×2): 81 mg via ORAL
  Filled 2017-02-04 (×2): qty 1

## 2017-02-04 MED ORDER — INSULIN ASPART 100 UNIT/ML ~~LOC~~ SOLN
0.0000 [IU] | Freq: Three times a day (TID) | SUBCUTANEOUS | Status: DC
  Administered 2017-02-05: 2 [IU] via SUBCUTANEOUS

## 2017-02-04 NOTE — ED Provider Notes (Signed)
[facility] DEPT Provider Note   CSN: [id number] Arrival date & time: 02/04/17  1807     History   Chief Complaint Chief Complaint  Patient presents with  . Dysuria    HPI Chad Martinez is a 82 y.o. male.  HPI Patient presents the emergency department with increasing lower abdominal discomfort and urinary urgency with poor urinary stream over the past 48 hours.  Generalized weakness today.  Chills without documented fever.  Patient reports some dysuria.  He's had issues of prostate infection before.  He was seen his primary care doctor and labs are completed and demonstrated a creatinine of 3.1 and thus he was sent to the ER for further evaluation.  Symptoms are mild in severity.   Past Medical History:  Diagnosis Date  . Colon polyps   . Coronary artery disease    s/p CABG x 6  . Detached retina    Partial  . Elevated PSA   . Enlarged prostate   . Hyperlipidemia   . Hypertension   . Pneumothorax 12/10/2011   right rib fx after fall from ladder  . Reflux   . Syncope    Permanent Pacemaker 12/11/2011 MDT Adapta    Patient Active Problem List   Diagnosis Date Noted  . Impingement syndrome of left shoulder 10/10/2016  . Type 2 diabetes mellitus without complication, without long-term current use of insulin ([place]) 10/10/2016  . CAD (coronary artery disease) 03/06/2016  . Shoulder pain, left 01/10/2016  . Pacemaker 12/19/2012  . Mobitz type 2 second degree AV block 12/19/2012  . Hx of CABG x 6 2008 12/19/2012  . Hyperlipemia 10/22/2012  . Elevated PSA 10/22/2012  . HTN (hypertension) 12/11/2011  . Bradycardia 12/10/2011  . Ribs, multiple fractures, right, closed, initial encounter 12/10/2011  . Pneumothorax, right 12/10/2011    Past Surgical History:  Procedure Laterality Date  . BYPASS GRAFT  10/08   LIMA to LAD,SVG to 2nd & 3rd obtuse marginal CX,SVG to PDA,1st posterolateral and 2nd posterolateral RCA  . COLONOSCOPY N/A 01/25/2013   Procedure: COLONOSCOPY;   Surgeon: Daneil Dolin, MD;  Location: [facility] SUITE;  Service: Endoscopy;  Laterality: N/A;  8:30 Am  . CORONARY ARTERY BYPASS GRAFT  04/19/2012  . EYE SURGERY  2004  . HEMORROIDECTOMY    . NM MYOVIEW LTD  11/05/2010   no ischemia  . PERMANENT PACEMAKER INSERTION  12/11/2011   MDT Adapta  . PERMANENT PACEMAKER INSERTION N/A 12/11/2011   Procedure: PERMANENT PACEMAKER INSERTION;  Surgeon: Sanda Klein, MD;  Location: [facility] CATH LAB;  Service: Cardiovascular;  Laterality: N/A;  . SKIN GRAFT  2012  . US ECHOCARDIOGRAPHY  10/14/2007   mild mitral annular ca+,EF 55%,AOV mildly sclerotic       Home Medications    Prior to Admission medications   Medication Sig Start Date End Date Taking? Authorizing Provider  aspirin EC 81 MG tablet Take 81 mg by mouth daily.   Yes [provider]  metoprolol tartrate (LOPRESSOR) 25 MG tablet Take 1 tablet (25 mg total) by mouth 2 (two) times daily. 03/05/16  Yes Croitoru, Mihai, MD  nitroGLYCERIN (NITROSTAT) 0.4 MG SL tablet Place 0.4 mg under the tongue every 5 (five) minutes as needed for chest pain (x 3 doses).   Yes [provider]  simvastatin (ZOCOR) 80 MG tablet Take 1 tablet (80 mg total) by mouth at bedtime. 03/05/16  Yes Croitoru, Mihai, MD  sulfamethoxazole-trimethoprim (BACTRIM DS,SEPTRA DS) 800-160 MG tablet Take 1 tablet by  mouth 2 (two) times daily. 02/04/17   Kathyrn Drown, MD    Family History Family History  Problem Relation Age of Onset  . Pneumonia Mother   . Multiple sclerosis Father   . Lung cancer Brother     Social History Social History  Substance Use Topics  . Smoking status: Former Smoker    Packs/day: 3.00    Years: 20.00    Quit date: 12/19/1974  . Smokeless tobacco: Never Used  . Alcohol use No     Allergies   Patient has no known allergies.   Review of Systems Review of Systems  All other systems reviewed and are negative.    Physical Exam Updated Vital Signs BP (!) 193/92 (BP Location:  Left Arm)   Pulse 80   Temp 99.6 F (37.6 C) (Oral)   Resp (!) 28   SpO2 96%   Physical Exam  Constitutional: He is oriented to person, place, and time. He appears well-developed and well-nourished.  HENT:  Head: Normocephalic and atraumatic.  Eyes: EOM are normal.  Neck: Normal range of motion.  Cardiovascular: Normal rate, regular rhythm and normal heart sounds.   Pulmonary/Chest: Effort normal and breath sounds normal. No respiratory distress.  Abdominal: Soft. He exhibits no distension. There is no tenderness.  Musculoskeletal: Normal range of motion.  Neurological: He is alert and oriented to person, place, and time.  Skin: Skin is warm and dry.  Psychiatric: He has a normal mood and affect. Judgment normal.  Nursing note and vitals reviewed.    ED Treatments / Results  Labs (all labs ordered are listed, but only abnormal results are displayed) Labs Reviewed  CBC - Abnormal; Notable for the following:       Result Value   Platelets 114 (*)    All other components within normal limits  BASIC METABOLIC PANEL - Abnormal; Notable for the following:    Glucose, Bld 268 (*)    BUN 39 (*)    Creatinine, Ser 2.99 (*)    Calcium 8.3 (*)    GFR calc non Af Amer 18 (*)    GFR calc Af Amer 21 (*)    All other components within normal limits  URINALYSIS, ROUTINE W REFLEX MICROSCOPIC - Abnormal; Notable for the following:    Color, Urine AMBER (*)    Hgb urine dipstick SMALL (*)    Nitrite POSITIVE (*)    Squamous Epithelial / LPF 0-5 (*)    All other components within normal limits  CULTURE, BLOOD (ROUTINE X 2)  CULTURE, BLOOD (ROUTINE X 2)  URINE CULTURE   BUN  Date Value Ref Range Status  02/04/2017 39 (H) 6 - 20 mg/dL Final  02/04/2017 39 (H) 6 - 20 mg/dL Final  10/08/2016 19 8 - 27 mg/dL Final  10/05/2015 20 8 - 27 mg/dL Final  10/07/2014 20 8 - 27 mg/dL Final  09/21/2013 23 6 - 23 mg/dL Final  10/19/2012 22 6 - 23 mg/dL Final   Creat  Date Value Ref Range  Status  09/21/2013 0.94 0.50 - 1.35 mg/dL Final  10/19/2012 1.09 0.50 - 1.35 mg/dL Final   Creatinine, Ser  Date Value Ref Range Status  02/04/2017 2.99 (H) 0.61 - 1.24 mg/dL Final  02/04/2017 3.01 (H) 0.61 - 1.24 mg/dL Final  10/08/2016 1.05 0.76 - 1.27 mg/dL Final  10/05/2015 1.10 0.76 - 1.27 mg/dL Final      EKG  EKG Interpretation None       Radiology No  results found.  Procedures Procedures (including critical care time)  Medications Ordered in ED Medications  cefTRIAXone (ROCEPHIN) 1 g in dextrose 5 % 50 mL IVPB (not administered)     Initial Impression / Assessment and Plan / ED Course  I have reviewed the triage vital signs and the nursing notes.  Pertinent labs & imaging results that were available during my care of the patient were reviewed by me and considered in my medical decision making (see chart for details).    8:49 PM  1.6 liters of urine after placement of foley. Nitrite + urine. Urine culture sent,. IV rocephin now. Admit for acute renal failure likely secondary to post renal obstruction.    Final Clinical Impressions(s) / ED Diagnoses   Final diagnoses:  Acute urinary retention  Acute renal failure, unspecified acute renal failure type ([place])  Acute cystitis without hematuria    New Prescriptions New Prescriptions   No medications on file     Jola Schmidt, MD 02/04/17 2051

## 2017-02-04 NOTE — ED Notes (Signed)
Dr Wolfgang Phoenix spoke with Dr. Roderic Palau. Advised dr Roderic Palau meets sus sepsis orders, vo to wait to order the sepsis protocol.

## 2017-02-04 NOTE — ED Notes (Signed)
Dr. Lama at bedside. 

## 2017-02-04 NOTE — Progress Notes (Addendum)
   Subjective:    Patient ID: GEN CLAGG, male    DOB: October 23, 1934, 82 y.o.   MRN: [id number]  Urinary Frequency   This is a new problem. Episode onset: 4 days. Associated symptoms include frequency. Associated symptoms comments: Abdominal pain. Treatments tried: azo.  Patient relates urinary frequency small amounts of urine feels a lot of lower abdominal pressure and pain has a history of BPH also relates dysuria denies high fever chills denies nausea vomiting states he feels bad finds himself feeling thirsty denies headaches relates he does feel weak    Review of Systems  Constitutional: Negative for fatigue and fever.  Genitourinary: Positive for frequency.   Denies chest pressure tightness pain    Objective:   Physical Exam Lungs are clear no crackles heart regular pulse normal Extremities no edema Abdomen lower abdominal tenderness midline and some to the left lower quadrant Significant BPH with moderate tenderness UA with occasional WBC  Patient does not appear toxic current    Assessment & Plan:  BPH with probable prostatitis as well as difficulty urinating and UTI Rocephin 500 mg IM Bactrim DS twice a day Stat lab work to look at kidney function and white blood count await the results If significant abnormality the patient will be redirected to the ER for further evaluation and possible admission Recheck the patient in 2 days   addendum-creatinine came back at 3.0 previous creatinines were normal for age.raises into question dehydration versus BPH causing post renal obstruction -recommend referral to ER for further evaluation

## 2017-02-04 NOTE — ED Triage Notes (Signed)
Dr Chad Martinez called and stated pt has had dysuria x 2 days. Labs done and creatinine 3.1 and was sent here. Pt denies pain at this time. Appears sob but pt states he is not.

## 2017-02-04 NOTE — H&P (Addendum)
TRH H&P    Patient Demographics:    Chad Martinez, is a 82 y.o. male  MRN: [id number]  DOB - Sep 23, 1934  Admit Date - 02/04/2017  Referring MD/NP/PA: Dr Venora Maples  Outpatient Primary MD for the patient is Wolfgang Phoenix, Grace Bushy, MD  Patient coming from: Home  Chief Complaint  Patient presents with  . Dysuria      HPI:    Chad Martinez  is a 82 y.o. male, With history of diabetes mellitus, CAD status post CABG, status post pacemaker placement for Mobitz type II AV block, elevated PSA, followed by urologist Dr. Rosana Hoes at [facility], came to hospital visit abdominal pain unable to urinate for past 48 hours . Patient complains of dysuria few days prior to this episode. He was seen by PCP today, labs drawn at PCP office showed creatinine 3.1, patient was admitted to the ED for further evaluation.  He denies fever or chills. No nausea vomiting or diarrhea. Denies constipation. Denies sweating, no chest pain or shortness of breath. He admits to having frequency of urination, positive urgency. He complains of suprapubic pain, which is improved after Foley catheter was inserted in the ED. He also complains of left flank pain.     Review of systems:      All other systems reviewed and are negative.   With Past History of the following :    Past Medical History:  Diagnosis Date  . Colon polyps   . Coronary artery disease    s/p CABG x 6  . Detached retina    Partial  . Elevated PSA   . Enlarged prostate   . Hyperlipidemia   . Hypertension   . Pneumothorax 12/10/2011   right rib fx after fall from ladder  . Reflux   . Syncope    Permanent Pacemaker 12/11/2011 MDT Adapta      Past Surgical History:  Procedure Laterality Date  . BYPASS GRAFT  10/08   LIMA to LAD,SVG to 2nd & 3rd obtuse marginal CX,SVG to PDA,1st posterolateral and 2nd posterolateral RCA  . COLONOSCOPY N/A 01/25/2013   Procedure: COLONOSCOPY;   Surgeon: Daneil Dolin, MD;  Location: [facility];  Service: Endoscopy;  Laterality: N/A;  8:30 Am  . CORONARY ARTERY BYPASS GRAFT  04/19/2012  . EYE SURGERY  2004  . HEMORROIDECTOMY    . NM MYOVIEW LTD  11/05/2010   no ischemia  . PERMANENT PACEMAKER INSERTION  12/11/2011   MDT Adapta  . PERMANENT PACEMAKER INSERTION N/A 12/11/2011   Procedure: PERMANENT PACEMAKER INSERTION;  Surgeon: Sanda Klein, MD;  Location: [facility];  Service: Cardiovascular;  Laterality: N/A;  . SKIN GRAFT  2012  . US ECHOCARDIOGRAPHY  10/14/2007   mild mitral annular ca+,EF 55%,AOV mildly sclerotic      Social History:      Social History  Substance Use Topics  . Smoking status: Former Smoker    Packs/day: 3.00    Years: 20.00    Quit date: 12/19/1974  . Smokeless tobacco: Never Used  . Alcohol use No  Family History :     Family History  Problem Relation Age of Onset  . Pneumonia Mother   . Multiple sclerosis Father   . Lung cancer Brother       Home Medications:   Prior to Admission medications   Medication Sig Start Date End Date Taking? Authorizing Provider  aspirin EC 81 MG tablet Take 81 mg by mouth daily.   Yes [provider]  metoprolol tartrate (LOPRESSOR) 25 MG tablet Take 1 tablet (25 mg total) by mouth 2 (two) times daily. 03/05/16  Yes Croitoru, Mihai, MD  nitroGLYCERIN (NITROSTAT) 0.4 MG SL tablet Place 0.4 mg under the tongue every 5 (five) minutes as needed for chest pain (x 3 doses).   Yes [provider]  simvastatin (ZOCOR) 80 MG tablet Take 1 tablet (80 mg total) by mouth at bedtime. 03/05/16  Yes Croitoru, Mihai, MD  sulfamethoxazole-trimethoprim (BACTRIM DS,SEPTRA DS) 800-160 MG tablet Take 1 tablet by mouth 2 (two) times daily. 02/04/17   Kathyrn Drown, MD     Allergies:    No Known Allergies   Physical Exam:   Vitals  Blood pressure (!) 150/67, pulse 80, temperature 99.6 F (37.6 C), temperature source Oral, resp. rate 17, SpO2 96  %.  1.  General: Appears in no acute distress  2. Psychiatric:  Intact judgement and  insight, awake alert, oriented x 3.  3. Neurologic: No focal neurological deficits, all cranial nerves intact.Strength 5/5 all 4 extremities, sensation intact all 4 extremities, plantars down going.  4. Eyes :  anicteric sclerae, moist conjunctivae with no lid lag. PERRLA.  5. ENMT:  Oropharynx clear with moist mucous membranes and good dentition  6. Neck:  supple, no cervical lymphadenopathy appriciated, No thyromegaly  7. Respiratory : Normal respiratory effort, good air movement bilaterally,clear to  auscultation bilaterally  8. Cardiovascular : RRR, no gallops, rubs or murmurs, no leg edema  9. Gastrointestinal:  Positive bowel sounds, abdomen soft, positive suprapubic tenderness to palpation, no CVA tenderness bilaterally     10. Skin:  No cyanosis, normal texture and turgor, no rash, lesions or ulcers  11.Musculoskeletal:  Good muscle tone,  joints appear normal , no effusions,  normal range of motion    Data Review:    CBC  Recent Labs Lab 02/04/17 1535 02/04/17 1830  WBC 9.6 9.0  HGB 14.5 13.8  HCT 42.1 39.9  PLT 117* 114*  MCV 88.4 87.9  MCH 30.5 30.4  MCHC 34.4 34.6  RDW 12.9 13.0  LYMPHSABS 0.9  --   MONOABS 1.3*  --   EOSABS 0.1  --   BASOSABS 0.0  --    ------------------------------------------------------------------------------------------------------------------  Chemistries   Recent Labs Lab 02/04/17 1535 02/04/17 1830  NA 144 138  K 4.3 3.9  CL 110 107  CO2 26 22  GLUCOSE 168* 268*  BUN 39* 39*  CREATININE 3.01* 2.99*  CALCIUM 8.8* 8.3*   ------------------------------------------------------------------------------------------------------------------  ------------------------------------------------------------------------------------------------------------------  No results for input(s): VITAMINB12, FOLATE, FERRITIN, TIBC, IRON,  RETICCTPCT in the last 72 hours.  --------------------------------------------------------------------------------------------------------------- Urine analysis:    Component Value Date/Time   COLORURINE AMBER (A) 02/04/2017 1946   APPEARANCEUR CLEAR 02/04/2017 1946   LABSPEC 1.009 02/04/2017 1946   PHURINE 5.0 02/04/2017 1946   GLUCOSEU NEGATIVE 02/04/2017 1946   HGBUR SMALL (A) 02/04/2017 1946   BILIRUBINUR NEGATIVE 02/04/2017 1946   KETONESUR NEGATIVE 02/04/2017 1946   PROTEINUR NEGATIVE 02/04/2017 1946   NITRITE POSITIVE (A) 02/04/2017 1946   LEUKOCYTESUR NEGATIVE  02/04/2017 1946      Imaging Results:      Assessment & Plan:    Active Problems:   HTN (hypertension)   Elevated PSA   Pacemaker   Hx of CABG x 6 2008   Type 2 diabetes mellitus without complication, without long-term current use of insulin (HCC)   AKI (acute kidney injury) ([place])   1. Acute kidney injury- likely postobstructive from urinary retention, after Foley catheter was inserted 1.6 L of urine obtained in Foley bag. Keep kvo. Will obtain renal ultrasound in a.m. 2. Acute urinary retention- likely from BPH/prostatitis, Foley catheter has been inserted and Foley is draining well. Patient and his wife wants to change his urologist and wants to follow-up [facility]. I called and discussed with Dr. Louis Meckel from [facility] recommends discharging patient home with Foley and will try voiding trial in 10 days as outpatient. He also recommended to start Flomax. Will start 0.4 mg Flomax daily. 3. UTI- UA is positive for nitrate, patient started on ceftriaxone. Follow urine culture results. 4. Diabetes mellitus-initiate sliding scale insulin with NovoLog. 5. CAD status post CABG/pacemaker- stable, continue aspirin, metoprolol, Zocor   DVT Prophylaxis-   SCDs, as patient had hematuria after insertion of Foley catheter.   AM Labs Ordered, also please review Full Orders  Family Communication:  Admission, patients condition and plan of care including tests being ordered have been discussed with the patient and His wife at  bedside who indicate understanding and agree with the plan and Code Status.  Code Status:  DO NOT RESUSCITATE  Admission status: Observation  Time spent in minutes : 60 minutes   Brendaliz Kuk S M.D on 02/04/2017 at 9:30 PM  Between 7am to 7pm - Pager - [phone]. After 7pm go to [email or web address] - password [facility]  [facility] - Office  [phone]

## 2017-02-05 ENCOUNTER — Inpatient Hospital Stay (HOSPITAL_COMMUNITY): Payer: Medicare Other

## 2017-02-05 DIAGNOSIS — E119 Type 2 diabetes mellitus without complications: Secondary | ICD-10-CM

## 2017-02-05 DIAGNOSIS — N179 Acute kidney failure, unspecified: Secondary | ICD-10-CM | POA: Diagnosis not present

## 2017-02-05 DIAGNOSIS — R338 Other retention of urine: Secondary | ICD-10-CM

## 2017-02-05 DIAGNOSIS — Z951 Presence of aortocoronary bypass graft: Secondary | ICD-10-CM | POA: Diagnosis not present

## 2017-02-05 DIAGNOSIS — I10 Essential (primary) hypertension: Secondary | ICD-10-CM

## 2017-02-05 LAB — COMPREHENSIVE METABOLIC PANEL
ALT: 12 U/L — ABNORMAL LOW (ref 17–63)
AST: 16 U/L (ref 15–41)
Albumin: 3.3 g/dL — ABNORMAL LOW (ref 3.5–5.0)
Alkaline Phosphatase: 41 U/L (ref 38–126)
Anion gap: 5 (ref 5–15)
BILIRUBIN TOTAL: 1.4 mg/dL — AB (ref 0.3–1.2)
BUN: 32 mg/dL — AB (ref 6–20)
CO2: 27 mmol/L (ref 22–32)
Calcium: 8 mg/dL — ABNORMAL LOW (ref 8.9–10.3)
Chloride: 113 mmol/L — ABNORMAL HIGH (ref 101–111)
Creatinine, Ser: 1.92 mg/dL — ABNORMAL HIGH (ref 0.61–1.24)
GFR calc Af Amer: 36 mL/min — ABNORMAL LOW (ref >60)
GFR, EST NON AFRICAN AMERICAN: 31 mL/min — AB (ref >60)
Glucose, Bld: 153 mg/dL — ABNORMAL HIGH (ref 65–99)
POTASSIUM: 3.8 mmol/L (ref 3.5–5.1)
Sodium: 145 mmol/L (ref 135–145)
TOTAL PROTEIN: 6 g/dL — AB (ref 6.5–8.1)

## 2017-02-05 LAB — CBC
HEMATOCRIT: 37 % — AB (ref 39.0–52.0)
Hemoglobin: 12.7 g/dL — ABNORMAL LOW (ref 13.0–17.0)
MCH: 30.4 pg (ref 26.0–34.0)
MCHC: 34.3 g/dL (ref 30.0–36.0)
MCV: 88.5 fL (ref 78.0–100.0)
Platelets: 114 10*3/uL — ABNORMAL LOW (ref 150–400)
RBC: 4.18 MIL/uL — ABNORMAL LOW (ref 4.22–5.81)
RDW: 13.1 % (ref 11.5–15.5)
WBC: 7.3 10*3/uL (ref 4.0–10.5)

## 2017-02-05 LAB — GLUCOSE, CAPILLARY
GLUCOSE-CAPILLARY: 172 mg/dL — AB (ref 65–99)
GLUCOSE-CAPILLARY: 157 mg/dL — AB (ref 65–99)
Glucose-Capillary: 118 mg/dL — ABNORMAL HIGH (ref 65–99)
Glucose-Capillary: 161 mg/dL — ABNORMAL HIGH (ref 65–99)

## 2017-02-05 MED ORDER — SODIUM CHLORIDE 0.45 % IV SOLN
INTRAVENOUS | Status: DC
  Administered 2017-02-05 – 2017-02-06 (×3): via INTRAVENOUS

## 2017-02-05 MED ORDER — DEXTROSE 5 % IV SOLN
1.0000 g | INTRAVENOUS | Status: DC
  Filled 2017-02-05: qty 10

## 2017-02-05 NOTE — Progress Notes (Signed)
PROGRESS NOTE    Chad Martinez  PZW:[id number] DOB: 03-19-1935 DOA: 02/04/2017 PCP: Mikey Kirschner, MD    Brief Narrative:  82 year old male presented with dysuria. Patient is known to have type 2 diabetes mellitus, coronary artery disease status post CABG, status post pacemaker due to second-degree type II AV block. Patient complained of unable to urinate for about 48 hours. Found in acute renal failure, by his primary care physician. He complained of suprapubic abdominal pain, increased urinary frequency, or urgency and dysuria. On his initial physical examination, blood pressure 150/67, heart rate 80, temperature 99.6, respiratory rate 17, oxygen saturation 96%. Moist mucous membranes, lungs clear to auscultation bilaterally, heart S1-S2 present rhythmic, the abdomen was soft, positive suprapubic tenderness, no costovertebral angle tenderness, no lower extremity edema. Creatinine was elevated, up to 2.99. Patient admitted to the hospital with working diagnosis of acute urinary retention, complicated by post renal, renal failure.    Assessment & Plan:   Active Problems:   HTN (hypertension)   Elevated PSA   Pacemaker   Hx of CABG x 6 2008   Type 2 diabetes mellitus without complication, without long-term current use of insulin (HCC)   AKI (acute kidney injury) ([place])   1. AKI post renal-renal failure, with hyper-Natremia and hyperchloremia, . Will continue to follow on renal function and electrolytes, patient with postobstructive nephropathy, with a urine output up to 5,450 ml over the last 24 hours, due to losing urine concentrating capacity, will placed patient on IV fluids with half normal saline at 125 ml per hour and will continue to monitor in and outs. Will use hypotonic saline to prevent worsening hyper-Natremia and hyper-Chloremia. Serum bicarbonate at 27, cw contraction alkalosis.   2. Urine tract infection. Urine analysis with 0-5 wbc, culture has no growth, will dc antibiotic  therapy and will continue to follow on cell count, temperature curve and cultures.   3. T2DM. Will continue insulin sliding scale for glucose cover and monitoring, capillary glucose 163, 186, 172, 118.   4. CAD sp CABG. Will continue aspirin, metoprolol and atorvastatin.    DVT prophylaxis: scd  Code Status: full  Family Communication:  Disposition Plan: home    Consultants:   Urology   Procedures:    Antimicrobials:    Subjective: Patient feeling better, no nausea or vomiting, no dyspnea or edema. Foley catheter in place, no hematuria or pain at the site of the catheter.   Objective: Vitals:   02/04/17 2100 02/04/17 2130 02/04/17 2225 02/05/17 0542  BP: (!) 150/67 (!) 163/73 (!) 177/82 139/60  Pulse: 80 72 85 72  Resp: 17 18  15   Temp:   97.6 F (36.4 C) 98.9 F (37.2 C)  TempSrc:   Oral Oral  SpO2: 96% 97% 98% 96%  Weight:   86.2 kg (190 lb)   Height:   5\' 8"  (1.727 m)     Intake/Output Summary (Last 24 hours) at 02/05/17 1011 Last data filed at 02/05/17 0544  Gross per 24 hour  Intake              100 ml  Output             5450 ml  Net            -5350 ml   Filed Weights   02/04/17 2225  Weight: 86.2 kg (190 lb)    Examination:  General exam: deconditioned E ENT: no pallor or icterus, oral mucosa moist.  Respiratory system: Clear to  auscultation. Respiratory effort normal. No wheezing, rales or rhonchi.  Cardiovascular system: S1 & S2 heard, RRR. No JVD, murmurs, rubs, gallops or clicks. No pedal edema. Gastrointestinal system: Abdomen is nondistended, soft and nontender. No organomegaly or masses felt. Normal bowel sounds heard. No suprapubic mass palpated, no CV tenderness.  Central nervous system: Alert and oriented. No focal neurological deficits. Extremities: Symmetric 5 x 5 power. Skin: No rashes, lesions or ulcers     Data Reviewed: I have personally reviewed following labs and imaging studies  CBC:  Recent Labs Lab 02/04/17 1535  02/04/17 1830 02/05/17 0523  WBC 9.6 9.0 7.3  NEUTROABS 7.3  --   --   HGB 14.5 13.8 12.7*  HCT 42.1 39.9 37.0*  MCV 88.4 87.9 88.5  PLT 117* 114* 106*   Basic Metabolic Panel:  Recent Labs Lab 02/04/17 1535 02/04/17 1830 02/05/17 0523  NA 144 138 145  K 4.3 3.9 3.8  CL 110 107 113*  CO2 26 22 27   GLUCOSE 168* 268* 153*  BUN 39* 39* 32*  CREATININE 3.01* 2.99* 1.92*  CALCIUM 8.8* 8.3* 8.0*   GFR: Estimated Creatinine Clearance: 32.8 mL/min (A) (by C-G formula based on SCr of 1.92 mg/dL (H)). Liver Function Tests:  Recent Labs Lab 02/05/17 0523  AST 16  ALT 12*  ALKPHOS 41  BILITOT 1.4*  PROT 6.0*  ALBUMIN 3.3*   No results for input(s): LIPASE, AMYLASE in the last 168 hours. No results for input(s): AMMONIA in the last 168 hours. Coagulation Profile: No results for input(s): INR, PROTIME in the last 168 hours. Cardiac Enzymes: No results for input(s): CKTOTAL, CKMB, CKMBINDEX, TROPONINI in the last 168 hours. BNP (last 3 results) No results for input(s): PROBNP in the last 8760 hours. HbA1C: No results for input(s): HGBA1C in the last 72 hours. CBG:  Recent Labs Lab 02/05/17 0834  GLUCAP 172*   Lipid Profile: No results for input(s): CHOL, HDL, LDLCALC, TRIG, CHOLHDL, LDLDIRECT in the last 72 hours. Thyroid Function Tests: No results for input(s): TSH, T4TOTAL, FREET4, T3FREE, THYROIDAB in the last 72 hours. Anemia Panel: No results for input(s): VITAMINB12, FOLATE, FERRITIN, TIBC, IRON, RETICCTPCT in the last 72 hours. Sepsis Labs: No results for input(s): PROCALCITON, LATICACIDVEN in the last 168 hours.  Recent Results (from the past 240 hour(s))  Blood culture (routine x 2)     Status: None (Preliminary result)   Collection Time: 02/04/17  8:02 PM  Result Value Ref Range Status   Specimen Description BLOOD LEFT ARM  Final   Special Requests   Final    BOTTLES DRAWN AEROBIC AND ANAEROBIC Blood Culture adequate volume   Culture NO GROWTH < 12  HOURS  Final   Report Status PENDING  Incomplete  Blood culture (routine x 2)     Status: None (Preliminary result)   Collection Time: 02/04/17  8:05 PM  Result Value Ref Range Status   Specimen Description BLOOD LEFT ARM  Final   Special Requests   Final    BOTTLES DRAWN AEROBIC AND ANAEROBIC Blood Culture adequate volume   Culture NO GROWTH < 12 HOURS  Final   Report Status PENDING  Incomplete         Radiology Studies: US Renal  Result Date: 02/05/2017 CLINICAL DATA:  Acute renal injury, dysuria. History of diabetes, BPH. EXAM: RENAL / URINARY TRACT ULTRASOUND COMPLETE COMPARISON:  None in PACs FINDINGS: Right Kidney: Length: 11.7 cm. There is mild hydronephrosis. The renal cortical echotexture remains lower than  that of the liver. Left Kidney: Length: 11.8 cm. There is mild hydronephrosis. The renal cortical echotexture is similar to that on the right. Bladder: The urinary bladder is decompressed by Foley catheter. The urinary bladder wall is subjectively thickened. IMPRESSION: Mild bilateral hydronephrosis.  Normal renal cortical echotexture. Decompressed urinary bladder with a subjectively thickened wall. Electronically Signed   By: David  Martinique M.D.   On: 02/05/2017 09:29        Scheduled Meds: . aspirin EC  81 mg Oral Daily  . atorvastatin  40 mg Oral q1800  . insulin aspart  0-9 Units Subcutaneous TID WC  . metoprolol tartrate  25 mg Oral BID  . tamsulosin  0.4 mg Oral Daily   Continuous Infusions: . sodium chloride 10 mL/hr at 02/04/17 2300  . cefTRIAXone (ROCEPHIN)  IV       LOS: 1 day      Tawni Millers, MD [facility] Pager [phone]  If 7PM-7AM, please contact night-coverage [email or web address] Password [email or web address] 02/05/2017, 10:11 AM

## 2017-02-05 NOTE — Progress Notes (Signed)
Pharmacy Antibiotic Note  JANTZ MAIN is a 82 y.o. male admitted on 02/04/2017 with UTI.  Pharmacy has been consulted for Ceftriaxone dosing.  Plan: Ceftriaxone 1gm IV q24h F/u cxs and clinical progress  Height: 5\' 8"  (172.7 cm) Weight: 190 lb (86.2 kg) IBW/kg (Calculated) : 68.4  Temp (24hrs), Avg:99 F (37.2 C), Min:97.6 F (36.4 C), Max:99.8 F (37.7 C)   Recent Labs Lab 02/04/17 1535 02/04/17 1830 02/05/17 0523  WBC 9.6 9.0 7.3  CREATININE 3.01* 2.99* 1.92*    Estimated Creatinine Clearance: 32.8 mL/min (A) (by C-G formula based on SCr of 1.92 mg/dL (H)).    No Known Allergies  Antimicrobials this admission: Ceftriaxone 7/31>>   Microbiology results: 7/31 BCx: pending 7/31 UCx: pending  Thank you for allowing pharmacy to be a part of this patient's care.  Isac Sarna, BS Pharm D, [place] Clinical Pharmacist Pager [phone] 02/05/2017 7:35 AM

## 2017-02-05 NOTE — Care Management CC44 (Signed)
Condition Code 44 Documentation Completed  Patient Details  Name: Chad Martinez MRN: [id number] Date of Birth: 10/11/1934   Condition Code 44 given:  Yes Patient signature on Condition Code 44 notice:  Yes Documentation of 2 MD's agreement:  Yes Code 44 added to claim:  Yes    Sherald Barge, RN 02/05/2017, 10:48 AM

## 2017-02-05 NOTE — Progress Notes (Signed)
PROGRESS NOTE [facility]   Please see attending progress note for the day.  Chad Martinez M.D  Chad Martinez   VCB:[id number] DOB: Feb 24, 1935  DOA: 02/04/2017 PCP: Mikey Kirschner, MD   Brief Narrative:  82 yo male with PMH of non insulin dependent diabetes, CAD s/p CABG, s/p pacemaker for a type 2 AV block, BPH followed by urology, presented to the ED with 2 days of urinary retention, flank pain, and abdominal pain. He has never experienced this before. Initially seen by PCP and creatinine was found to be 3.1. In the ED catheter was place and 1.6 L of urine of taken off. Foley was left in place and Flomax was started along with rocephin for UTI.   Subjective: Patient is sitting up in bed comfortably non toxic appearing. States that he is feeling much better. Denies fever, dizziness, confusion, chest pain, palpitations, shortness of breath, abdominal pain, flank pain, groin pain, and swelling.   Assessment & Plan:   Active Problems:   HTN (hypertension)   Elevated PSA   Pacemaker   Hx of CABG x 6 2008   Type 2 diabetes mellitus without complication, without long-term current use of insulin (HCC)   AKI (acute kidney injury) ([place])   Obstructive uropathy due to BPH  - Flomax  - 100 ml/ hr 1/2 NS (chloride at 113 and Na at 145)  - foley cath placed - 5 L out today  - trend CMP  - urology consult  AKI due to urinary retention   - improving   - GFR was 67 4/3, currently at 31 improved from 18 at admission   - creatinine  1.05 on 4/3, currently 1.92 improved from 3.01  - foley cath in place  UTI  -rocephin  Non insuline dependent type 2 DM  - sliding scale   CAD s/p CABG/pacemaker  - aspirin  - beta blocker  - zocor   DVT prophylaxis: scd Code Status: DNR DNI Family Communication: spoke with wife about plan today Disposition Plan: trending bmp if stable DC   Consultants:   urology  Procedures:    Antimicrobials:  rocephin   Objective: Vitals:   02/04/17 2130 02/04/17 2225 02/05/17 0542 02/05/17 1113  BP: (!) 163/73 (!) 177/82 139/60   Pulse: 72 85 72   Resp: 18  15   Temp:  97.6 F (36.4 C) 98.9 F (37.2 C)   TempSrc:  Oral Oral   SpO2: 97% 98% 96% 96%  Weight:  86.2 kg (190 lb)    Height:  5\' 8"  (1.727 m)      Intake/Output Summary (Last 24 hours) at 02/05/17 1225 Last data filed at 02/05/17 0544  Gross per 24 hour  Intake              100 ml  Output             5450 ml  Net            -5350 ml   Filed Weights   02/04/17 2225  Weight: 86.2 kg (190 lb)    Examination:  General exam: Appears calm and comfortable  HEENT: NCAT, PERRL, OP moist and clear Respiratory system: Clear to auscultation. No wheezes,crackle or rhonchi Cardiovascular system: S1 & S2 heard, RRR. No JVD, murmurs, rubs or gallops Gastrointestinal system: Abdomen is nondistended, soft and nontender. Normal bowel sounds heard. GU: foley catheter in place with out pain, pelvis is non tender, no cva tenderness, 600 ml of urine in  the bag light pink in color Central nervous system: Alert and oriented. No focal neurological deficits. Extremities: No pedal edema Skin: No rashes, lesions or ulcers Psychiatry: Judgement and insight appear normal. Mood & affect appropriate.    Data Reviewed: I have personally reviewed following labs and imaging studies  CBC:  Recent Labs Lab 02/04/17 1535 02/04/17 1830 02/05/17 0523  WBC 9.6 9.0 7.3  NEUTROABS 7.3  --   --   HGB 14.5 13.8 12.7*  HCT 42.1 39.9 37.0*  MCV 88.4 87.9 88.5  PLT 117* 114* 440*   Basic Metabolic Panel:  Recent Labs Lab 02/04/17 1535 02/04/17 1830 02/05/17 0523  NA 144 138 145  K 4.3 3.9 3.8  CL 110 107 113*  CO2 26 22 27   GLUCOSE 168* 268* 153*  BUN 39* 39* 32*  CREATININE 3.01* 2.99* 1.92*  CALCIUM 8.8* 8.3* 8.0*   GFR: Estimated Creatinine Clearance: 32.8 mL/min (A) (by C-G formula based on SCr of 1.92 mg/dL  (H)). Liver Function Tests:  Recent Labs Lab 02/05/17 0523  AST 16  ALT 12*  ALKPHOS 41  BILITOT 1.4*  PROT 6.0*  ALBUMIN 3.3*   No results for input(s): LIPASE, AMYLASE in the last 168 hours. No results for input(s): AMMONIA in the last 168 hours. Coagulation Profile: No results for input(s): INR, PROTIME in the last 168 hours. Cardiac Enzymes: No results for input(s): CKTOTAL, CKMB, CKMBINDEX, TROPONINI in the last 168 hours. BNP (last 3 results) No results for input(s): PROBNP in the last 8760 hours. HbA1C: No results for input(s): HGBA1C in the last 72 hours. CBG:  Recent Labs Lab 02/05/17 0834 02/05/17 1157  GLUCAP 172* 118*   Lipid Profile: No results for input(s): CHOL, HDL, LDLCALC, TRIG, CHOLHDL, LDLDIRECT in the last 72 hours. Thyroid Function Tests: No results for input(s): TSH, T4TOTAL, FREET4, T3FREE, THYROIDAB in the last 72 hours. Anemia Panel: No results for input(s): VITAMINB12, FOLATE, FERRITIN, TIBC, IRON, RETICCTPCT in the last 72 hours. Sepsis Labs: No results for input(s): PROCALCITON, LATICACIDVEN in the last 168 hours.  Recent Results (from the past 240 hour(s))  Blood culture (routine x 2)     Status: None (Preliminary result)   Collection Time: 02/04/17  8:02 PM  Result Value Ref Range Status   Specimen Description BLOOD LEFT ARM  Final   Special Requests   Final    BOTTLES DRAWN AEROBIC AND ANAEROBIC Blood Culture adequate volume   Culture NO GROWTH < 12 HOURS  Final   Report Status PENDING  Incomplete  Blood culture (routine x 2)     Status: None (Preliminary result)   Collection Time: 02/04/17  8:05 PM  Result Value Ref Range Status   Specimen Description BLOOD LEFT ARM  Final   Special Requests   Final    BOTTLES DRAWN AEROBIC AND ANAEROBIC Blood Culture adequate volume   Culture NO GROWTH < 12 HOURS  Final   Report Status PENDING  Incomplete         Radiology Studies: US Renal  Result Date: 02/05/2017 CLINICAL DATA:   Acute renal injury, dysuria. History of diabetes, BPH. EXAM: RENAL / URINARY TRACT ULTRASOUND COMPLETE COMPARISON:  None in PACs FINDINGS: Right Kidney: Length: 11.7 cm. There is mild hydronephrosis. The renal cortical echotexture remains lower than that of the liver. Left Kidney: Length: 11.8 cm. There is mild hydronephrosis. The renal cortical echotexture is similar to that on the right. Bladder: The urinary bladder is decompressed by Foley catheter. The urinary bladder  wall is subjectively thickened. IMPRESSION: Mild bilateral hydronephrosis.  Normal renal cortical echotexture. Decompressed urinary bladder with a subjectively thickened wall. Electronically Signed   By: David  Martinique M.D.   On: 02/05/2017 09:29      Scheduled Meds: . aspirin EC  81 mg Oral Daily  . atorvastatin  40 mg Oral q1800  . insulin aspart  0-9 Units Subcutaneous TID WC  . metoprolol tartrate  25 mg Oral BID  . tamsulosin  0.4 mg Oral Daily   Continuous Infusions: . sodium chloride    . cefTRIAXone (ROCEPHIN)  IV       LOS: 1 day     Mitzie Na, PA-S Pager: Text Page via [email or web address]  [phone]  If 7PM-7AM, please contact night-coverage [email or web address] Password [facility] 02/05/2017, 12:25 PM

## 2017-02-05 NOTE — Care Management Note (Signed)
Case Management Note  Patient Details  Name: Chad Martinez MRN: [id number] Date of Birth: [birth year]  Subjective/Objective:                  Pt admitted with AKI, urinary retention, and UTI. Pt from home with wife. Very active, drives to appointments, see's PCP regularly. He has medicare but no drug coverage and pays for medications OOP. Pt anxious about going home with foley. Wife feels she can care for it, just does not like the inconvenience of having foley. They communicate no needs.   Action/Plan: DC home with self care.   Expected Discharge Date:     02/05/2017             Expected Discharge Plan:  Home/Self Care  In-House Referral:  NA  Discharge planning Services  CM Consult  Post Acute Care Choice:  NA Choice offered to:  NA  Status of Service:  Completed, signed off  Sherald Barge, RN 02/05/2017, 10:49 AM

## 2017-02-06 ENCOUNTER — Ambulatory Visit: Payer: Medicare Other | Admitting: Family Medicine

## 2017-02-06 DIAGNOSIS — E119 Type 2 diabetes mellitus without complications: Secondary | ICD-10-CM | POA: Diagnosis not present

## 2017-02-06 DIAGNOSIS — I10 Essential (primary) hypertension: Secondary | ICD-10-CM | POA: Diagnosis not present

## 2017-02-06 DIAGNOSIS — R338 Other retention of urine: Secondary | ICD-10-CM | POA: Diagnosis not present

## 2017-02-06 DIAGNOSIS — Z951 Presence of aortocoronary bypass graft: Secondary | ICD-10-CM | POA: Diagnosis not present

## 2017-02-06 DIAGNOSIS — N179 Acute kidney failure, unspecified: Secondary | ICD-10-CM | POA: Diagnosis not present

## 2017-02-06 LAB — BASIC METABOLIC PANEL
Anion gap: 7 (ref 5–15)
BUN: 24 mg/dL — AB (ref 6–20)
CHLORIDE: 107 mmol/L (ref 101–111)
CO2: 26 mmol/L (ref 22–32)
Calcium: 7.9 mg/dL — ABNORMAL LOW (ref 8.9–10.3)
Creatinine, Ser: 1.28 mg/dL — ABNORMAL HIGH (ref 0.61–1.24)
GFR calc Af Amer: 59 mL/min — ABNORMAL LOW (ref >60)
GFR calc non Af Amer: 51 mL/min — ABNORMAL LOW (ref >60)
GLUCOSE: 144 mg/dL — AB (ref 65–99)
Potassium: 3.8 mmol/L (ref 3.5–5.1)
Sodium: 140 mmol/L (ref 135–145)

## 2017-02-06 LAB — URINE CULTURE: CULTURE: NO GROWTH

## 2017-02-06 LAB — HEMOGLOBIN A1C
Hgb A1c MFr Bld: 6.2 % — ABNORMAL HIGH (ref 4.8–5.6)
Mean Plasma Glucose: 131 mg/dL

## 2017-02-06 LAB — GLUCOSE, CAPILLARY
GLUCOSE-CAPILLARY: 145 mg/dL — AB (ref 65–99)
Glucose-Capillary: 150 mg/dL — ABNORMAL HIGH (ref 65–99)

## 2017-02-06 LAB — MAGNESIUM: MAGNESIUM: 2.2 mg/dL (ref 1.7–2.4)

## 2017-02-06 MED ORDER — TAMSULOSIN HCL 0.4 MG PO CAPS: 0.4000 mg | ORAL_CAPSULE | Freq: Every day | ORAL | 0 refills | Status: AC

## 2017-02-06 NOTE — Progress Notes (Signed)
Patient and patient's wife educated on foley catheter care.  Questions answered.  Verbalized understanding of foley cathter care.

## 2017-02-06 NOTE — Progress Notes (Signed)
Chad Martinez discharged Home per MD order.  Discharge instructions reviewed and discussed with the patient, all questions and concerns answered. Copy of instructions and scripts given to patient.  Allergies as of 02/06/2017   No Known Allergies     Medication List    STOP taking these medications   sulfamethoxazole-trimethoprim 800-160 MG tablet Commonly known as:  BACTRIM DS,SEPTRA DS     TAKE these medications   aspirin EC 81 MG tablet Take 81 mg by mouth daily.   metoprolol tartrate 25 MG tablet Commonly known as:  LOPRESSOR Take 1 tablet (25 mg total) by mouth 2 (two) times daily.   nitroGLYCERIN 0.4 MG SL tablet Commonly known as:  NITROSTAT Place 0.4 mg under the tongue every 5 (five) minutes as needed for chest pain (x 3 doses).   simvastatin 80 MG tablet Commonly known as:  ZOCOR Take 1 tablet (80 mg total) by mouth at bedtime.   tamsulosin 0.4 MG Caps capsule Commonly known as:  FLOMAX Take 1 capsule (0.4 mg total) by mouth daily.       Patients skin is clean, dry and intact, no evidence of skin break down. IV site discontinued and catheter remains intact. Site without signs and symptoms of complications. Dressing and pressure applied.   Patient escorted to car by NT in a wheelchair,  no distress noted upon discharge.  Ralene Muskrat Chad Martinez 02/06/2017 12:19 PM

## 2017-02-06 NOTE — Discharge Summary (Signed)
Physician Discharge Summary  Chad Martinez HCW:[id number] DOB: 07-23-1934 DOA: 02/04/2017  PCP: Mikey Kirschner, MD  Admit date: 02/04/2017 Discharge date: 02/06/2017  Admitted From: Home  Disposition:  Home   Recommendations for Outpatient Follow-up:  1. Follow up with PCP in 1- week 2. Follow up with Urology in 10 days, for outpatient voiding trial 3. Patient has been placed on Tamsulosin, and indwelling Foley catheter has been placed.   Home Health: No  Equipment/Devices: Indwelling Foley catheter.   Discharge Condition: Stable  CODE STATUS: Full  Diet recommendation: Heart Healthy   Brief/Interim Summary: 82 year old male presented with dysuria. Patient is known to have type 2 diabetes mellitus, coronary artery disease status post CABG, status post pacemaker due to second-degree type II AV block. Patient complained of unable to urinate for about 48 hours. Found in acute renal failure, by his primary care physician. He complained of suprapubic abdominal pain, increased urinary frequency, with urgency and dysuria. On his initial physical examination, blood pressure 150/67, heart rate 80, temperature 99.6, respiratory rate 17, oxygen saturation 96%. Moist mucous membranes, lungs clear to auscultation bilaterally, heart S1-S2 present rhythmic, the abdomen was soft, positive suprapubic tenderness, no costovertebral angle tenderness, no lower extremity edema. Sodium 138, potassium 3.9, chloride 107, bicarbonate 20, glucose 268, BUN 39, creatinine 2.99, white count 9.0, hemoglobin 13.8, hematocrit 39.9, platelets 114, urinalysis 0-5 white cells, positive nitrates, specific gravity 1.009, negative protein.   Patient admitted to the hospital with the working diagnosis of acute urinary retention, complicated by post renal, renal failure.   1. Acute kidney injury post renal- renal failure with hypernatremia and hyperchloremia. Patient had a Foley catheter placed in the emergency department,  obtaining 1.6 L of urine, with significant improvement of patient's symptoms. His kidney function continued to improve with a discharge creatinine of 1.28, serum potassium 3.8, serum bicarbonate 26. Patient received IV fluids with hypotonic solutions, half-normal saline, with improvement of sodium from 145 down to 140, chloride for 113-107. Patient developed a post obstructive nephropathy with a consequent transient loss of urine concentrating capacity. During the first 24 hours of admission his urine output reached 5,450 ml, the next 24 hours down to 3,150 ml. Patient received appropriate volume repletion with intravenous fluids. Patient was instructed to monitor his urine output and continue by mouth fluid intake. He will need a close follow-up within 7 days. Patient will follow-up with urology for an outpatient with an trial.  2. Urinary tract infection was ruled out. Patient received IV ceftriaxone on admission under the suspicion of urinary tract infection, urinalysis with no pyuria, urine culture no growth. Antibiotics were discontinued.  3. Type 2 diabetes mellitus. Patient was placed on a insulin sliding scale for glucose coverage and monitoring, his capillary glucose remained well-controlled, over last 24 hours, 172, 118, 157, 161, 145. Patient with good by mouth toleration. To resume diet control as an outpatient.  4. Coronary artery disease status post CABG. Patient will continue antiplatelet therapy with aspirin, continue simvastatin and metoprolol.  Discharge Diagnoses:  Active Problems:   HTN (hypertension)   Elevated PSA   Pacemaker   Hx of CABG x 6 2008   Type 2 diabetes mellitus without complication, without long-term current use of insulin (HCC)   AKI (acute kidney injury) ([place])    Discharge Instructions   Allergies as of 02/06/2017   No Known Allergies     Medication List    STOP taking these medications   sulfamethoxazole-trimethoprim 800-160 MG tablet Commonly known  as:  BACTRIM DS,SEPTRA DS     TAKE these medications   aspirin EC 81 MG tablet Take 81 mg by mouth daily.   metoprolol tartrate 25 MG tablet Commonly known as:  LOPRESSOR Take 1 tablet (25 mg total) by mouth 2 (two) times daily.   nitroGLYCERIN 0.4 MG SL tablet Commonly known as:  NITROSTAT Place 0.4 mg under the tongue every 5 (five) minutes as needed for chest pain (x 3 doses).   simvastatin 80 MG tablet Commonly known as:  ZOCOR Take 1 tablet (80 mg total) by mouth at bedtime.   tamsulosin 0.4 MG Caps capsule Commonly known as:  FLOMAX Take 1 capsule (0.4 mg total) by mouth daily.      Follow-up Information    Mikey Kirschner, MD Follow up.   Specialty:  Family Medicine Contact information: [place] [address] [phone]          No Known Allergies  Consultations:  Urology over the phone    Procedures/Studies: US Renal  Result Date: 02/05/2017 CLINICAL DATA:  Acute renal injury, dysuria. History of diabetes, BPH. EXAM: RENAL / URINARY TRACT ULTRASOUND COMPLETE COMPARISON:  None in PACs FINDINGS: Right Kidney: Length: 11.7 cm. There is mild hydronephrosis. The renal cortical echotexture remains lower than that of the liver. Left Kidney: Length: 11.8 cm. There is mild hydronephrosis. The renal cortical echotexture is similar to that on the right. Bladder: The urinary bladder is decompressed by Foley catheter. The urinary bladder wall is subjectively thickened. IMPRESSION: Mild bilateral hydronephrosis.  Normal renal cortical echotexture. Decompressed urinary bladder with a subjectively thickened wall. Electronically Signed   By: David  Martinique M.D.   On: 02/05/2017 09:29       Subjective: Patient feeling better, no abdominal pain, no nausea or vomiting, no hematuria.   Discharge Exam: Vitals:   02/05/17 2037 02/06/17 0352  BP: (!) 142/62 135/64  Pulse: 79 68  Resp: 16 16  Temp: 99 F (37.2 C) 98.8 F (37.1 C)   Vitals:    02/05/17 1400 02/05/17 2029 02/05/17 2037 02/06/17 0352  BP: (!) 141/94  (!) 142/62 135/64  Pulse: 73  79 68  Resp: 16  16 16   Temp: 98.4 F (36.9 C)  99 F (37.2 C) 98.8 F (37.1 C)  TempSrc:   Oral Oral  SpO2: 96% 94% 94% 96%  Weight:      Height:        General: Pt is alert, awake, not in acute distress E ENT. No pallor or icterus, oral mucosa moist.  Cardiovascular: RRR, S1/S2 +, no rubs, no gallops Respiratory: CTA bilaterally, no wheezing, no rhonchi Abdominal: Soft, NT, ND, bowel sounds + Extremities: no edema, no cyanosis Foley catheter in placed with clear urine on collecting bag.     The results of significant diagnostics from this hospitalization (including imaging, microbiology, ancillary and laboratory) are listed below for reference.     Microbiology: Recent Results (from the past 240 hour(s))  Urine Culture     Status: None   Collection Time: 02/04/17 12:00 AM  Result Value Ref Range Status   Urine Culture, Routine Final report  Final   Organism ID, Bacteria Comment  Final    Comment: Culture shows less than 10,000 colony forming units of bacteria per milliliter of urine. This colony count is not generally considered to be clinically significant.   Urine culture     Status: None   Collection Time: 02/04/17  7:46 PM  Result Value Ref Range Status   Specimen Description URINE, CLEAN CATCH  Final   Special Requests NONE  Final   Culture   Final    NO GROWTH Performed at [facility], [address] [place], [place] [address]    Report Status 02/06/2017 FINAL  Final  Blood culture (routine x 2)     Status: None (Preliminary result)   Collection Time: 02/04/17  8:02 PM  Result Value Ref Range Status   Specimen Description BLOOD LEFT ARM  Final   Special Requests   Final    BOTTLES DRAWN AEROBIC AND ANAEROBIC Blood Culture adequate volume   Culture NO GROWTH 2 DAYS  Final   Report Status PENDING  Incomplete  Blood culture (routine x 2)      Status: None (Preliminary result)   Collection Time: 02/04/17  8:05 PM  Result Value Ref Range Status   Specimen Description BLOOD LEFT ARM  Final   Special Requests   Final    BOTTLES DRAWN AEROBIC AND ANAEROBIC Blood Culture adequate volume   Culture NO GROWTH 2 DAYS  Final   Report Status PENDING  Incomplete     Labs: BNP (last 3 results) No results for input(s): BNP in the last 8760 hours. Basic Metabolic Panel:  Recent Labs Lab 02/04/17 1535 02/04/17 1830 02/05/17 0523 02/06/17 0514  NA 144 138 145 140  K 4.3 3.9 3.8 3.8  CL 110 107 113* 107  CO2 26 22 27 26   GLUCOSE 168* 268* 153* 144*  BUN 39* 39* 32* 24*  CREATININE 3.01* 2.99* 1.92* 1.28*  CALCIUM 8.8* 8.3* 8.0* 7.9*  MG  --   --   --  2.2   Liver Function Tests:  Recent Labs Lab 02/05/17 0523  AST 16  ALT 12*  ALKPHOS 41  BILITOT 1.4*  PROT 6.0*  ALBUMIN 3.3*   No results for input(s): LIPASE, AMYLASE in the last 168 hours. No results for input(s): AMMONIA in the last 168 hours. CBC:  Recent Labs Lab 02/04/17 1535 02/04/17 1830 02/05/17 0523  WBC 9.6 9.0 7.3  NEUTROABS 7.3  --   --   HGB 14.5 13.8 12.7*  HCT 42.1 39.9 37.0*  MCV 88.4 87.9 88.5  PLT 117* 114* 114*   Cardiac Enzymes: No results for input(s): CKTOTAL, CKMB, CKMBINDEX, TROPONINI in the last 168 hours. BNP: Invalid input(s): POCBNP CBG:  Recent Labs Lab 02/05/17 0834 02/05/17 1157 02/05/17 1710 02/05/17 2036 02/06/17 0747  GLUCAP 172* 118* 157* 161* 145*   D-Dimer No results for input(s): DDIMER in the last 72 hours. Hgb A1c  Recent Labs  02/04/17 2005  HGBA1C 6.2*   Lipid Profile No results for input(s): CHOL, HDL, LDLCALC, TRIG, CHOLHDL, LDLDIRECT in the last 72 hours. Thyroid function studies No results for input(s): TSH, T4TOTAL, T3FREE, THYROIDAB in the last 72 hours.  Invalid input(s): FREET3 Anemia work up No results for input(s): VITAMINB12, FOLATE, FERRITIN, TIBC, IRON, RETICCTPCT in the last 72  hours. Urinalysis    Component Value Date/Time   COLORURINE AMBER (A) 02/04/2017 1946   APPEARANCEUR CLEAR 02/04/2017 1946   LABSPEC 1.009 02/04/2017 1946   PHURINE 5.0 02/04/2017 1946   GLUCOSEU NEGATIVE 02/04/2017 1946   HGBUR SMALL (A) 02/04/2017 1946   BILIRUBINUR NEGATIVE 02/04/2017 1946   KETONESUR NEGATIVE 02/04/2017 1946   PROTEINUR NEGATIVE 02/04/2017 1946   NITRITE POSITIVE (A) 02/04/2017 1946   LEUKOCYTESUR NEGATIVE 02/04/2017 1946   Sepsis Labs Invalid input(s): PROCALCITONIN,  WBC,  [facility] Recent Results (from the past 240 hour(s))  Urine Culture     Status: None   Collection Time: 02/04/17 12:00 AM  Result Value Ref Range Status   Urine Culture, Routine Final report  Final   Organism ID, Bacteria Comment  Final    Comment: Culture shows less than 10,000 colony forming units of bacteria per milliliter of urine. This colony count is not generally considered to be clinically significant.   Urine culture     Status: None   Collection Time: 02/04/17  7:46 PM  Result Value Ref Range Status   Specimen Description URINE, CLEAN CATCH  Final   Special Requests NONE  Final   Culture   Final    NO GROWTH Performed at [facility], [address] [place], [place] [address]    Report Status 02/06/2017 FINAL  Final  Blood culture (routine x 2)     Status: None (Preliminary result)   Collection Time: 02/04/17  8:02 PM  Result Value Ref Range Status   Specimen Description BLOOD LEFT ARM  Final   Special Requests   Final    BOTTLES DRAWN AEROBIC AND ANAEROBIC Blood Culture adequate volume   Culture NO GROWTH 2 DAYS  Final   Report Status PENDING  Incomplete  Blood culture (routine x 2)     Status: None (Preliminary result)   Collection Time: 02/04/17  8:05 PM  Result Value Ref Range Status   Specimen Description BLOOD LEFT ARM  Final   Special Requests   Final    BOTTLES DRAWN AEROBIC AND ANAEROBIC Blood Culture adequate volume   Culture NO  GROWTH 2 DAYS  Final   Report Status PENDING  Incomplete     Time coordinating discharge: 45 minutes  SIGNED:   Tawni Millers, MD  [facility] 02/06/2017, 10:54 AM Pager   If 7PM-7AM, please contact night-coverage [email or web address] Password [email or web address]

## 2017-02-07 ENCOUNTER — Ambulatory Visit (INDEPENDENT_AMBULATORY_CARE_PROVIDER_SITE_OTHER): Payer: Medicare Other | Admitting: Family Medicine

## 2017-02-07 ENCOUNTER — Encounter: Payer: Self-pay | Admitting: Family Medicine

## 2017-02-07 VITALS — BP 148/84 | Temp 98.6°F | Ht 68.0 in | Wt 188.0 lb

## 2017-02-07 DIAGNOSIS — N401 Enlarged prostate with lower urinary tract symptoms: Secondary | ICD-10-CM | POA: Diagnosis not present

## 2017-02-07 DIAGNOSIS — N289 Disorder of kidney and ureter, unspecified: Secondary | ICD-10-CM | POA: Diagnosis not present

## 2017-02-07 DIAGNOSIS — R350 Frequency of micturition: Secondary | ICD-10-CM

## 2017-02-07 NOTE — Progress Notes (Addendum)
   Subjective:    Patient ID: Chad Martinez, male    DOB: 1934-10-11, 82 y.o.   MRN: [id number]  HPIFollow up hospitalization.  The patient was seen today as a follow-up visit from the hospitalization. Face-to-face evaluation. Lab work and hospitalization notes were reviewed with the patient and his wife. Also time was taken today to talk with his urologist Dr. Lawerance Bach concerning follow-up while the patient was present phone call was made in the presence of the patient. Patient was discharged late yesterday. He was instructed to follow-up with [place]. He comes in today. We went over all of his medication. Feet swelling today.  Denies PND denies orthopnea denies headache. No fever chills. Hospital records were reviewed in detail   Review of Systems Denies dysuria fever chills flank pain and hematuria vomiting diarrhea    Objective:   Physical Exam Lungs clear heart regular abdomen soft no guarding rebound or tenderness  Small amount of foot swelling. Probably related to inactivity more increased activity recommended     Assessment & Plan:  BPH Bladder outlet obstruction Acute renal failure recovering Repeat metabolic 7 mid part next week Referral to urology Dr. Lawerance Bach is his urologist I spoke with Dr. Rosana Hoes they will see him the week of August 13.  A follow-up phone call was made on Friday evening. At that time the patient wife phone message was left.

## 2017-02-09 LAB — CULTURE, BLOOD (ROUTINE X 2)
CULTURE: NO GROWTH
CULTURE: NO GROWTH
SPECIAL REQUESTS: ADEQUATE
Special Requests: ADEQUATE

## 2017-02-09 NOTE — Addendum Note (Signed)
Addended by: Sallee Lange A on: 02/09/2017 10:06 PM   Modules accepted: Level of Service

## 2017-02-10 ENCOUNTER — Other Ambulatory Visit: Payer: Self-pay | Admitting: *Deleted

## 2017-02-10 DIAGNOSIS — N320 Bladder-neck obstruction: Secondary | ICD-10-CM

## 2017-02-10 NOTE — Addendum Note (Signed)
Addended by: Carmelina Noun on: 02/10/2017 11:12 AM   Modules accepted: Orders

## 2017-02-11 DIAGNOSIS — N289 Disorder of kidney and ureter, unspecified: Secondary | ICD-10-CM | POA: Diagnosis not present

## 2017-02-11 DIAGNOSIS — R350 Frequency of micturition: Secondary | ICD-10-CM | POA: Diagnosis not present

## 2017-02-11 DIAGNOSIS — N401 Enlarged prostate with lower urinary tract symptoms: Secondary | ICD-10-CM | POA: Diagnosis not present

## 2017-02-12 ENCOUNTER — Telehealth: Payer: Self-pay | Admitting: Family Medicine

## 2017-02-12 LAB — BASIC METABOLIC PANEL
BUN/Creatinine Ratio: 14 (ref 10–24)
BUN: 16 mg/dL (ref 8–27)
CALCIUM: 9.2 mg/dL (ref 8.6–10.2)
CO2: 27 mmol/L (ref 20–29)
Chloride: 101 mmol/L (ref 96–106)
Creatinine, Ser: 1.17 mg/dL (ref 0.76–1.27)
GFR calc non Af Amer: 59 mL/min/{1.73_m2} — ABNORMAL LOW (ref >59)
GFR, EST AFRICAN AMERICAN: 68 mL/min/{1.73_m2} (ref >59)
Glucose: 138 mg/dL — ABNORMAL HIGH (ref 65–99)
Potassium: 4.9 mmol/L (ref 3.5–5.2)
Sodium: 143 mmol/L (ref 134–144)

## 2017-02-12 MED ORDER — SULFAMETHOXAZOLE-TRIMETHOPRIM 800-160 MG PO TABS: 1.0000 | ORAL_TABLET | Freq: Two times a day (BID) | ORAL | 0 refills | Status: DC

## 2017-02-12 NOTE — Telephone Encounter (Signed)
Saw Dr. Nicki Reaper last week, was admitted to hospital for UTI and prostate.  He got out of the hospital last Thursday and has a catheter still.  Wife said now has pus around where the catheter goes in.  Dr. Nicki Reaper had prescribed Bactrim and they filled the presciption but hospital had told them not to take it.  Does he need to be on a different antibiotic? Uses [facility]

## 2017-02-12 NOTE — Telephone Encounter (Signed)
Spoke with patient's wife and informed her per Dr.Scott Luking- With normal kidney functions he may use of bactrim 1 twice daily over the next 7 days. According to our records he does not have allergies to Bactrim. He should also have an appointment with the specialist. Patient's wife verbalized understanding and stated that his appointment is 02/21/17.

## 2017-02-12 NOTE — Telephone Encounter (Signed)
No fever

## 2017-02-12 NOTE — Telephone Encounter (Signed)
Actually now that he has normal kidney functions he may use of Bactrim 1 twice daily over the next 7 days. According to our records he does not have allergies to Bactrim. He should also have an appointment with the specialist please find out when that is

## 2017-02-21 DIAGNOSIS — R972 Elevated prostate specific antigen [PSA]: Secondary | ICD-10-CM | POA: Diagnosis not present

## 2017-02-21 DIAGNOSIS — R339 Retention of urine, unspecified: Secondary | ICD-10-CM | POA: Diagnosis not present

## 2017-02-21 DIAGNOSIS — N179 Acute kidney failure, unspecified: Secondary | ICD-10-CM | POA: Diagnosis not present

## 2017-02-21 DIAGNOSIS — N401 Enlarged prostate with lower urinary tract symptoms: Secondary | ICD-10-CM | POA: Diagnosis not present

## 2017-02-21 DIAGNOSIS — N138 Other obstructive and reflux uropathy: Secondary | ICD-10-CM | POA: Diagnosis not present

## 2017-02-24 DIAGNOSIS — R338 Other retention of urine: Secondary | ICD-10-CM | POA: Diagnosis not present

## 2017-02-24 DIAGNOSIS — R972 Elevated prostate specific antigen [PSA]: Secondary | ICD-10-CM | POA: Diagnosis not present

## 2017-02-24 DIAGNOSIS — R339 Retention of urine, unspecified: Secondary | ICD-10-CM | POA: Diagnosis not present

## 2017-02-24 DIAGNOSIS — N401 Enlarged prostate with lower urinary tract symptoms: Secondary | ICD-10-CM | POA: Diagnosis not present

## 2017-02-28 DIAGNOSIS — H338 Other retinal detachments: Secondary | ICD-10-CM | POA: Diagnosis not present

## 2017-02-28 DIAGNOSIS — Z961 Presence of intraocular lens: Secondary | ICD-10-CM | POA: Diagnosis not present

## 2017-02-28 LAB — HM DIABETES EYE EXAM

## 2017-03-05 ENCOUNTER — Ambulatory Visit (INDEPENDENT_AMBULATORY_CARE_PROVIDER_SITE_OTHER): Payer: Medicare Other | Admitting: *Deleted

## 2017-03-05 DIAGNOSIS — I441 Atrioventricular block, second degree: Secondary | ICD-10-CM | POA: Diagnosis not present

## 2017-03-06 NOTE — Progress Notes (Signed)
Remote pacemaker transmission.   

## 2017-03-12 ENCOUNTER — Ambulatory Visit (INDEPENDENT_AMBULATORY_CARE_PROVIDER_SITE_OTHER): Payer: Medicare Other | Admitting: Cardiovascular Disease

## 2017-03-12 ENCOUNTER — Encounter: Payer: Self-pay | Admitting: Cardiovascular Disease

## 2017-03-12 VITALS — BP 130/70 | HR 82 | Ht 68.0 in | Wt 186.0 lb

## 2017-03-12 DIAGNOSIS — I10 Essential (primary) hypertension: Secondary | ICD-10-CM | POA: Diagnosis not present

## 2017-03-12 DIAGNOSIS — E7800 Pure hypercholesterolemia, unspecified: Secondary | ICD-10-CM | POA: Diagnosis not present

## 2017-03-12 DIAGNOSIS — I2510 Atherosclerotic heart disease of native coronary artery without angina pectoris: Secondary | ICD-10-CM

## 2017-03-12 DIAGNOSIS — Z950 Presence of cardiac pacemaker: Secondary | ICD-10-CM | POA: Diagnosis not present

## 2017-03-12 DIAGNOSIS — E119 Type 2 diabetes mellitus without complications: Secondary | ICD-10-CM

## 2017-03-12 DIAGNOSIS — I441 Atrioventricular block, second degree: Secondary | ICD-10-CM | POA: Diagnosis not present

## 2017-03-12 DIAGNOSIS — R001 Bradycardia, unspecified: Secondary | ICD-10-CM

## 2017-03-12 MED ORDER — SIMVASTATIN 80 MG PO TABS: 80.0000 mg | ORAL_TABLET | Freq: Every day | ORAL | 3 refills | Status: DC

## 2017-03-12 MED ORDER — NITROGLYCERIN 0.4 MG SL SUBL: 0.4000 mg | SUBLINGUAL_TABLET | SUBLINGUAL | 3 refills | Status: DC | PRN

## 2017-03-12 MED ORDER — METOPROLOL TARTRATE 25 MG PO TABS: 25.0000 mg | ORAL_TABLET | Freq: Two times a day (BID) | ORAL | 3 refills | Status: DC

## 2017-03-12 NOTE — Progress Notes (Signed)
Cardiology Office Note    Date:  03/12/2017   ID:  Chad Martinez, DOB Mar 17, 1935, MRN [id number]  PCP:  Mikey Kirschner, MD  Cardiologist:  Quay Burow, M.D.; Sanda Klein, MD   Chief Complaint  Patient presents with  . Follow-up    History of Present Illness:  Chad Martinez is a 82 y.o. 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 He sees Dr. Gwenlyn Found for coronary artery disease with history of 6 vessel bypass surgery in 2008. He has normal left ventricular systolic function and had a normal nuclear study in 2017. He has treated hypertension and hyperlipidemia. He is preparing for his yearly departure to [place] where he spends the winter months.  He has recently problems with acute urinary retention and post renal acute renal insufficiency (peak creatinine 3.0, back down to 1.17) and needed repeated catheterizations. Finally, this seems to have settled down. He is able to urinate freely now. He is on an alpha blocker. It appears that the etiology is prostatic hypertrophy.  He last saw Dr. Gwenlyn Found in May. He has not had any problems with angina, dyspnea, syncope, palpitations, focal neurological events, edema, claudication or other cardiovascular complaints..  Interrogation of his pacemaker shows normal device function and anticipated generator longevity of 11.5 years. There is 36% atrial pacing and <1% ventricular pacing. He has not had any new episodes of paroxysmal atrial tachycardia in the last couple of years, although these appear to occur with some frequency in 2016. He is not pacemaker dependent.Marland Kitchen Heart rate histogram distribution is favorable.  Past Medical History:  Diagnosis Date  . Colon polyps   . Coronary artery disease    s/p CABG x 6  . Detached retina    Partial  . Elevated PSA   . Enlarged prostate   . Hyperlipidemia   . Hypertension   . Pneumothorax 12/10/2011   right rib fx after  fall from ladder  . Reflux   . Syncope    Permanent Pacemaker 12/11/2011 MDT Adapta    Past Surgical History:  Procedure Laterality Date  . BYPASS GRAFT  10/08   LIMA to LAD,SVG to 2nd & 3rd obtuse marginal CX,SVG to PDA,1st posterolateral and 2nd posterolateral RCA  . COLONOSCOPY N/A 01/25/2013   Procedure: COLONOSCOPY;  Surgeon: Daneil Dolin, MD;  Location: [facility];  Service: Endoscopy;  Laterality: N/A;  8:30 Am  . CORONARY ARTERY BYPASS GRAFT  04/19/2012  . EYE SURGERY  2004  . HEMORROIDECTOMY    . NM MYOVIEW LTD  11/05/2010   no ischemia  . PERMANENT PACEMAKER INSERTION  12/11/2011   MDT Adapta  . PERMANENT PACEMAKER INSERTION N/A 12/11/2011   Procedure: PERMANENT PACEMAKER INSERTION;  Surgeon: Sanda Klein, MD;  Location: [facility];  Service: Cardiovascular;  Laterality: N/A;  . SKIN GRAFT  2012  . US ECHOCARDIOGRAPHY  10/14/2007   mild mitral annular ca+,EF 55%,AOV mildly sclerotic    Current Medications: Outpatient Medications Prior to Visit  Medication Sig Dispense Refill  . aspirin EC 81 MG tablet Take 81 mg by mouth daily.    . tamsulosin (FLOMAX) 0.4 MG CAPS capsule Take 1 capsule (0.4 mg total) by mouth daily. 30 capsule 0  . metoprolol tartrate (LOPRESSOR) 25 MG tablet Take 1 tablet (25 mg total) by mouth 2 (two) times daily. 180 tablet 3  . nitroGLYCERIN (NITROSTAT) 0.4 MG SL tablet Place 0.4 mg under the tongue every  5 (five) minutes as needed for chest pain (x 3 doses).    . simvastatin (ZOCOR) 80 MG tablet Take 1 tablet (80 mg total) by mouth at bedtime. 90 tablet 3  . sulfamethoxazole-trimethoprim (BACTRIM DS,SEPTRA DS) 800-160 MG tablet Take 1 tablet by mouth 2 (two) times daily. (Patient not taking: Reported on 03/12/2017) 14 tablet 0   No facility-administered medications prior to visit.      Allergies:   Patient has no known allergies.   Social History   Social History  . Marital status: Married    Spouse name: N/A  . Number of children: N/A  .  Years of education: N/A   Social History Main Topics  . Smoking status: Former Smoker    Packs/day: 3.00    Years: 20.00    Quit date: 12/19/1974  . Smokeless tobacco: Never Used  . Alcohol use No  . Drug use: No  . Sexual activity: Not Asked   Other Topics Concern  . None   Social History Narrative  . None     Family History:  The patient's family history includes Lung cancer in his brother; Multiple sclerosis in his father; Pneumonia in his mother.   ROS:   Please see the history of present illness.    ROS All other systems reviewed and are negative.   PHYSICAL EXAM:   VS:  BP 130/70   Pulse 82   Ht 5\' 8"  (1.727 m)   Wt 186 lb (84.4 kg)   BMI 28.28 kg/m     General: Alert, oriented x3, no distress mildly overweight Head: no evidence of trauma, PERRL, EOMI, no exophtalmos or lid lag, no myxedema, no xanthelasma; normal ears, nose and oropharynx Neck: normal jugular venous pulsations and no hepatojugular reflux; brisk carotid pulses without delay and no carotid bruits Chest: clear to auscultation, no signs of consolidation by percussion or palpation, normal fremitus, symmetrical and full respiratory excursions. Left subclavian pacemaker site appears healthy Cardiovascular: normal position and quality of the apical impulse, regular rhythm, normal first and widely split second heart sounds, no murmurs, rubs or gallops Abdomen: no tenderness or distention, no masses by palpation, no abnormal pulsatility or arterial bruits, normal bowel sounds, no hepatosplenomegaly Extremities: no clubbing, cyanosis or edema; 2+ radial, ulnar and brachial pulses bilaterally; 2+ right femoral, posterior tibial and dorsalis pedis pulses; 2+ left femoral, posterior tibial and dorsalis pedis pulses; no subclavian or femoral bruits Neurological: grossly nonfocal Psych: euthymic mood, full affect  Wt Readings from Last 3 Encounters:  03/12/17 186 lb (84.4 kg)  02/07/17 188 lb (85.3 kg)  02/04/17  190 lb (86.2 kg)      Studies/Labs Reviewed:   EKG:  EKG is ordered today.  It shows atrial paced ventricular sensed rhythm with a right bundle branch block (old), QTC 443 ms  Recent Labs: 02/05/2017: ALT 12; Hemoglobin 12.7; Platelets 114 02/06/2017: Magnesium 2.2 02/11/2017: BUN 16; Creatinine, Ser 1.17; Potassium 4.9; Sodium 143   Lipid Panel    Component Value Date/Time   CHOL 96 (L) 10/08/2016 0803   TRIG 104 10/08/2016 0803   HDL 37 (L) 10/08/2016 0803   CHOLHDL 2.6 10/08/2016 0803   CHOLHDL 2.8 01/31/2014 0714   VLDL 24 01/31/2014 0714   LDLCALC 38 10/08/2016 0803     ASSESSMENT:    1. Mobitz type 2 second degree AV block   2. Pacemaker   3. Coronary artery disease involving native coronary artery of native heart without angina pectoris   4.  Essential hypertension   5. Hypercholesterolemia   6. Type 2 diabetes mellitus without complication, without long-term current use of insulin (HCC)      PLAN:  In order of problems listed above:  1. Second degree AV block: Seems to be an infrequent occurrence. Rarely needs ventricular pacing 2. PPM: Normal device function. 9 remote downloads every 3 months and yearly office visits 3. CAD s/p CABG: Asymptomatic, 2017 had normal nuclear stress test. 4. HTN: Well-controlled. I gave him paper prescriptions that he can take to [place]. 5. HLP: recent LDL cholesterol 38, on statin 6. DM: Excellent control, recent A1c 6.2% 7. Obstructive uropathy with post renal acute renal failure: Resolved    Medication Adjustments/Labs and Tests Ordered: Current medicines are reviewed at length with the patient today.  Concerns regarding medicines are outlined above.  Medication changes, Labs and Tests ordered today are listed in the Patient Instructions below. Patient Instructions  Dr Sallyanne Kuster recommends that you continue on your current medications as directed. Please refer to the Current Medication list given to you today.  Remote monitoring  is used to monitor your Pacemaker or ICD from home. This monitoring reduces the number of office visits required to check your device to one time per year. It allows [place] to keep an eye on the functioning of your device to ensure it is working properly. You are scheduled for a device check from home on Wednesday, November 28th, 2018. You may send your transmission at any time that day. If you have a wireless device, the transmission will be sent automatically. After your physician reviews your transmission, you will receive a notification with your next transmission date.  Dr Sallyanne Kuster recommends that you schedule a follow-up appointment in 12 months with a pacemaker check. You will receive a reminder letter in the mail two months in advance. If you don't receive a letter, please call our office to schedule the follow-up appointment.  If you need a refill on your cardiac medications before your next appointment, please call your pharmacy.    Signed, Sanda Klein, MD  03/12/2017 4:27 PM    [address], [place], [place]  [address] Phone: [phone]; Fax: [phone]

## 2017-03-12 NOTE — Patient Instructions (Signed)

## 2017-03-18 ENCOUNTER — Other Ambulatory Visit: Payer: Self-pay

## 2017-03-18 ENCOUNTER — Encounter: Payer: Self-pay | Admitting: Cardiology

## 2017-03-20 ENCOUNTER — Ambulatory Visit (INDEPENDENT_AMBULATORY_CARE_PROVIDER_SITE_OTHER): Payer: Medicare Other | Admitting: Otolaryngology

## 2017-03-20 DIAGNOSIS — H903 Sensorineural hearing loss, bilateral: Secondary | ICD-10-CM | POA: Diagnosis not present

## 2017-03-21 LAB — CUP PACEART REMOTE DEVICE CHECK
Battery Impedance: 231 Ohm
Battery Voltage: 2.79 V
Brady Statistic AS VS Percent: 64 %
Date Time Interrogation Session: 20180829110933
Implantable Lead Implant Date: 20130605
Implantable Lead Location: 753860
Lead Channel Impedance Value: 654 Ohm
Lead Channel Pacing Threshold Amplitude: 0.375 V
Lead Channel Pacing Threshold Amplitude: 0.625 V
Lead Channel Pacing Threshold Pulse Width: 0.4 ms
Lead Channel Setting Pacing Amplitude: 1.5 V
Lead Channel Setting Pacing Amplitude: 2 V
Lead Channel Setting Sensing Sensitivity: 2.8 mV
MDC IDC LEAD IMPLANT DT: 20130605
MDC IDC LEAD LOCATION: 753859
MDC IDC MSMT BATTERY REMAINING LONGEVITY: 139 mo
MDC IDC MSMT LEADCHNL RA IMPEDANCE VALUE: 467 Ohm
MDC IDC MSMT LEADCHNL RA PACING THRESHOLD PULSEWIDTH: 0.4 ms
MDC IDC PG IMPLANT DT: 20130605
MDC IDC SET LEADCHNL RV PACING PULSEWIDTH: 0.4 ms
MDC IDC STAT BRADY AP VP PERCENT: 0 %
MDC IDC STAT BRADY AP VS PERCENT: 36 %
MDC IDC STAT BRADY AS VP PERCENT: 0 %

## 2017-03-24 DIAGNOSIS — N312 Flaccid neuropathic bladder, not elsewhere classified: Secondary | ICD-10-CM | POA: Diagnosis not present

## 2017-03-24 DIAGNOSIS — R972 Elevated prostate specific antigen [PSA]: Secondary | ICD-10-CM | POA: Diagnosis not present

## 2017-03-24 DIAGNOSIS — R338 Other retention of urine: Secondary | ICD-10-CM | POA: Diagnosis not present

## 2017-03-24 DIAGNOSIS — N401 Enlarged prostate with lower urinary tract symptoms: Secondary | ICD-10-CM | POA: Diagnosis not present

## 2017-03-31 ENCOUNTER — Telehealth: Payer: Self-pay | Admitting: Family Medicine

## 2017-03-31 DIAGNOSIS — E119 Type 2 diabetes mellitus without complications: Secondary | ICD-10-CM

## 2017-03-31 DIAGNOSIS — E7800 Pure hypercholesterolemia, unspecified: Secondary | ICD-10-CM

## 2017-03-31 DIAGNOSIS — I10 Essential (primary) hypertension: Secondary | ICD-10-CM

## 2017-03-31 NOTE — Telephone Encounter (Signed)
Lip liv A1c cbc met 7

## 2017-03-31 NOTE — Telephone Encounter (Signed)
Blood work ordered in EPIC. Patient notified. 

## 2017-03-31 NOTE — Telephone Encounter (Signed)
Patient had Lipid, Liver, Met 7 10/2016 and Met 7, HgbA1c and CBc 02/04/17

## 2017-03-31 NOTE — Telephone Encounter (Signed)
Patient has an appointment on 04/03/17 with Dr. Richardson Landry.  He is requesting order for labs.

## 2017-04-01 DIAGNOSIS — E7800 Pure hypercholesterolemia, unspecified: Secondary | ICD-10-CM | POA: Diagnosis not present

## 2017-04-01 DIAGNOSIS — E119 Type 2 diabetes mellitus without complications: Secondary | ICD-10-CM | POA: Diagnosis not present

## 2017-04-01 DIAGNOSIS — I10 Essential (primary) hypertension: Secondary | ICD-10-CM | POA: Diagnosis not present

## 2017-04-01 LAB — CUP PACEART INCLINIC DEVICE CHECK
Battery Remaining Longevity: 136 mo
Battery Voltage: 2.79 V
Brady Statistic AS VP Percent: 0 %
Brady Statistic AS VS Percent: 64 %
Date Time Interrogation Session: 20180905143725
Implantable Lead Location: 753860
Implantable Pulse Generator Implant Date: 20130605
Lead Channel Impedance Value: 494 Ohm
Lead Channel Pacing Threshold Amplitude: 0.375 V
Lead Channel Pacing Threshold Amplitude: 0.625 V
Lead Channel Pacing Threshold Amplitude: 0.75 V
Lead Channel Pacing Threshold Pulse Width: 0.4 ms
Lead Channel Pacing Threshold Pulse Width: 0.4 ms
Lead Channel Sensing Intrinsic Amplitude: 4 mV
Lead Channel Setting Pacing Pulse Width: 0.4 ms
Lead Channel Setting Sensing Sensitivity: 2.8 mV
MDC IDC LEAD IMPLANT DT: 20130605
MDC IDC LEAD IMPLANT DT: 20130605
MDC IDC LEAD LOCATION: 753859
MDC IDC MSMT BATTERY IMPEDANCE: 255 Ohm
MDC IDC MSMT LEADCHNL RA PACING THRESHOLD PULSEWIDTH: 0.4 ms
MDC IDC MSMT LEADCHNL RV IMPEDANCE VALUE: 741 Ohm
MDC IDC MSMT LEADCHNL RV PACING THRESHOLD AMPLITUDE: 0.5 V
MDC IDC MSMT LEADCHNL RV PACING THRESHOLD PULSEWIDTH: 0.4 ms
MDC IDC MSMT LEADCHNL RV SENSING INTR AMPL: 11.2 mV
MDC IDC SET LEADCHNL RA PACING AMPLITUDE: 1.5 V
MDC IDC SET LEADCHNL RV PACING AMPLITUDE: 2 V
MDC IDC STAT BRADY AP VP PERCENT: 0 %
MDC IDC STAT BRADY AP VS PERCENT: 36 %

## 2017-04-02 LAB — CBC WITH DIFFERENTIAL/PLATELET
BASOS: 0 %
Basophils Absolute: 0 10*3/uL (ref 0.0–0.2)
EOS (ABSOLUTE): 0.1 10*3/uL (ref 0.0–0.4)
Eos: 2 %
Hematocrit: 44.8 % (ref 37.5–51.0)
Hemoglobin: 15.5 g/dL (ref 13.0–17.7)
IMMATURE GRANULOCYTES: 0 %
Immature Grans (Abs): 0 10*3/uL (ref 0.0–0.1)
LYMPHS ABS: 1.2 10*3/uL (ref 0.7–3.1)
Lymphs: 20 %
MCH: 30.5 pg (ref 26.6–33.0)
MCHC: 34.6 g/dL (ref 31.5–35.7)
MCV: 88 fL (ref 79–97)
MONOS ABS: 0.6 10*3/uL (ref 0.1–0.9)
Monocytes: 10 %
NEUTROS PCT: 68 %
Neutrophils Absolute: 4 10*3/uL (ref 1.4–7.0)
PLATELETS: 148 10*3/uL — AB (ref 150–379)
RBC: 5.09 x10E6/uL (ref 4.14–5.80)
RDW: 14.2 % (ref 12.3–15.4)
WBC: 5.9 10*3/uL (ref 3.4–10.8)

## 2017-04-02 LAB — HEPATIC FUNCTION PANEL
ALK PHOS: 55 IU/L (ref 39–117)
ALT: 15 IU/L (ref 0–44)
AST: 20 IU/L (ref 0–40)
Albumin: 4.7 g/dL (ref 3.5–4.7)
Bilirubin Total: 1.4 mg/dL — ABNORMAL HIGH (ref 0.0–1.2)
Bilirubin, Direct: 0.33 mg/dL (ref 0.00–0.40)
Total Protein: 6.9 g/dL (ref 6.0–8.5)

## 2017-04-02 LAB — BASIC METABOLIC PANEL
BUN / CREAT RATIO: 18 (ref 10–24)
BUN: 20 mg/dL (ref 8–27)
CALCIUM: 8.9 mg/dL (ref 8.6–10.2)
CHLORIDE: 103 mmol/L (ref 96–106)
CO2: 24 mmol/L (ref 20–29)
Creatinine, Ser: 1.11 mg/dL (ref 0.76–1.27)
GFR calc non Af Amer: 62 mL/min/{1.73_m2} (ref >59)
GFR, EST AFRICAN AMERICAN: 72 mL/min/{1.73_m2} (ref >59)
Glucose: 131 mg/dL — ABNORMAL HIGH (ref 65–99)
POTASSIUM: 4.3 mmol/L (ref 3.5–5.2)
SODIUM: 143 mmol/L (ref 134–144)

## 2017-04-02 LAB — LIPID PANEL
CHOLESTEROL TOTAL: 90 mg/dL — AB (ref 100–199)
Chol/HDL Ratio: 2.6 ratio (ref 0.0–5.0)
HDL: 35 mg/dL — AB (ref >39)
LDL Calculated: 34 mg/dL (ref 0–99)
Triglycerides: 103 mg/dL (ref 0–149)
VLDL CHOLESTEROL CAL: 21 mg/dL (ref 5–40)

## 2017-04-02 LAB — HEMOGLOBIN A1C
Est. average glucose Bld gHb Est-mCnc: 128 mg/dL
Hgb A1c MFr Bld: 6.1 % — ABNORMAL HIGH (ref 4.8–5.6)

## 2017-04-03 ENCOUNTER — Encounter: Payer: Self-pay | Admitting: Family Medicine

## 2017-04-03 ENCOUNTER — Ambulatory Visit (INDEPENDENT_AMBULATORY_CARE_PROVIDER_SITE_OTHER): Payer: Medicare Other | Admitting: Family Medicine

## 2017-04-03 VITALS — BP 138/80 | Ht 68.0 in | Wt 184.0 lb

## 2017-04-03 DIAGNOSIS — Z23 Encounter for immunization: Secondary | ICD-10-CM

## 2017-04-03 DIAGNOSIS — I10 Essential (primary) hypertension: Secondary | ICD-10-CM | POA: Diagnosis not present

## 2017-04-03 DIAGNOSIS — N289 Disorder of kidney and ureter, unspecified: Secondary | ICD-10-CM

## 2017-04-03 DIAGNOSIS — I2510 Atherosclerotic heart disease of native coronary artery without angina pectoris: Secondary | ICD-10-CM | POA: Diagnosis not present

## 2017-04-03 DIAGNOSIS — E119 Type 2 diabetes mellitus without complications: Secondary | ICD-10-CM

## 2017-04-03 DIAGNOSIS — R413 Other amnesia: Secondary | ICD-10-CM | POA: Diagnosis not present

## 2017-04-03 DIAGNOSIS — E7800 Pure hypercholesterolemia, unspecified: Secondary | ICD-10-CM

## 2017-04-03 NOTE — Progress Notes (Signed)
   Subjective:    Patient ID: Chad Martinez, male    DOB: 1935/06/27, 82 y.o.   MRN: [id number]  Diabetes  He presents for his follow-up diabetic visit. He has type 2 diabetes mellitus. Home blood sugar record trend: 150- 160. He does not see a podiatrist.Eye exam is current (aug 24th).  A1C done on bloodwork. 6.1.   Patient claims compliance with diabetes medication. No obvious side effects. Reports no substantial low sugar spells. Most numbers are generally in good range when checked fasting. Generally does not miss a dose of medication. Watching diabetic diet closely'  Blood pressure medicine and blood pressure levels reviewed today with patient. Compliant with blood pressure medicine. States does not miss a dose. No obvious side effects. Blood pressure generally good when checked elsewhere. Watching salt intake.   Patient continues to take lipid medication regularly. No obvious side effects from it. Generally does not miss a dose. Prior blood work results are reviewed with patient. Patient continues to work on fat intake in diet  Results for orders placed or performed in visit on 04/03/17  HM DIABETES EYE EXAM  Result Value Ref Range   HM Diabetic Eye Exam No Retinopathy No Retinopathy      Wife has concerns about excessive forgetfulness .   Flu vaccine.    Review of Systems No headache, no major weight loss or weight gain, no chest pain no back pain abdominal pain no change in bowel habits complete ROS otherwise negative     Objective:   Physical Exam  Alert and oriented, vitals reviewed and stable, NAD ENT-TM's and ext canals WNL bilat via otoscopic exam Soft palate, tonsils and post pharynx WNL via oropharyngeal exam Neck-symmetric, no masses; thyroid nonpalpable and nontender Pulmonary-no tachypnea or accessory muscle use; Clear without wheezes via auscultation Card--no abnrml murmurs, rhythm reg and rate WNL Carotid pulses symmetric, without bruits Feet C diabetic  foot exam  MMSE reveals a score of 24 out of 30      Assessment & Plan:  Impression 1 type 2 diabetes good control discussed maintain same meds  #2 hypertension good control discussed maintain same meds  #3 renal insufficiency clinically stable discussed  #4 hyperlipidemia status uncertain prior blood work reviewed  #5 progressive memory issues. Substantial family concern. Long discussion held. We'll hold off on dementia workup at this time. But we'll continue to follow MMSE. Did testing does reveal an issue withclarity of thought beyond justnormal short-term memory loss but not enough yet to warrant dementia workup. Discussed.  Greater than 50% of this 40 minute face to face visit was spent in counseling and discussion and coordination of care regarding the above diagnosis/diagnosies

## 2017-04-04 ENCOUNTER — Ambulatory Visit: Payer: Medicare Other | Admitting: Family Medicine

## 2017-04-11 ENCOUNTER — Telehealth: Payer: Self-pay | Admitting: Family Medicine

## 2017-04-11 ENCOUNTER — Telehealth: Payer: Self-pay

## 2017-04-11 NOTE — Telephone Encounter (Signed)
Patient wife called and states the pt has been doing self caths under the direction of [facility] Orthoptist and she has noticed what looks like pus coming out of his penis. She states she has an appointment with Dr. Rosana Hoes on Monday Apr 14, 2017, but wanted [place] to call in an antibx in for him. I asked that she please call Dr. Rosana Hoes office to inquire about what to do. I told her if they give her any trouble to please call us back.

## 2017-04-11 NOTE — Telephone Encounter (Signed)
Patient had Rx for test strips called in the other day, but the pharmacy is telling his spouse that they are waiting to hear back from our office before they can fill this.  She said that they are heading to [place] soon and need to have these strips filled.  Please advise.  [facility]

## 2017-04-14 DIAGNOSIS — R339 Retention of urine, unspecified: Secondary | ICD-10-CM | POA: Diagnosis not present

## 2017-04-21 DIAGNOSIS — N401 Enlarged prostate with lower urinary tract symptoms: Secondary | ICD-10-CM | POA: Diagnosis not present

## 2017-04-21 DIAGNOSIS — N312 Flaccid neuropathic bladder, not elsewhere classified: Secondary | ICD-10-CM | POA: Diagnosis not present

## 2017-04-21 DIAGNOSIS — R972 Elevated prostate specific antigen [PSA]: Secondary | ICD-10-CM | POA: Diagnosis not present

## 2017-04-21 DIAGNOSIS — R338 Other retention of urine: Secondary | ICD-10-CM | POA: Diagnosis not present

## 2017-06-04 ENCOUNTER — Ambulatory Visit (INDEPENDENT_AMBULATORY_CARE_PROVIDER_SITE_OTHER): Payer: Medicare Other | Admitting: *Deleted

## 2017-06-04 DIAGNOSIS — I441 Atrioventricular block, second degree: Secondary | ICD-10-CM

## 2017-06-04 NOTE — Progress Notes (Signed)
Remote pacemaker transmission.   

## 2017-06-06 ENCOUNTER — Encounter: Payer: Self-pay | Admitting: Cardiology

## 2017-06-09 LAB — CUP PACEART REMOTE DEVICE CHECK
Battery Impedance: 255 Ohm
Battery Remaining Longevity: 137 mo
Brady Statistic AP VP Percent: 0 %
Brady Statistic AP VS Percent: 25 %
Brady Statistic AS VP Percent: 0 %
Brady Statistic AS VS Percent: 75 %
Date Time Interrogation Session: 20181128134802
Implantable Lead Implant Date: 20130605
Implantable Lead Location: 753859
Implantable Lead Model: 5076
Lead Channel Pacing Threshold Amplitude: 0.625 V
Lead Channel Pacing Threshold Pulse Width: 0.4 ms
Lead Channel Sensing Intrinsic Amplitude: 2.8 mV
Lead Channel Sensing Intrinsic Amplitude: 8 mV
MDC IDC LEAD IMPLANT DT: 20130605
MDC IDC LEAD LOCATION: 753860
MDC IDC MSMT BATTERY VOLTAGE: 2.79 V
MDC IDC MSMT LEADCHNL RA IMPEDANCE VALUE: 474 Ohm
MDC IDC MSMT LEADCHNL RV IMPEDANCE VALUE: 743 Ohm
MDC IDC MSMT LEADCHNL RV PACING THRESHOLD AMPLITUDE: 0.5 V
MDC IDC MSMT LEADCHNL RV PACING THRESHOLD PULSEWIDTH: 0.4 ms
MDC IDC PG IMPLANT DT: 20130605
MDC IDC SET LEADCHNL RA PACING AMPLITUDE: 1.5 V
MDC IDC SET LEADCHNL RV PACING AMPLITUDE: 2 V
MDC IDC SET LEADCHNL RV PACING PULSEWIDTH: 0.4 ms
MDC IDC SET LEADCHNL RV SENSING SENSITIVITY: 2.8 mV

## 2017-07-19 DIAGNOSIS — N390 Urinary tract infection, site not specified: Secondary | ICD-10-CM | POA: Diagnosis not present

## 2017-07-25 DIAGNOSIS — N312 Flaccid neuropathic bladder, not elsewhere classified: Secondary | ICD-10-CM | POA: Diagnosis not present

## 2017-07-25 DIAGNOSIS — N401 Enlarged prostate with lower urinary tract symptoms: Secondary | ICD-10-CM | POA: Diagnosis not present

## 2017-07-25 DIAGNOSIS — R338 Other retention of urine: Secondary | ICD-10-CM | POA: Diagnosis not present

## 2017-07-25 DIAGNOSIS — N3020 Other chronic cystitis without hematuria: Secondary | ICD-10-CM | POA: Diagnosis not present

## 2017-07-25 DIAGNOSIS — R972 Elevated prostate specific antigen [PSA]: Secondary | ICD-10-CM | POA: Diagnosis not present

## 2017-07-25 DIAGNOSIS — N138 Other obstructive and reflux uropathy: Secondary | ICD-10-CM | POA: Diagnosis not present

## 2017-09-03 ENCOUNTER — Ambulatory Visit (INDEPENDENT_AMBULATORY_CARE_PROVIDER_SITE_OTHER): Payer: Medicare Other | Admitting: *Deleted

## 2017-09-03 DIAGNOSIS — I441 Atrioventricular block, second degree: Secondary | ICD-10-CM | POA: Diagnosis not present

## 2017-09-03 NOTE — Progress Notes (Signed)
Remote pacemaker transmission.   

## 2017-09-04 ENCOUNTER — Encounter: Payer: Self-pay | Admitting: Cardiology

## 2017-09-12 ENCOUNTER — Telehealth: Payer: Self-pay | Admitting: Family Medicine

## 2017-09-12 DIAGNOSIS — I10 Essential (primary) hypertension: Secondary | ICD-10-CM

## 2017-09-12 DIAGNOSIS — E119 Type 2 diabetes mellitus without complications: Secondary | ICD-10-CM

## 2017-09-12 DIAGNOSIS — Z79899 Other long term (current) drug therapy: Secondary | ICD-10-CM

## 2017-09-12 DIAGNOSIS — R5383 Other fatigue: Secondary | ICD-10-CM

## 2017-09-12 DIAGNOSIS — Z13220 Encounter for screening for lipoid disorders: Secondary | ICD-10-CM

## 2017-09-12 NOTE — Telephone Encounter (Signed)
Pt is aware ordered. 

## 2017-09-12 NOTE — Telephone Encounter (Signed)
Sure same plus urine pro test 

## 2017-09-12 NOTE — Telephone Encounter (Signed)
Patient had A1c,bmet,cbc,hepatic,lipid. 04/01/2017.

## 2017-09-12 NOTE — Telephone Encounter (Signed)
Pt has appt 10/23/17 with Dr. Steve for med check ° °Needs labs ordered ° °Please advise & call pt when ready  °

## 2017-09-20 LAB — CUP PACEART REMOTE DEVICE CHECK
Battery Remaining Longevity: 138 mo
Brady Statistic AP VS Percent: 22 %
Brady Statistic AS VS Percent: 78 %
Date Time Interrogation Session: 20190227115528
Implantable Lead Implant Date: 20130605
Implantable Lead Location: 753859
Implantable Lead Location: 753860
Implantable Lead Model: 5076
Lead Channel Impedance Value: 481 Ohm
Lead Channel Pacing Threshold Amplitude: 0.625 V
Lead Channel Setting Pacing Amplitude: 1.5 V
Lead Channel Setting Pacing Amplitude: 2 V
Lead Channel Setting Sensing Sensitivity: 2.8 mV
MDC IDC LEAD IMPLANT DT: 20130605
MDC IDC MSMT BATTERY IMPEDANCE: 255 Ohm
MDC IDC MSMT BATTERY VOLTAGE: 2.79 V
MDC IDC MSMT LEADCHNL RA PACING THRESHOLD PULSEWIDTH: 0.4 ms
MDC IDC MSMT LEADCHNL RV IMPEDANCE VALUE: 704 Ohm
MDC IDC MSMT LEADCHNL RV PACING THRESHOLD AMPLITUDE: 0.5 V
MDC IDC MSMT LEADCHNL RV PACING THRESHOLD PULSEWIDTH: 0.4 ms
MDC IDC PG IMPLANT DT: 20130605
MDC IDC SET LEADCHNL RV PACING PULSEWIDTH: 0.4 ms
MDC IDC STAT BRADY AP VP PERCENT: 0 %
MDC IDC STAT BRADY AS VP PERCENT: 0 %

## 2017-09-29 DIAGNOSIS — I10 Essential (primary) hypertension: Secondary | ICD-10-CM | POA: Diagnosis not present

## 2017-09-29 DIAGNOSIS — Z79899 Other long term (current) drug therapy: Secondary | ICD-10-CM | POA: Diagnosis not present

## 2017-09-29 DIAGNOSIS — Z13220 Encounter for screening for lipoid disorders: Secondary | ICD-10-CM | POA: Diagnosis not present

## 2017-09-29 DIAGNOSIS — E119 Type 2 diabetes mellitus without complications: Secondary | ICD-10-CM | POA: Diagnosis not present

## 2017-09-29 DIAGNOSIS — R5383 Other fatigue: Secondary | ICD-10-CM | POA: Diagnosis not present

## 2017-09-30 ENCOUNTER — Encounter: Payer: Self-pay | Admitting: Family Medicine

## 2017-09-30 ENCOUNTER — Ambulatory Visit (INDEPENDENT_AMBULATORY_CARE_PROVIDER_SITE_OTHER): Payer: Medicare Other | Admitting: Family Medicine

## 2017-09-30 VITALS — BP 122/82 | Ht 68.0 in | Wt 187.8 lb

## 2017-09-30 DIAGNOSIS — R413 Other amnesia: Secondary | ICD-10-CM

## 2017-09-30 DIAGNOSIS — R350 Frequency of micturition: Secondary | ICD-10-CM | POA: Diagnosis not present

## 2017-09-30 LAB — CBC WITH DIFFERENTIAL/PLATELET
BASOS ABS: 0 10*3/uL (ref 0.0–0.2)
Basos: 1 %
EOS (ABSOLUTE): 0.2 10*3/uL (ref 0.0–0.4)
Eos: 2 %
Hematocrit: 44.4 % (ref 37.5–51.0)
Hemoglobin: 14.8 g/dL (ref 13.0–17.7)
IMMATURE GRANS (ABS): 0 10*3/uL (ref 0.0–0.1)
Immature Granulocytes: 0 %
LYMPHS: 19 %
Lymphocytes Absolute: 1.5 10*3/uL (ref 0.7–3.1)
MCH: 29.6 pg (ref 26.6–33.0)
MCHC: 33.3 g/dL (ref 31.5–35.7)
MCV: 89 fL (ref 79–97)
Monocytes Absolute: 0.5 10*3/uL (ref 0.1–0.9)
Monocytes: 6 %
NEUTROS ABS: 5.9 10*3/uL (ref 1.4–7.0)
Neutrophils: 72 %
PLATELETS: 155 10*3/uL (ref 150–379)
RBC: 5 x10E6/uL (ref 4.14–5.80)
RDW: 13.8 % (ref 12.3–15.4)
WBC: 8.2 10*3/uL (ref 3.4–10.8)

## 2017-09-30 LAB — POCT URINALYSIS DIPSTICK
Blood, UA: 50
PH UA: 6 (ref 5.0–8.0)
Spec Grav, UA: <=1.005 — AB (ref 1.010–1.025)

## 2017-09-30 LAB — MICROALBUMIN / CREATININE URINE RATIO
Creatinine, Urine: 107 mg/dL
Microalb/Creat Ratio: 79.5 mg/g creat — ABNORMAL HIGH (ref 0.0–30.0)
Microalbumin, Urine: 85.1 ug/mL

## 2017-09-30 LAB — LIPID PANEL
CHOLESTEROL TOTAL: 98 mg/dL — AB (ref 100–199)
Chol/HDL Ratio: 2.5 ratio (ref 0.0–5.0)
HDL: 40 mg/dL (ref >39)
LDL CALC: 40 mg/dL (ref 0–99)
Triglycerides: 91 mg/dL (ref 0–149)
VLDL CHOLESTEROL CAL: 18 mg/dL (ref 5–40)

## 2017-09-30 LAB — BASIC METABOLIC PANEL
BUN/Creatinine Ratio: 18 (ref 10–24)
BUN: 22 mg/dL (ref 8–27)
CALCIUM: 9.3 mg/dL (ref 8.6–10.2)
CO2: 26 mmol/L (ref 20–29)
Chloride: 104 mmol/L (ref 96–106)
Creatinine, Ser: 1.23 mg/dL (ref 0.76–1.27)
GFR calc Af Amer: 64 mL/min/{1.73_m2} (ref >59)
GFR calc non Af Amer: 55 mL/min/{1.73_m2} — ABNORMAL LOW (ref >59)
Glucose: 147 mg/dL — ABNORMAL HIGH (ref 65–99)
POTASSIUM: 4.8 mmol/L (ref 3.5–5.2)
Sodium: 144 mmol/L (ref 134–144)

## 2017-09-30 LAB — HEPATIC FUNCTION PANEL
ALT: 18 IU/L (ref 0–44)
AST: 17 IU/L (ref 0–40)
Albumin: 4.3 g/dL (ref 3.5–4.7)
Alkaline Phosphatase: 57 IU/L (ref 39–117)
BILIRUBIN, DIRECT: 0.19 mg/dL (ref 0.00–0.40)
Bilirubin Total: 0.7 mg/dL (ref 0.0–1.2)
TOTAL PROTEIN: 6.5 g/dL (ref 6.0–8.5)

## 2017-09-30 LAB — HEMOGLOBIN A1C
Est. average glucose Bld gHb Est-mCnc: 134 mg/dL
HEMOGLOBIN A1C: 6.3 % — AB (ref 4.8–5.6)

## 2017-09-30 MED ORDER — CIPROFLOXACIN HCL 500 MG PO TABS: 500.0000 mg | ORAL_TABLET | Freq: Two times a day (BID) | ORAL | 0 refills | Status: DC

## 2017-09-30 NOTE — Progress Notes (Signed)
   Subjective:    Patient ID: Chad Martinez, male    DOB: 26-Jan-1935, 83 y.o.   MRN: [id number]  HPI Patient arrives with issues with his memory.  22 out of 30  having worsening trouble, can no longer play bingo  Family wonders if dpressed    and also states not particularly concerned loss of memory no known family history of dementia.  About patient claims not depressed  Frequent urination started yesterday.   Results for orders placed or performed in visit on 09/30/17  POCT urinalysis dipstick  Result Value Ref Range   Color, UA     Clarity, UA     Glucose, UA     Bilirubin, UA     Ketones, UA     Spec Grav, UA <=1.005 (A) 1.010 - 1.025   Blood, UA 50    pH, UA 6.0 5.0 - 8.0   Protein, UA     Urobilinogen, UA  0.2 or 1.0 E.U./dL   Nitrite, UA +    Leukocytes, UA Large (3+) (A) Negative   Appearance     Odor      no family history of dementia as far as family knows.  Memory has started to slide considerably.  Family concerned.  They are wondering if it may be depression.  Patient states not depressed  Review of Systems No headache, no major weight loss or weight gain, no chest pain no back pain abdominal pain no change in bowel habits complete ROS otherwise negative     Objective:   Physical Exam Alert and oriented, vitals reviewed and stable, NAD ENT-TM's and ext canals WNL bilat via otoscopic exam Soft palate, tonsils and post pharynx WNL via oropharyngeal exam Neck-symmetric, no masses; thyroid nonpalpable and nontender Pulmonary-no tachypnea or accessory muscle use; Clear without wheezes via auscultation Card--no abnrml murmurs, rhythm reg and rate WNL Carotid pulses symmetric, without bruits No focal neurological deficits   MMSE 22 out of 30      Assessment & Plan:  Impression dementia.  Discussed.  Now 22 out of 30 on MMSE.  24 out of 30 back last summer.  Time to press on with the dementia workup.  Patient no longer driving.  Patient spouse obviously  concerned.  Appropriate blood work and scan follow-up in several weeks for discussion  Greater than 50% of this 25 minute face to face visit was spent in counseling and discussion and coordination of care regarding the above diagnosis/diagnosies  Also given cipro for uti

## 2017-10-01 LAB — TSH: TSH: 2.16 u[IU]/mL (ref 0.450–4.500)

## 2017-10-01 LAB — FOLATE: FOLATE: 11.4 ng/mL (ref >3.0)

## 2017-10-01 LAB — VITAMIN B12: Vitamin B-12: 347 pg/mL (ref 232–1245)

## 2017-10-02 LAB — URINE CULTURE

## 2017-10-02 LAB — SPECIMEN STATUS REPORT

## 2017-10-07 ENCOUNTER — Ambulatory Visit (HOSPITAL_COMMUNITY)
Admission: RE | Admit: 2017-10-07 | Discharge: 2017-10-07 | Disposition: A | Discharge status: Home / Self Care | Payer: Medicare Other | Source: Ambulatory Visit | Attending: Family Medicine | Admitting: Family Medicine

## 2017-10-07 DIAGNOSIS — R413 Other amnesia: Secondary | ICD-10-CM | POA: Diagnosis not present

## 2017-10-07 DIAGNOSIS — I6782 Cerebral ischemia: Secondary | ICD-10-CM | POA: Insufficient documentation

## 2017-10-07 DIAGNOSIS — G319 Degenerative disease of nervous system, unspecified: Secondary | ICD-10-CM | POA: Insufficient documentation

## 2017-10-23 ENCOUNTER — Ambulatory Visit (INDEPENDENT_AMBULATORY_CARE_PROVIDER_SITE_OTHER): Payer: Medicare Other | Admitting: Family Medicine

## 2017-10-23 ENCOUNTER — Encounter: Payer: Self-pay | Admitting: Family Medicine

## 2017-10-23 VITALS — BP 132/80 | Ht 68.0 in | Wt 187.0 lb

## 2017-10-23 DIAGNOSIS — E119 Type 2 diabetes mellitus without complications: Secondary | ICD-10-CM

## 2017-10-23 DIAGNOSIS — I10 Essential (primary) hypertension: Secondary | ICD-10-CM | POA: Diagnosis not present

## 2017-10-23 DIAGNOSIS — F0390 Unspecified dementia without behavioral disturbance: Secondary | ICD-10-CM | POA: Diagnosis not present

## 2017-10-23 DIAGNOSIS — E7800 Pure hypercholesterolemia, unspecified: Secondary | ICD-10-CM | POA: Diagnosis not present

## 2017-10-23 NOTE — Progress Notes (Signed)
Subjective:    Patient ID: Chad Martinez, male    DOB: 15-Oct-1934, 83 y.o.   MRN: [id number] Patient arrives office for follow-up of numerous chronic concerns along with a substantial discussion regarding recent work-up for progressive memory loss HPI  Patient is here today to follow up on Chronic illnesses  being Dm, Htn,hyperlipidemia. Results for orders placed or performed in visit on 09/30/17  Urine Culture  Result Value Ref Range   Urine Culture, Routine Final report (A)    Organism ID, Bacteria Staphylococcus aureus (A)    Antimicrobial Susceptibility Comment   TSH  Result Value Ref Range   TSH 2.160 0.450 - 4.500 uIU/mL  Folate  Result Value Ref Range   Folate 11.4 >3.0 ng/mL  Vitamin B12  Result Value Ref Range   Vitamin B-12 347 232 - 1,245 pg/mL  Specimen status report  Result Value Ref Range   specimen status report Comment   POCT urinalysis dipstick  Result Value Ref Range   Color, UA     Clarity, UA     Glucose, UA     Bilirubin, UA     Ketones, UA     Spec Grav, UA <=1.005 (A) 1.010 - 1.025   Blood, UA 50    pH, UA 6.0 5.0 - 8.0   Protein, UA     Urobilinogen, UA  0.2 or 1.0 E.U./dL   Nitrite, UA +    Leukocytes, UA Large (3+) (A) Negative   Appearance     Odor     Patient claims compliance with diabetes medication. No obvious side effects. Reports no substantial low sugar spells. Most numbers are generally in good range when checked fasting. Generally does not miss a dose of medication. Watching diabetic diet closely  Patient continues to take lipid medication regularly. No obvious side effects from it. Generally does not miss a dose. Prior blood work results are reviewed with patient. Patient continues to work on fat intake in diet  Please see prior notes.  Patient having unfortunately substantial progressive memory loss.  Last MMSE was 22 out of 30.  See CT scan results.  This reveals chronic atrophy consistent with age.  No focal deficit.  Please see  appropriate blood work also.  All within normal limits Review of Systems No headache, no major weight loss or weight gain, no chest pain no back pain abdominal pain no change in bowel habits complete ROS otherwise negative     Objective:   Physical Exam  Alert and oriented, vitals reviewed and stable, NAD ENT-TM's and ext canals WNL bilat via otoscopic exam Soft palate, tonsils and post pharynx WNL via oropharyngeal exam Neck-symmetric, no masses; thyroid nonpalpable and nontender Pulmonary-no tachypnea or accessory muscle use; Clear without wheezes via auscultation Card--no abnrml murmurs, rhythm reg and rate WNL Carotid pulses symmetric, without bruits  Patient pleasant responsive no acute distress today     Assessment & Plan:  Impression mild dementia.  For the first time today we used the word Alzheimer's disease.  I think this most likely reflects what is going on.  Shared with patient and spouse.  Many questions answered.  I advised family I always like to see a neurologist see the patient early in the diagnosis to be 100% sure they automatically, and altered by guidance regarding any potential therapy.  2.  Type 2 diabetes good control discussed to maintain same meds  3.  Hyperlipidemia.  Controlled decent discussed to maintain same dose meds refilled  4.  Hypertension.  Clinically stable to maintain same  5.  Chronic kidney disease.  Stable fortunately  Neurology referral as noted.  Chronic medications refilled.  Follow-up in several months.  Diet exercise discussed.  Greater than 50% of this 40  minute face to face visit was spent in counseling and discussion and coordination of care regarding the above diagnosis/diagnosies

## 2017-10-28 ENCOUNTER — Encounter: Payer: Self-pay | Admitting: Neurology

## 2017-10-29 ENCOUNTER — Encounter: Payer: Self-pay | Admitting: Family Medicine

## 2017-11-18 ENCOUNTER — Ambulatory Visit: Payer: Medicare Other | Admitting: Cardiovascular Disease

## 2017-11-19 ENCOUNTER — Ambulatory Visit (INDEPENDENT_AMBULATORY_CARE_PROVIDER_SITE_OTHER): Payer: Medicare Other | Admitting: Cardiovascular Disease

## 2017-11-19 ENCOUNTER — Encounter: Payer: Self-pay | Admitting: Cardiovascular Disease

## 2017-11-19 VITALS — BP 136/88 | HR 84 | Ht 67.0 in | Wt 190.2 lb

## 2017-11-19 DIAGNOSIS — Z950 Presence of cardiac pacemaker: Secondary | ICD-10-CM

## 2017-11-19 DIAGNOSIS — I10 Essential (primary) hypertension: Secondary | ICD-10-CM

## 2017-11-19 DIAGNOSIS — E7800 Pure hypercholesterolemia, unspecified: Secondary | ICD-10-CM | POA: Diagnosis not present

## 2017-11-19 DIAGNOSIS — Z951 Presence of aortocoronary bypass graft: Secondary | ICD-10-CM

## 2017-11-19 NOTE — Assessment & Plan Note (Signed)
History of essential hypertension with blood pressure measured today at 136/88.  He is on metoprolol.  Continue current meds at current dosing.

## 2017-11-19 NOTE — Assessment & Plan Note (Signed)
History of hyperlipidemia on statin therapy with recent lipid profile performed 09/29/2017 revealing an LDL 40 and HDL of 40.

## 2017-11-19 NOTE — Patient Instructions (Signed)

## 2017-11-19 NOTE — Assessment & Plan Note (Signed)
History of coronary artery disease status post bypass grafting times 610/13/08 by Dr. Arvid Right his last Myoview performed 10/02/2012 was nonischemic.  He denies chest pain.

## 2017-11-19 NOTE — Progress Notes (Signed)
11/19/2017 Chad Martinez   1935/06/28  [id number]  Primary Physician Chad Kirschner, MD Primary Cardiologist: Chad Harp MD Chad Martinez, [name]  HPI:  Chad Martinez is a 83 y.o.  mildly overweight married Caucasian male, father of 35, who is accompanied by his wife today. I last saw him in the office  11/13/2016. He has a history of CAD, status post coronary artery bypass grafting x6, April 20, 2007, by Dr. Gilford Martinez. His other problems include remote tobacco abuse and hyperlipidemia. He is asymptomatic. He vacations 6 months of the year in [place], [place], near [place]. He had an episode of syncope with high-grade second-degree AV block requiring dual-chamber pacemaker implantation by Dr. Sanda Martinez with a [employer] Adapta device. Dr. Sallyanne Martinez last saw him in the office in June of last year. The patient has remote interrogation via [facility] and is scheduled to see Dr. Sallyanne Martinez back in the office this coming August . He did have a Myoview tress test performed 12/20/68 which is low risk. Recent blood work performed by his PCP 09/29/2017 revealed an LDL 40 and HDL 40.  Since I saw him a year ago he is remained stable.  He has developed a little bit of dementia as well as some urinary retention for which she sees Dr. Lawerance Martinez at [facility].      Current Meds  Medication Sig  . aspirin EC 81 MG tablet Take 81 mg by mouth daily.  . finasteride (PROSCAR) 5 MG tablet Take 1 tablet by mouth daily.  . metoprolol tartrate (LOPRESSOR) 25 MG tablet Take 1 tablet (25 mg total) by mouth 2 (two) times daily.  . nitroGLYCERIN (NITROSTAT) 0.4 MG SL tablet Place 1 tablet (0.4 mg total) under the tongue every 5 (five) minutes as needed for chest pain (x 3 doses).  . simvastatin (ZOCOR) 80 MG tablet Take 1 tablet (80 mg total) by mouth at bedtime.  . tamsulosin (FLOMAX) 0.4 MG CAPS capsule Take 1 capsule (0.4 mg total) by mouth daily.     No Known  Allergies  Social History   Socioeconomic History  . Marital status: Married    Spouse name: Not on file  . Number of children: Not on file  . Years of education: Not on file  . Highest education level: Not on file  Occupational History  . Not on file  Social Needs  . Financial resource strain: Not on file  . Food insecurity:    Worry: Not on file    Inability: Not on file  . Transportation needs:    Medical: Not on file    Non-medical: Not on file  Tobacco Use  . Smoking status: Former Smoker    Packs/day: 3.00    Years: 20.00    Pack years: 60.00    Last attempt to quit: 12/19/1974    Years since quitting: 42.9  . Smokeless tobacco: Never Used  Substance and Sexual Activity  . Alcohol use: No  . Drug use: No  . Sexual activity: Not on file  Lifestyle  . Physical activity:    Days per week: Not on file    Minutes per session: Not on file  . Stress: Not on file  Relationships  . Social connections:    Talks on phone: Not on file    Gets together: Not on file    Attends religious service: Not on file    Active member of club or  organization: Not on file    Attends meetings of clubs or organizations: Not on file    Relationship status: Not on file  . Intimate partner violence:    Fear of current or ex partner: Not on file    Emotionally abused: Not on file    Physically abused: Not on file    Forced sexual activity: Not on file  Other Topics Concern  . Not on file  Social History Narrative  . Not on file     Review of Systems: General: negative for chills, fever, night sweats or weight changes.  Cardiovascular: negative for chest pain, dyspnea on exertion, edema, orthopnea, palpitations, paroxysmal nocturnal dyspnea or shortness of breath Dermatological: negative for rash Respiratory: negative for cough or wheezing Urologic: negative for hematuria Abdominal: negative for nausea, vomiting, diarrhea, bright red blood per rectum, melena, or  hematemesis Neurologic: negative for visual changes, syncope, or dizziness All other systems reviewed and are otherwise negative except as noted above.    Blood pressure 136/88, pulse 84, height 5\' 7"  (1.702 m), weight 190 lb 3.2 oz (86.3 kg).  General appearance: alert and no distress Neck: no adenopathy, no carotid bruit, no JVD, supple, symmetrical, trachea midline and thyroid not enlarged, symmetric, no tenderness/mass/nodules Lungs: clear to auscultation bilaterally Heart: regular rate and rhythm, S1, S2 normal, no murmur, click, rub or gallop Extremities: extremities normal, atraumatic, no cyanosis or edema Pulses: 2+ and symmetric Skin: Skin color, texture, turgor normal. No rashes or lesions Neurologic: Alert and oriented X 3, normal strength and tone. Normal symmetric reflexes. Normal coordination and gait  EKG normal sinus rhythm at 84 with Martinez bundle branch block.  I personally reviewed this EKG.  ASSESSMENT AND PLAN:   HTN (hypertension) History of essential hypertension with blood pressure measured today at 136/88.  He is on metoprolol.  Continue current meds at current dosing.  Hyperlipemia History of hyperlipidemia on statin therapy with recent lipid profile performed 09/29/2017 revealing an LDL 40 and HDL of 40.  Pacemaker History of permanent transvenous pacemaker insertion team followed by Dr. Sallyanne Martinez for high-grade second-degree AV block and syncope.  Hx of CABG x 6 2008 History of coronary artery disease status post bypass grafting times 610/13/08 by Dr. Arvid Martinez his last Myoview performed 10/02/2012 was nonischemic.  He denies chest pain.      Chad Harp MD FACP,FACC,FAHA, [facility] 11/19/2017 4:18 PM

## 2017-11-19 NOTE — Assessment & Plan Note (Signed)
History of permanent transvenous pacemaker insertion team followed by Dr. Sallyanne Kuster for high-grade second-degree AV block and syncope.

## 2017-11-20 NOTE — Addendum Note (Signed)
Addended by: Zebedee Iba on: 11/20/2017 04:34 PM   Modules accepted: Orders

## 2017-11-26 ENCOUNTER — Ambulatory Visit (INDEPENDENT_AMBULATORY_CARE_PROVIDER_SITE_OTHER): Payer: Medicare Other | Admitting: Neurology

## 2017-11-26 ENCOUNTER — Other Ambulatory Visit: Payer: Self-pay

## 2017-11-26 ENCOUNTER — Encounter: Payer: Self-pay | Admitting: Neurology

## 2017-11-26 VITALS — BP 168/80 | HR 76 | Ht 67.0 in | Wt 186.0 lb

## 2017-11-26 DIAGNOSIS — F0390 Unspecified dementia without behavioral disturbance: Secondary | ICD-10-CM

## 2017-11-26 DIAGNOSIS — F03A0 Unspecified dementia, mild, without behavioral disturbance, psychotic disturbance, mood disturbance, and anxiety: Secondary | ICD-10-CM

## 2017-11-26 MED ORDER — ESCITALOPRAM OXALATE 10 MG PO TABS: 10.0000 mg | ORAL_TABLET | Freq: Every day | ORAL | 11 refills | Status: DC

## 2017-11-26 NOTE — Progress Notes (Signed)
NEUROLOGY CONSULTATION NOTE  Chad Martinez MRN: [id number] DOB: 1935/02/16  Referring provider: Dr. Baltazar Apo Primary care provider: Dr. Baltazar Apo  Reason for consult:  dementia  Dear Dr Wolfgang Phoenix:  Thank you for your kind referral of Chad Martinez for consultation of the above symptoms. Although his history is well known to you, please allow me to reiterate it for the purpose of our medical record. The patient was accompanied to the clinic by his wife and daughter who also provide collateral information. Records and images were personally reviewed where available.  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 He feels his memory has been getting worse and going downhill over the past year. Family started noticing changes around 1-1.5 years ago, he was initially leaving doors and drawers open, or leave the water running. Now he forgets to flush the toilet. His daughter first noticed that he was more quiet. He has always been very jovial and talks to everybody, but now he does not talk as much. He stopped driving in April 4132 after he went the wrong way and got confused, they attribute this to his vision. His wife has always been in charge of finances and medications, but now has to remind him to take his medications because he would get confused about AM/PM medications. He likes to play Bingo but recently has had difficulty. His daughter also noticed that he would have slower processing/understanding when she talks to him. He follows instructions fine. There have been other behavioral changes as well, he is more anxious, pacing around the house. One time he got up at 2AM and showered, then the next morning recalled he took a shower and did not know why he did it. Over the past month he has had visual hallucinations where he would  see people in the woods behind their house. No auditory component. He is able to dress and bathe independently. He had an MMSE at PCP office in March 2019 which was 22/30 (24/30 in the summer).  His wife has noticed the past week that he seems to be slurring his words, his daughter describes it as difficulty finding the right word. He had urinary issues last July with urinary retention requiring self-catheterization, this has improved but since March he has had urinary incontinence and wears pull ups at night and when going out. He denies any headaches, dizziness, diplopia, dysphagia, neck/back pain, focal numbness/tingling/weakness, bowel dysfunction, anosmia, or tremors. He fell from their house 5 years ago and was found to have AV block with pacemaker placed. No family history of dementia. No history of significant head injuries or alcohol intake.   I personally reviewed head CT without contrast done 10/07/17 which did not show any acute changes, there was mild diffuse atrophy and chronic microvascular disease.   Laboratory Data: Lab Results  Component Value Date   TSH 2.160 09/30/2017   Lab Results  Component Value Date   [id number] 09/30/2017      PAST MEDICAL HISTORY: Past Medical History:  Diagnosis Date  . Colon polyps   . Coronary artery disease    s/p CABG x 6  . Detached retina    Partial  . Elevated PSA   . Enlarged prostate   . Hyperlipidemia   . Hypertension   . Pneumothorax 12/10/2011   right rib fx after fall  from ladder  . Reflux   . Syncope    Permanent Pacemaker 12/11/2011 MDT Adapta    PAST SURGICAL HISTORY: Past Surgical History:  Procedure Laterality Date  . BYPASS GRAFT  10/08   LIMA to LAD,SVG to 2nd & 3rd obtuse marginal CX,SVG to PDA,1st posterolateral and 2nd posterolateral RCA  . COLONOSCOPY N/A 01/25/2013   Procedure: COLONOSCOPY;  Surgeon: Daneil Dolin, MD;  Location: [facility];  Service: Endoscopy;  Laterality: N/A;  8:30 Am  . CORONARY  ARTERY BYPASS GRAFT  04/19/2012  . EYE SURGERY  2004  . HEMORROIDECTOMY    . NM MYOVIEW LTD  11/05/2010   no ischemia  . PERMANENT PACEMAKER INSERTION  12/11/2011   MDT Adapta  . PERMANENT PACEMAKER INSERTION N/A 12/11/2011   Procedure: PERMANENT PACEMAKER INSERTION;  Surgeon: Sanda Klein, MD;  Location: [facility]ATH LAB;  Service: Cardiovascular;  Laterality: N/A;  . SKIN GRAFT  2012  . US ECHOCARDIOGRAPHY  10/14/2007   mild mitral annular ca+,EF 55%,AOV mildly sclerotic    MEDICATIONS: Current Outpatient Medications on File Prior to Visit  Medication Sig Dispense Refill  . aspirin EC 81 MG tablet Take 81 mg by mouth daily.    . finasteride (PROSCAR) 5 MG tablet Take 1 tablet by mouth daily.    . metoprolol tartrate (LOPRESSOR) 25 MG tablet Take 1 tablet (25 mg total) by mouth 2 (two) times daily. 180 tablet 3  . nitroGLYCERIN (NITROSTAT) 0.4 MG SL tablet Place 1 tablet (0.4 mg total) under the tongue every 5 (five) minutes as needed for chest pain (x 3 doses). 25 tablet 3  . simvastatin (ZOCOR) 80 MG tablet Take 1 tablet (80 mg total) by mouth at bedtime. 90 tablet 3  . tamsulosin (FLOMAX) 0.4 MG CAPS capsule Take 1 capsule (0.4 mg total) by mouth daily. 30 capsule 0   No current facility-administered medications on file prior to visit.     ALLERGIES: No Known Allergies  FAMILY HISTORY: Family History  Problem Relation Age of Onset  . Pneumonia Mother   . Multiple sclerosis Father   . Lung cancer Brother     SOCIAL HISTORY: Social History   Socioeconomic History  . Marital status: Married    Spouse name: Not on file  . Number of children: Not on file  . Years of education: Not on file  . Highest education level: Not on file  Occupational History  . Not on file  Social Needs  . Financial resource strain: Not on file  . Food insecurity:    Worry: Not on file    Inability: Not on file  . Transportation needs:    Medical: Not on file    Non-medical: Not on file  Tobacco  Use  . Smoking status: Former Smoker    Packs/day: 3.00    Years: 20.00    Pack years: 60.00    Last attempt to quit: 12/19/1974    Years since quitting: 42.9  . Smokeless tobacco: Never Used  Substance and Sexual Activity  . Alcohol use: No  . Drug use: No  . Sexual activity: Not on file  Lifestyle  . Physical activity:    Days per week: Not on file    Minutes per session: Not on file  . Stress: Not on file  Relationships  . Social connections:    Talks on phone: Not on file    Gets together: Not on file    Attends religious service: Not on file  Active member of club or organization: Not on file    Attends meetings of clubs or organizations: Not on file    Relationship status: Not on file  . Intimate partner violence:    Fear of current or ex partner: Not on file    Emotionally abused: Not on file    Physically abused: Not on file    Forced sexual activity: Not on file  Other Topics Concern  . Not on file  Social History Narrative  . Not on file    REVIEW OF SYSTEMS: Constitutional: No fevers, chills, or sweats, no generalized fatigue, change in appetite Eyes: No visual changes, double vision, eye pain Ear, nose and throat: No hearing loss, ear pain, nasal congestion, sore throat Cardiovascular: No chest pain, palpitations Respiratory:  No shortness of breath at rest or with exertion, wheezes GastrointestinaI: No nausea, vomiting, diarrhea, abdominal pain, fecal incontinence Genitourinary:  No dysuria, urinary retention or frequency Musculoskeletal:  No neck pain, back pain Integumentary: No rash, pruritus, skin lesions Neurological: as above Psychiatric: No depression, insomnia, anxiety Endocrine: No palpitations, fatigue, diaphoresis, mood swings, change in appetite, change in weight, increased thirst Hematologic/Lymphatic:  No anemia, purpura, petechiae. Allergic/Immunologic: no itchy/runny eyes, nasal congestion, recent allergic reactions, rashes  PHYSICAL  EXAM: Vitals:   11/26/17 1022  BP: (!) 168/80  Pulse: 76  SpO2: 94%   General: No acute distress, flat affect Head:  Normocephalic/atraumatic Eyes: Fundoscopic exam shows bilateral sharp discs, no vessel changes, exudates, or hemorrhages Neck: supple, no paraspinal tenderness, full range of motion Back: No paraspinal tenderness Heart: regular rate and rhythm Lungs: Clear to auscultation bilaterally. Vascular: No carotid bruits. Skin/Extremities: No rash, no edema Neurological Exam: Mental status: alert and oriented to person, place, and time, no dysarthria or aphasia, Fund of knowledge is appropriate.  Recent and remote memory are impaired.  Attention and concentration are reduced.  Able to name objects and repeat phrases. Able to name 9 words starting with F in 1 minute (nl > 11). CDT 4/5 MMSE - Mini Mental State Exam 11/26/2017  Orientation to time 4  Orientation to Place 5  Registration 3  Attention/ Calculation 3  Recall 1  Language- name 2 objects 2  Language- repeat 1  Language- follow 3 step command 3  Language- read & follow direction 1  Write a sentence 1  Copy design 1  Total score 25   Cranial nerves: CN I: not tested CN II: left pupil irregular, right pupil reactive to light, decreased vision on left eye able to count fingers, visual fields intact, fundi unremarkable. CN III, IV, VI:  full range of motion, no nystagmus, no ptosis CN V: facial sensation intact CN VII: upper and lower face symmetric CN VIII: hearing intact to finger rub CN IX, X: gag intact, uvula midline CN XI: sternocleidomastoid and trapezius muscles intact CN XII: tongue midline Bulk & Tone: normal, no fasciculations, no cogwheeling Motor: 5/5 throughout with no pronator drift. Sensation: intact to light touch, cold, pin, vibration and joint position sense.  No extinction to double simultaneous stimulation.  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 His neurological exam is non-focal, MMSE  today 25/30 (22/30 in March 2019 at PCP). Symptoms suggestive of mild dementia with behavioral disturbance, likely due to Alzheimer's disease. Head CT no acute changes, unable to do MRI due to pacemaker. TSH and B12 normal. The diagnosis, prognosis, and management were discussed at length with the patient and family today. We discussed medications, including Namenda (would avoid Aricept due to cardiac issues), but also medications for mood/behavioral issues that are associated with dementia. His family feels he would benefit from an SSRI, we have agreed to start Lexapro 10mg  daily, side effects were discussed. After a month, family will call and we will plan to start Namenda at that point. We discussed the importance of control of vascular risk factors, physical exercise, and brain stimulation exercises for brain health. Neurosurgeon, resources for local support groups were also provided today. He will follow-up in 6 months and knows to call for any changes.   Thank you for allowing me to participate in the care of this patient. Please do not hesitate to call for any questions or concerns.   Ellouise Newer, M.D.  CC: Dr. Wolfgang Phoenix

## 2017-11-26 NOTE — Patient Instructions (Signed)
1. Start Lexapro 10mg  daily 2. After a month, call our office and we will send in prescription for Namenda 10mg  twice a day to help slow down the worsening of memory 3. Follow-up in 6 months, call for any changes  FALL PRECAUTIONS: Be cautious when walking. Scan the area for obstacles that may increase the risk of trips and falls. When getting up in the mornings, sit up at the edge of the bed for a few minutes before getting out of bed. Consider elevating the bed at the head end to avoid drop of blood pressure when getting up. Walk always in a well-lit room (use night lights in the walls). Avoid area rugs or power cords from appliances in the middle of the walkways. Use a walker or a cane if necessary and consider physical therapy for balance exercise. Get your eyesight checked regularly.  FINANCIAL OVERSIGHT: Supervision, especially oversight when making financial decisions or transactions is also recommended.  HOME SAFETY: Consider the safety of the kitchen when operating appliances like stoves, microwave oven, and blender. Consider having supervision and share cooking responsibilities until no longer able to participate in those. Accidents with firearms and other hazards in the house should be identified and addressed as well.  DRIVING: Regarding driving, in patients with progressive memory problems, driving will be impaired. We advise to have someone else do the driving if trouble finding directions or if minor accidents are reported. Independent driving assessment is available to determine safety of driving.  ABILITY TO BE LEFT ALONE: If patient is unable to contact 911 operator, consider using LifeLine, or when the need is there, arrange for someone to stay with patients. Smoking is a fire hazard, consider supervision or cessation. Risk of wandering should be assessed by caregiver and if detected at any point, supervision and safe proof recommendations should be instituted.  MEDICATION  SUPERVISION: Inability to self-administer medication needs to be constantly addressed. Implement a mechanism to ensure safe administration of the medications.  RECOMMENDATIONS FOR ALL PATIENTS WITH MEMORY PROBLEMS: 1. Continue to exercise (Recommend 30 minutes of walking everyday, or 3 hours every week) 2. Increase social interactions - continue going to [place] and enjoy social gatherings with friends and family 3. Eat healthy, avoid fried foods and eat more fruits and vegetables 4. Maintain adequate blood pressure, blood sugar, and blood cholesterol level. Reducing the risk of stroke and cardiovascular disease also helps promoting better memory. 5. Avoid stressful situations. Live a simple life and avoid aggravations. Organize your time and prepare for the next day in anticipation. 6. Sleep well, avoid any interruptions of sleep and avoid any distractions in the bedroom that may interfere with adequate sleep quality 7. Avoid sugar, avoid sweets as there is a strong link between excessive sugar intake, diabetes, and cognitive impairment The Mediterranean diet has been shown to help patients reduce the risk of progressive memory disorders and reduces cardiovascular risk. This includes eating fish, eat fruits and green leafy vegetables, nuts like almonds and hazelnuts, walnuts, and also use olive oil. Avoid fast foods and fried foods as much as possible. Avoid sweets and sugar as sugar use has been linked to worsening of memory function.  There is always a concern of gradual progression of memory problems. If this is the case, then we may need to adjust level of care according to patient needs. Support, both to the patient and caregiver, should then be put into place.

## 2017-12-03 ENCOUNTER — Ambulatory Visit (INDEPENDENT_AMBULATORY_CARE_PROVIDER_SITE_OTHER): Payer: Medicare Other | Admitting: *Deleted

## 2017-12-03 DIAGNOSIS — I441 Atrioventricular block, second degree: Secondary | ICD-10-CM | POA: Diagnosis not present

## 2017-12-03 NOTE — Progress Notes (Signed)
Remote pacemaker transmission.   

## 2017-12-05 LAB — CUP PACEART REMOTE DEVICE CHECK
Brady Statistic AP VP Percent: 0 %
Brady Statistic AP VS Percent: 25 %
Brady Statistic AS VP Percent: 0 %
Implantable Lead Implant Date: 20130605
Implantable Lead Implant Date: 20130605
Implantable Lead Location: 753859
Implantable Lead Model: 5076
Implantable Pulse Generator Implant Date: 20130605
Lead Channel Impedance Value: 461 Ohm
Lead Channel Impedance Value: 721 Ohm
Lead Channel Pacing Threshold Amplitude: 0.5 V
Lead Channel Pacing Threshold Amplitude: 0.625 V
Lead Channel Pacing Threshold Pulse Width: 0.4 ms
Lead Channel Pacing Threshold Pulse Width: 0.4 ms
Lead Channel Setting Pacing Amplitude: 1.5 V
Lead Channel Setting Pacing Pulse Width: 0.4 ms
MDC IDC LEAD LOCATION: 753860
MDC IDC MSMT BATTERY IMPEDANCE: 280 Ohm
MDC IDC MSMT BATTERY REMAINING LONGEVITY: 133 mo
MDC IDC MSMT BATTERY VOLTAGE: 2.79 V
MDC IDC SESS DTM: 20190529125520
MDC IDC SET LEADCHNL RV PACING AMPLITUDE: 2 V
MDC IDC SET LEADCHNL RV SENSING SENSITIVITY: 2.8 mV
MDC IDC STAT BRADY AS VS PERCENT: 75 %

## 2017-12-11 ENCOUNTER — Encounter: Payer: Self-pay | Admitting: Internal Medicine

## 2017-12-23 ENCOUNTER — Telehealth: Payer: Self-pay | Admitting: Neurology

## 2017-12-23 MED ORDER — ESCITALOPRAM OXALATE 10 MG PO TABS: 10.0000 mg | ORAL_TABLET | Freq: Every day | ORAL | 3 refills | Status: DC

## 2017-12-23 MED ORDER — MEMANTINE HCL 10 MG PO TABS: 10.0000 mg | ORAL_TABLET | Freq: Two times a day (BID) | ORAL | 3 refills | Status: DC

## 2017-12-23 NOTE — Telephone Encounter (Signed)
Rxs sent

## 2018-01-19 ENCOUNTER — Ambulatory Visit: Payer: Medicare Other | Admitting: Neurology

## 2018-01-19 ENCOUNTER — Encounter

## 2018-01-21 ENCOUNTER — Ambulatory Visit (INDEPENDENT_AMBULATORY_CARE_PROVIDER_SITE_OTHER): Payer: Medicare Other | Admitting: Family Medicine

## 2018-01-21 VITALS — BP 110/70 | Ht 67.0 in | Wt 187.0 lb

## 2018-01-21 DIAGNOSIS — I10 Essential (primary) hypertension: Secondary | ICD-10-CM

## 2018-01-21 DIAGNOSIS — E7800 Pure hypercholesterolemia, unspecified: Secondary | ICD-10-CM | POA: Diagnosis not present

## 2018-01-21 DIAGNOSIS — F0390 Unspecified dementia without behavioral disturbance: Secondary | ICD-10-CM | POA: Diagnosis not present

## 2018-01-21 DIAGNOSIS — E119 Type 2 diabetes mellitus without complications: Secondary | ICD-10-CM

## 2018-01-21 LAB — POCT GLYCOSYLATED HEMOGLOBIN (HGB A1C): Hemoglobin A1C: 6.1 % — AB (ref 4.0–5.6)

## 2018-01-21 MED ORDER — GLUCOSE BLOOD VI STRP: ORAL_STRIP | 99 refills | Status: DC

## 2018-01-21 NOTE — Progress Notes (Signed)
   Subjective:    Patient ID: Chad Martinez, male    DOB: [birth year], 83 y.o.   MRN: [id number] Patient arrives with numerous concerns Diabetes  He presents for his follow-up diabetic visit. He has type 2 diabetes mellitus. Risk factors for coronary artery disease include diabetes mellitus, dyslipidemia, hypertension and sedentary lifestyle. Current diabetic treatment includes diet. He is compliant with treatment all of the time. His weight is stable. He is following a diabetic diet. He has not had a previous visit with a dietitian. He does not see a podiatrist.Eye exam is not current.   Patient claims compliance with diabetes medication. No obvious side effects. Reports no substantial low sugar spells. Most numbers are generally in good range when checked fasting. Generally does not miss a dose of medication. Watching diabetic diet closely  Blood pressure medicine and blood pressure levels reviewed today with patient. Compliant with blood pressure medicine. States does not miss a dose. No obvious side effects. Blood pressure generally good when checked elsewhere. Watching salt intake.   Patient continues to take lipid medication regularly. No obvious side effects from it. Generally does not miss a dose. Prior blood work results are reviewed with patient. Patient continues to work on fat intake in diet  Neurologist note reviewed in presence of family.  They also confirm early dementia.  Patient was started on Lexapro and Namenda which per patient has helped somewhat   Results for orders placed or performed in visit on 01/21/18  POCT glycosylated hemoglobin (Hb A1C)  Result Value Ref Range   Hemoglobin A1C 6.1 (A) 4.0 - 5.6 %   HbA1c POC (<> result, manual entry)  4.0 - 5.6 %   HbA1c, POC (prediabetic range)  5.7 - 6.4 %   HbA1c, POC (controlled diabetic range)  0.0 - 7.0 %   Numbers overall 13 0s  Trying to stay active, and walkong some,   Still having difficulty with memory and thinking    Still having urianation issues, due to see urologist soon   Review of Systems No headache, no major weight loss or weight gain, no chest pain no back pain abdominal pain no change in bowel habits complete ROS otherwise negative     Objective:   Physical Exam Alert and oriented, vitals reviewed and stable, NAD ENT-TM's and ext canals WNL bilat via otoscopic exam Soft palate, tonsils and post pharynx WNL via oropharyngeal exam Neck-symmetric, no masses; thyroid nonpalpable and nontender Pulmonary-no tachypnea or accessory muscle use; Clear without wheezes via auscultation Card--no abnrml murmurs, rhythm reg and rate WNL Carotid pulses symmetric, without bruits        Assessment & Plan:  1 impression type 2 diabetes.  Discussed good control compliance with medications and diet discussed no hypoglycemia  2.  Hypertension.  Blood pressure eczema reviewed to maintain same medicines salt intake discussed exercise discussed  3.  Hyperlipidemia prior blood work reviewed to maintain same  4.  Dementia discussed at length.  Perhaps slight improvement on medications per family.  Overall tolerating them well.  No noticeable worsening at this time  [facility]  Medications refilled diet exercise discussed

## 2018-01-23 DIAGNOSIS — R972 Elevated prostate specific antigen [PSA]: Secondary | ICD-10-CM | POA: Diagnosis not present

## 2018-01-23 DIAGNOSIS — N138 Other obstructive and reflux uropathy: Secondary | ICD-10-CM | POA: Diagnosis not present

## 2018-01-23 DIAGNOSIS — N401 Enlarged prostate with lower urinary tract symptoms: Secondary | ICD-10-CM | POA: Diagnosis not present

## 2018-01-23 DIAGNOSIS — R339 Retention of urine, unspecified: Secondary | ICD-10-CM | POA: Diagnosis not present

## 2018-02-22 ENCOUNTER — Encounter (HOSPITAL_COMMUNITY): Payer: Self-pay | Admitting: *Deleted

## 2018-02-22 ENCOUNTER — Ambulatory Visit (HOSPITAL_COMMUNITY)
Admission: EM | Admit: 2018-02-22 | Discharge: 2018-02-22 | Disposition: A | Discharge status: Home / Self Care | Payer: Medicare Other | Attending: Family Medicine | Admitting: Family Medicine

## 2018-02-22 DIAGNOSIS — I2510 Atherosclerotic heart disease of native coronary artery without angina pectoris: Secondary | ICD-10-CM | POA: Diagnosis not present

## 2018-02-22 DIAGNOSIS — Z87891 Personal history of nicotine dependence: Secondary | ICD-10-CM | POA: Diagnosis not present

## 2018-02-22 DIAGNOSIS — I10 Essential (primary) hypertension: Secondary | ICD-10-CM | POA: Diagnosis not present

## 2018-02-22 DIAGNOSIS — Z950 Presence of cardiac pacemaker: Secondary | ICD-10-CM | POA: Insufficient documentation

## 2018-02-22 DIAGNOSIS — E785 Hyperlipidemia, unspecified: Secondary | ICD-10-CM | POA: Insufficient documentation

## 2018-02-22 DIAGNOSIS — N3001 Acute cystitis with hematuria: Secondary | ICD-10-CM | POA: Diagnosis not present

## 2018-02-22 DIAGNOSIS — Z79899 Other long term (current) drug therapy: Secondary | ICD-10-CM | POA: Insufficient documentation

## 2018-02-22 DIAGNOSIS — Z7982 Long term (current) use of aspirin: Secondary | ICD-10-CM | POA: Diagnosis not present

## 2018-02-22 DIAGNOSIS — N400 Enlarged prostate without lower urinary tract symptoms: Secondary | ICD-10-CM | POA: Diagnosis not present

## 2018-02-22 DIAGNOSIS — K219 Gastro-esophageal reflux disease without esophagitis: Secondary | ICD-10-CM | POA: Diagnosis not present

## 2018-02-22 DIAGNOSIS — R300 Dysuria: Secondary | ICD-10-CM | POA: Diagnosis present

## 2018-02-22 DIAGNOSIS — Z951 Presence of aortocoronary bypass graft: Secondary | ICD-10-CM | POA: Diagnosis not present

## 2018-02-22 HISTORY — DX: Presence of cardiac pacemaker: Z95.0

## 2018-02-22 MED ORDER — CIPROFLOXACIN HCL 500 MG PO TABS: 500.0000 mg | ORAL_TABLET | Freq: Two times a day (BID) | ORAL | 0 refills | Status: AC

## 2018-02-22 NOTE — ED Triage Notes (Signed)
C/O dysuria x 4 days without fever.  Denies hematuria.  Denies abd pain.

## 2018-02-22 NOTE — ED Provider Notes (Signed)
[facility]   SUBJECTIVE:  Chad Martinez is a 83 y.o. male who complains of dysuria,urinary urgency and frequency for the past 4 days.  Patient denies a precipitating event, recent sexual encounter, excessive caffeine intake.  Denies abdominal or flank pain.  Has NOT tried OTC medications.  Symptoms are made worse with urination.  Admits to similar symptoms in the past.  Denies fever, chills, nausea, vomiting, abdominal pain, flank pain, or hematuria.    LMP: No LMP for male patient.  ROS: As in HPI.  Past Medical History:  Diagnosis Date  . Colon polyps   . Coronary artery disease    s/p CABG x 6  . Detached retina    Partial  . Elevated PSA   . Enlarged prostate   . Hyperlipidemia   . Hypertension   . Pacemaker   . Pneumothorax 12/10/2011   right rib fx after fall from ladder  . Reflux   . Syncope    Permanent Pacemaker 12/11/2011 MDT Adapta   Past Surgical History:  Procedure Laterality Date  . BYPASS GRAFT  10/08   LIMA to LAD,SVG to 2nd & 3rd obtuse marginal CX,SVG to PDA,1st posterolateral and 2nd posterolateral RCA  . CARDIAC SURGERY    . COLONOSCOPY N/A 01/25/2013   Procedure: COLONOSCOPY;  Surgeon: Daneil Dolin, MD;  [facility];  Service: Endoscopy;  Laterality: N/A;  8:30 Am  . CORONARY ARTERY BYPASS GRAFT  04/19/2012  . EYE SURGERY  2004  . HEMORROIDECTOMY    . NM MYOVIEW LTD  11/05/2010   no ischemia  . PERMANENT PACEMAKER INSERTION  12/11/2011   MDT Adapta  . PERMANENT PACEMAKER INSERTION N/A 12/11/2011   Procedure: PERMANENT PACEMAKER INSERTION;  Surgeon: Sanda Klein, MD;  Location: [facility];  Service: Cardiovascular;  Laterality: N/A;  . SKIN GRAFT  2012  . US ECHOCARDIOGRAPHY  10/14/2007   mild mitral annular ca+,EF 55%,AOV mildly sclerotic   Allergies  Allergen Reactions  . Bactrim [Sulfamethoxazole-Trimethoprim] Rash   No current facility-administered medications on file prior to encounter.    Current Outpatient  Medications on File Prior to Encounter  Medication Sig Dispense Refill  . aspirin EC 81 MG tablet Take 81 mg by mouth daily.    Marland Kitchen escitalopram (LEXAPRO) 10 MG tablet Take 1 tablet (10 mg total) by mouth daily. 30 tablet 3  . finasteride (PROSCAR) 5 MG tablet Take 1 tablet by mouth daily.    . memantine (NAMENDA) 10 MG tablet Take 1 tablet (10 mg total) by mouth 2 (two) times daily. 180 tablet 3  . metoprolol tartrate (LOPRESSOR) 25 MG tablet Take 1 tablet (25 mg total) by mouth 2 (two) times daily. 180 tablet 3  . simvastatin (ZOCOR) 80 MG tablet Take 1 tablet (80 mg total) by mouth at bedtime. 90 tablet 3  . tamsulosin (FLOMAX) 0.4 MG CAPS capsule Take 1 capsule (0.4 mg total) by mouth daily. 30 capsule 0  . glucose blood test strip Tests sugar once a day- DX E11.9 50 each prn  . nitroGLYCERIN (NITROSTAT) 0.4 MG SL tablet Place 1 tablet (0.4 mg total) under the tongue every 5 (five) minutes as needed for chest pain (x 3 doses). 25 tablet 3   Social History   Socioeconomic History  . Marital status: Married    Spouse name: Not on file  . Number of children: Not on file  . Years of education: Not on file  . Highest education level: Not on file  Occupational History  . Not on file  Social Needs  . Financial resource strain: Not on file  . Food insecurity:    Worry: Not on file    Inability: Not on file  . Transportation needs:    Medical: Not on file    Non-medical: Not on file  Tobacco Use  . Smoking status: Former Smoker    Packs/day: 3.00    Years: 20.00    Pack years: 60.00    Last attempt to quit: 12/19/1974    Years since quitting: 43.2  . Smokeless tobacco: Never Used  Substance and Sexual Activity  . Alcohol use: No  . Drug use: No  . Sexual activity: Not on file  Lifestyle  . Physical activity:    Days per week: Not on file    Minutes per session: Not on file  . Stress: Not on file  Relationships  . Social connections:    Talks on phone: Not on file    Gets  together: Not on file    Attends religious service: Not on file    Active member of club or organization: Not on file    Attends meetings of clubs or organizations: Not on file    Relationship status: Not on file  . Intimate partner violence:    Fear of current or ex partner: Not on file    Emotionally abused: Not on file    Physically abused: Not on file    Forced sexual activity: Not on file  Other Topics Concern  . Not on file  Social History Narrative  . Not on file   Family History  Problem Relation Age of Onset  . Pneumonia Mother   . Multiple sclerosis Father   . Lung cancer Brother     OBJECTIVE:  Vitals:   02/22/18 1042  BP: (!) 171/81  Pulse: 72  Resp: 18  Temp: 98.2 F (36.8 C)  TempSrc: Oral  SpO2: 97%   General appearance: Alert and active; in no acute distress; nontoxic appearance HEENT: NCAT.  Oropharynx clear.  Lungs: clear to auscultation bilaterally without adventitious breath sounds Heart: regular rate and rhythm.  Radial pulses 2+ symmetrical bilaterally Abdomen: soft; non-distended; nontender to palpation; bowel sounds present; no guarding Back: no CVA tenderness Extremities: no edema; symmetrical with no gross deformities Skin: warm and dry Neurologic: Ambulates from chair to exam table without difficulty Psychological: alert and cooperative; normal mood and affect  Labs Reviewed  URINE CULTURE   No results found for this or any previous visit (from the past 24 hour(s)).  ASSESSMENT & PLAN:  1. Acute cystitis with hematuria     Meds ordered this encounter  Medications  . ciprofloxacin (CIPRO) 500 MG tablet    Sig: Take 1 tablet (500 mg total) by mouth every 12 (twelve) hours for 7 days.    Dispense:  14 tablet    Refill:  0    Order Specific Question:   Supervising Provider    Answer:   Wynona Luna [[id number]]   Urine showed signs of infection.  Urine culture sent.  We will call you with the results.   Push fluids and get  plenty of rest.   Take antibiotic as directed and to completion Follow up with PCP or urologist if symptoms persists Return here or go to ER if you have any new or worsening symptoms such as fever, worsening abdominal pain, nausea/vomiting, flank pain, etc...  Outlined signs and symptoms indicating need for more  acute intervention. Patient verbalized understanding. After Visit Summary given.     Lestine Box, PA-C 02/22/18 1538

## 2018-02-22 NOTE — ED Notes (Signed)
Urinalysis testing exceeded the limitations of the [employer] capabilities

## 2018-02-22 NOTE — ED Triage Notes (Signed)
Spouse states urologist has a Rx for pt to use if ever needed, but developed rash in past with it.

## 2018-02-22 NOTE — Discharge Instructions (Addendum)
Urine culture sent.  We will call you with the results.   Push fluids and get plenty of rest.   Cipro prescribed.   Take antibiotic as directed and to completion Follow up with PCP or urologist if symptoms persists Return here or go to ER if you have any new or worsening symptoms such as fever, worsening abdominal pain, nausea/vomiting, flank pain, etc..Marland Kitchen

## 2018-02-24 LAB — URINE CULTURE: Culture: >=100000 — AB

## 2018-02-26 ENCOUNTER — Telehealth (HOSPITAL_COMMUNITY): Payer: Self-pay

## 2018-02-26 NOTE — Telephone Encounter (Signed)
Per Dr. Meda Coffee pt has proper treatment with Cipro due to sensitivity to Levofloxacin. Pt called and wife reports that he is feeling better on antibiotics. Encouraged to be seen again for persistent symptoms.

## 2018-03-04 ENCOUNTER — Telehealth: Payer: Self-pay

## 2018-03-04 ENCOUNTER — Ambulatory Visit (INDEPENDENT_AMBULATORY_CARE_PROVIDER_SITE_OTHER): Payer: Medicare Other | Admitting: *Deleted

## 2018-03-04 DIAGNOSIS — I441 Atrioventricular block, second degree: Secondary | ICD-10-CM

## 2018-03-04 NOTE — Telephone Encounter (Signed)
Confirmed remote transmission w/ pt wife.   

## 2018-03-04 NOTE — Progress Notes (Signed)
Remote pacemaker transmission.   

## 2018-03-05 ENCOUNTER — Telehealth: Payer: Self-pay | Admitting: Neurology

## 2018-03-05 NOTE — Telephone Encounter (Signed)
Patient's wife called and wanted to let Dr. Delice Lesch know that the Namenda medication is not seeing to help. She would like to see if Dr. Delice Lesch would call in Aricept instead? He would like to have a 90 day supply called into [facility] in [place]. Thanks

## 2018-03-10 MED ORDER — DONEPEZIL HCL 5 MG PO TABS: 5.0000 mg | ORAL_TABLET | Freq: Every day | ORAL | 0 refills | Status: DC

## 2018-03-10 NOTE — Telephone Encounter (Signed)
Spoke with pt's wife, Nevin Bloodgood, advising her of message below.  She states that she would still like to try Aricept.  Nevin Bloodgood let me know that pt has an upcoming appointment with cards next week and she will be discussing Aricept with them at that time.  Aricept 5mg  #90 with no refills send to [facility] in [place].  Advised pt's wife to call office with update and I will send refills at that time if pt does not experience any side effects.

## 2018-03-10 NOTE — Telephone Encounter (Signed)
Pls let her know that the reason we chose Namenda was that it has less effects on the heart. Aricept can lower heart rate in some people, but since he has a pacemaker we can start a low dose and see how he does. If she is fine, pls send in Rx for Aricept 5mg  daily. Thanks

## 2018-03-18 ENCOUNTER — Encounter: Payer: Self-pay | Admitting: Cardiovascular Disease

## 2018-03-18 ENCOUNTER — Ambulatory Visit (INDEPENDENT_AMBULATORY_CARE_PROVIDER_SITE_OTHER): Payer: Medicare Other | Admitting: Cardiovascular Disease

## 2018-03-18 VITALS — BP 142/72 | HR 82 | Ht 67.0 in | Wt 187.6 lb

## 2018-03-18 DIAGNOSIS — R001 Bradycardia, unspecified: Secondary | ICD-10-CM | POA: Diagnosis not present

## 2018-03-18 DIAGNOSIS — N183 Chronic kidney disease, stage 3 unspecified: Secondary | ICD-10-CM

## 2018-03-18 DIAGNOSIS — I10 Essential (primary) hypertension: Secondary | ICD-10-CM

## 2018-03-18 DIAGNOSIS — E119 Type 2 diabetes mellitus without complications: Secondary | ICD-10-CM

## 2018-03-18 DIAGNOSIS — I441 Atrioventricular block, second degree: Secondary | ICD-10-CM

## 2018-03-18 DIAGNOSIS — I25810 Atherosclerosis of coronary artery bypass graft(s) without angina pectoris: Secondary | ICD-10-CM | POA: Diagnosis not present

## 2018-03-18 DIAGNOSIS — Z950 Presence of cardiac pacemaker: Secondary | ICD-10-CM | POA: Diagnosis not present

## 2018-03-18 DIAGNOSIS — E7800 Pure hypercholesterolemia, unspecified: Secondary | ICD-10-CM

## 2018-03-18 NOTE — Progress Notes (Signed)
Cardiology Office Note    Date:  03/19/2018   ID:  Chad Martinez, DOB 1935/06/01, MRN [id number]  PCP:  Mikey Kirschner, MD  Cardiologist:  Quay Burow, M.D.; Sanda Klein, MD   Chief Complaint  Patient presents with  . Pacemaker Check    History of Present Illness:  Chad Martinez is a 83 y.o. 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 He sees Dr. Gwenlyn Found for coronary artery disease with history of 6 vessel bypass surgery in 2008. He has normal left ventricular systolic function and had a normal nuclear study in 2017. He has treated hypertension and hyperlipidemia.   He is doing well and has not had any cardiac problems.  His wife had to undergo mastectomy for breast cancer which has changed her schedule this year.  He is now taking Aricept for short-term memory issues.  The patient specifically denies any chest pain at rest exertion, dyspnea at rest or with exertion, orthopnea, paroxysmal nocturnal dyspnea, syncope, palpitations, focal neurological deficits, intermittent claudication, lower extremity edema, unexplained weight gain, cough, hemoptysis or wheezing.  He is not troubled by urinary problems as much as it was a year ago.  Interrogation of his pacemaker shows normal device function and anticipated generator longevity of 10.5 years. There is 31% atrial pacing and <1% ventricular pacing.  He has not had any ventricular tachycardia or atrial fibrillation.  He has had some extremely brief episodes of paroxysmal atrial tachycardia lasting less than a minute. He is not pacemaker dependent. Heart rate histogram distribution is favorable.  Past Medical History:  Diagnosis Date  . Colon polyps   . Coronary artery disease    s/p CABG x 6  . Detached retina    Partial  . Elevated PSA   . Enlarged prostate   . Hyperlipidemia   . Hypertension   . Pacemaker   . Pneumothorax 12/10/2011   right rib  fx after fall from ladder  . Reflux   . Syncope    Permanent Pacemaker 12/11/2011 MDT Adapta    Past Surgical History:  Procedure Laterality Date  . BYPASS GRAFT  10/08   LIMA to LAD,SVG to 2nd & 3rd obtuse marginal CX,SVG to PDA,1st posterolateral and 2nd posterolateral RCA  . CARDIAC SURGERY    . COLONOSCOPY N/A 01/25/2013   Procedure: COLONOSCOPY;  Surgeon: Daneil Dolin, MD;  [facility];  Service: Endoscopy;  Laterality: N/A;  8:30 Am  . CORONARY ARTERY BYPASS GRAFT  04/19/2012  . EYE SURGERY  2004  . HEMORROIDECTOMY    . NM MYOVIEW LTD  11/05/2010   no ischemia  . PERMANENT PACEMAKER INSERTION  12/11/2011   MDT Adapta  . PERMANENT PACEMAKER INSERTION N/A 12/11/2011   Procedure: PERMANENT PACEMAKER INSERTION;  Surgeon: Sanda Klein, MD;  Location: [facility];  Service: Cardiovascular;  Laterality: N/A;  . SKIN GRAFT  2012  . US ECHOCARDIOGRAPHY  10/14/2007   mild mitral annular ca+,EF 55%,AOV mildly sclerotic    Current Medications: Outpatient Medications Prior to Visit  Medication Sig Dispense Refill  . aspirin EC 81 MG tablet Take 81 mg by mouth daily.    Marland Kitchen donepezil (ARICEPT) 5 MG tablet Take 1 tablet (5 mg total) by mouth at bedtime. 90 tablet 0  . escitalopram (LEXAPRO) 10 MG tablet Take 1 tablet (10 mg total) by mouth daily. 30 tablet 3  . finasteride (PROSCAR) 5 MG tablet Take 1 tablet  by mouth daily.    Marland Kitchen glucose blood test strip Tests sugar once a day- DX E11.9 50 each prn  . nitroGLYCERIN (NITROSTAT) 0.4 MG SL tablet Place 1 tablet (0.4 mg total) under the tongue every 5 (five) minutes as needed for chest pain (x 3 doses). 25 tablet 3  . tamsulosin (FLOMAX) 0.4 MG CAPS capsule Take 1 capsule (0.4 mg total) by mouth daily. 30 capsule 0  . memantine (NAMENDA) 10 MG tablet Take 1 tablet (10 mg total) by mouth 2 (two) times daily. (Patient not taking: Reported on 03/18/2018) 180 tablet 3  . metoprolol tartrate (LOPRESSOR) 25 MG tablet Take 1 tablet (25 mg total)  by mouth 2 (two) times daily. 180 tablet 3  . simvastatin (ZOCOR) 80 MG tablet Take 1 tablet (80 mg total) by mouth at bedtime. 90 tablet 3   No facility-administered medications prior to visit.      Allergies:   Bactrim [sulfamethoxazole-trimethoprim]   Social History   Socioeconomic History  . Marital status: Married    Spouse name: Not on file  . Number of children: Not on file  . Years of education: Not on file  . Highest education level: Not on file  Occupational History  . Not on file  Social Needs  . Financial resource strain: Not on file  . Food insecurity:    Worry: Not on file    Inability: Not on file  . Transportation needs:    Medical: Not on file    Non-medical: Not on file  Tobacco Use  . Smoking status: Former Smoker    Packs/day: 3.00    Years: 20.00    Pack years: 60.00    Last attempt to quit: 12/19/1974    Years since quitting: 43.2  . Smokeless tobacco: Never Used  Substance and Sexual Activity  . Alcohol use: No  . Drug use: No  . Sexual activity: Not on file  Lifestyle  . Physical activity:    Days per week: Not on file    Minutes per session: Not on file  . Stress: Not on file  Relationships  . Social connections:    Talks on phone: Not on file    Gets together: Not on file    Attends religious service: Not on file    Active member of club or organization: Not on file    Attends meetings of clubs or organizations: Not on file    Relationship status: Not on file  Other Topics Concern  . Not on file  Social History Narrative  . Not on file     Family History:  The patient's family history includes Lung cancer in his brother; Multiple sclerosis in his father; Pneumonia in his mother.   ROS:   Please see the history of present illness.    ROS all other systems are reviewed and are negative PHYSICAL EXAM:   VS:  BP (!) 142/72   Pulse 82   Ht 5\' 7"  (1.702 m)   Wt 187 lb 9.6 oz (85.1 kg)   BMI 29.38 kg/m      General: Alert,  oriented x3, no distress, overweight Head: no evidence of trauma, PERRL, EOMI, no exophtalmos or lid lag, no myxedema, no xanthelasma; normal ears, nose and oropharynx Neck: normal jugular venous pulsations and no hepatojugular reflux; brisk carotid pulses without delay and no carotid bruits Chest: clear to auscultation, no signs of consolidation by percussion or palpation, normal fremitus, symmetrical and full respiratory excursions Cardiovascular: normal  position and quality of the apical impulse, regular rhythm, normal first and widely split second heart sounds, no murmurs, rubs or gallops .  Healthy left subclavian pacemaker site Abdomen: no tenderness or distention, no masses by palpation, no abnormal pulsatility or arterial bruits, normal bowel sounds, no hepatosplenomegaly Extremities: no clubbing, cyanosis or edema; 2+ radial, ulnar and brachial pulses bilaterally; 2+ right femoral, posterior tibial and dorsalis pedis pulses; 2+ left femoral, posterior tibial and dorsalis pedis pulses; no subclavian or femoral bruits Neurological: grossly nonfocal Psych: Normal mood and affect  Psych: euthymic mood, full affect  Wt Readings from Last 3 Encounters:  03/18/18 187 lb 9.6 oz (85.1 kg)  01/21/18 187 lb (84.8 kg)  11/26/17 186 lb (84.4 kg)      Studies/Labs Reviewed:   EKG:  EKG is ordered today.  It shows atrial paced, ventricular sensed rhythm with old right bundle branch block.  460 ms  Recent Labs: 09/29/2017: ALT 18; BUN 22; Creatinine, Ser 1.23; Hemoglobin 14.8; Platelets 155; Potassium 4.8; Sodium 144 09/30/2017: TSH 2.160   Lipid Panel    Component Value Date/Time   CHOL 98 (L) 09/29/2017 0835   TRIG 91 09/29/2017 0835   HDL 40 09/29/2017 0835   CHOLHDL 2.5 09/29/2017 0835   CHOLHDL 2.8 01/31/2014 0714   VLDL 24 01/31/2014 0714   LDLCALC 40 09/29/2017 0835    ASSESSMENT:    1. Second degree Mobitz II AV block   2. Pacemaker   3. Coronary artery disease involving  coronary bypass graft of native heart without angina pectoris   4. Essential hypertension   5. Hypercholesterolemia   6. Diabetes mellitus type 2 in nonobese (HCC)   7. CKD (chronic kidney disease) stage 3, GFR 30-59 ml/min (HCC)      PLAN:  In order of problems listed above:  1. Second degree AV block: Although this was symptomatic in the past, it occurs very rarely and he can frequently requires ventricular pacing. 2. PPM: Normal device function.  Remote download in 3 months. 3. CAD s/p CABG: He does not have angina pectoris. 4. HTN: Mildly elevated systolic blood pressure today.  No changes were made to his medications 5. HLP: On statin with excellent LDL cholesterol level. 6. DM: Well controlled without medications.  Last hemoglobin A1c 6.1% in July. 7. Obstructive uropathy with post renal acute renal failure: Resolved.     Medication Adjustments/Labs and Tests Ordered: Current medicines are reviewed at length with the patient today.  Concerns regarding medicines are outlined above.  Medication changes, Labs and Tests ordered today are listed in the Patient Instructions below. Patient Instructions  Dr Sallyanne Kuster recommends that you continue on your current medications as directed. Please refer to the Current Medication list given to you today.  Remote monitoring is used to monitor your Pacemaker or ICD from home. This monitoring reduces the number of office visits required to check your device to one time per year. It allows [place] to keep an eye on the functioning of your device to ensure it is working properly. You are scheduled for a device check from home on Wednesday, November 27th, 2019. You may send your transmission at any time that day. If you have a wireless device, the transmission will be sent automatically. After your physician reviews your transmission, you will receive a notification with your next transmission date.  To improve our patient care and to more adequately follow  your device, [facility] has decided, as a practice, to start following each  patient four times a year with your home monitor. This means that you may experience a remote appointment that is close to an in-office appointment with your physician. Your insurance will apply at the same rate as other remote monitoring transmissions.  Dr Sallyanne Kuster recommends that you schedule a follow-up appointment in 12 months with a pacemaker check. You will receive a reminder letter in the mail two months in advance. If you don't receive a letter, please call our office to schedule the follow-up appointment.  If you need a refill on your cardiac medications before your next appointment, please call your pharmacy.    Signed, Sanda Klein, MD  03/19/2018 4:31 PM    [facility] [address], [place], [place]  [address] Phone: [phone]; Fax: [phone]

## 2018-03-18 NOTE — Patient Instructions (Signed)
Dr Sallyanne Kuster recommends that you continue on your current medications as directed. Please refer to the Current Medication list given to you today.  Remote monitoring is used to monitor your Pacemaker or ICD from home. This monitoring reduces the number of office visits required to check your device to one time per year. It allows [place] to keep an eye on the functioning of your device to ensure it is working properly. You are scheduled for a device check from home on Wednesday, November 27th, 2019. You may send your transmission at any time that day. If you have a wireless device, the transmission will be sent automatically. After your physician reviews your transmission, you will receive a notification with your next transmission date.  To improve our patient care and to more adequately follow your device, [facility] has decided, as a practice, to start following each patient four times a year with your home monitor. This means that you may experience a remote appointment that is close to an in-office appointment with your physician. Your insurance will apply at the same rate as other remote monitoring transmissions.  Dr Sallyanne Kuster recommends that you schedule a follow-up appointment in 12 months with a pacemaker check. You will receive a reminder letter in the mail two months in advance. If you don't receive a letter, please call our office to schedule the follow-up appointment.  If you need a refill on your cardiac medications before your next appointment, please call your pharmacy.

## 2018-03-19 ENCOUNTER — Encounter: Payer: Self-pay | Admitting: Cardiovascular Disease

## 2018-03-19 MED ORDER — SIMVASTATIN 80 MG PO TABS: 80.0000 mg | ORAL_TABLET | Freq: Every day | ORAL | 3 refills | Status: DC

## 2018-03-19 MED ORDER — METOPROLOL TARTRATE 25 MG PO TABS: 25.0000 mg | ORAL_TABLET | Freq: Two times a day (BID) | ORAL | 3 refills | Status: DC

## 2018-03-24 LAB — CUP PACEART REMOTE DEVICE CHECK
Battery Impedance: 304 Ohm
Battery Voltage: 2.79 V
Brady Statistic AP VP Percent: 0 %
Brady Statistic AP VS Percent: 30 %
Brady Statistic AS VP Percent: 0 %
Brady Statistic AS VS Percent: 69 %
Date Time Interrogation Session: 20190828143834
Implantable Lead Implant Date: 20130605
Implantable Lead Location: 753859
Implantable Lead Model: 5076
Implantable Lead Model: 5076
Lead Channel Impedance Value: 682 Ohm
Lead Channel Pacing Threshold Amplitude: 0.625 V
Lead Channel Pacing Threshold Pulse Width: 0.4 ms
Lead Channel Pacing Threshold Pulse Width: 0.4 ms
Lead Channel Setting Pacing Amplitude: 2 V
Lead Channel Setting Pacing Pulse Width: 0.4 ms
MDC IDC LEAD IMPLANT DT: 20130605
MDC IDC LEAD LOCATION: 753860
MDC IDC MSMT BATTERY REMAINING LONGEVITY: 129 mo
MDC IDC MSMT LEADCHNL RA IMPEDANCE VALUE: 455 Ohm
MDC IDC MSMT LEADCHNL RV PACING THRESHOLD AMPLITUDE: 0.375 V
MDC IDC PG IMPLANT DT: 20130605
MDC IDC SET LEADCHNL RA PACING AMPLITUDE: 1.5 V
MDC IDC SET LEADCHNL RV SENSING SENSITIVITY: 2.8 mV

## 2018-03-30 ENCOUNTER — Other Ambulatory Visit: Payer: Self-pay | Admitting: Cardiovascular Disease

## 2018-04-06 ENCOUNTER — Other Ambulatory Visit: Payer: Self-pay | Admitting: Cardiovascular Disease

## 2018-04-20 ENCOUNTER — Telehealth: Payer: Self-pay | Admitting: Family Medicine

## 2018-04-20 ENCOUNTER — Telehealth: Payer: Self-pay | Admitting: Neurology

## 2018-04-20 DIAGNOSIS — I10 Essential (primary) hypertension: Secondary | ICD-10-CM

## 2018-04-20 DIAGNOSIS — E785 Hyperlipidemia, unspecified: Secondary | ICD-10-CM

## 2018-04-20 DIAGNOSIS — Z79899 Other long term (current) drug therapy: Secondary | ICD-10-CM

## 2018-04-20 DIAGNOSIS — E119 Type 2 diabetes mellitus without complications: Secondary | ICD-10-CM

## 2018-04-20 NOTE — Telephone Encounter (Signed)
Rescheduled with patient's wife.

## 2018-04-20 NOTE — Telephone Encounter (Signed)
Pt has wellness scheduled for 04/28/18, pt requesting blood work. Advise.

## 2018-04-20 NOTE — Telephone Encounter (Signed)
Lip liv m7 A1c  

## 2018-04-20 NOTE — Telephone Encounter (Signed)
Orders put in. Pt notified.  

## 2018-04-20 NOTE — Telephone Encounter (Signed)
Last labs march 2019 - lipid, liver, cbc, bmp, a1c, microalbu urine, vit b12, folate, tsh

## 2018-04-20 NOTE — Telephone Encounter (Signed)
Can add him on Oct 31 9am slot. Thanks

## 2018-04-20 NOTE — Telephone Encounter (Signed)
I don't see any appts available prior to December. Dr. Delice Lesch - please advise.

## 2018-04-20 NOTE — Telephone Encounter (Signed)
Patient's wife called regarding his appointment in December. They go to [place] in the Winter and will not be here then. She is asking can he be seen before or after December? Please Advise. Thanks

## 2018-04-21 DIAGNOSIS — E785 Hyperlipidemia, unspecified: Secondary | ICD-10-CM | POA: Diagnosis not present

## 2018-04-21 DIAGNOSIS — E119 Type 2 diabetes mellitus without complications: Secondary | ICD-10-CM | POA: Diagnosis not present

## 2018-04-21 DIAGNOSIS — I10 Essential (primary) hypertension: Secondary | ICD-10-CM | POA: Diagnosis not present

## 2018-04-21 DIAGNOSIS — Z79899 Other long term (current) drug therapy: Secondary | ICD-10-CM | POA: Diagnosis not present

## 2018-04-22 LAB — HEMOGLOBIN A1C
Est. average glucose Bld gHb Est-mCnc: 137 mg/dL
Hgb A1c MFr Bld: 6.4 % — ABNORMAL HIGH (ref 4.8–5.6)

## 2018-04-22 LAB — LIPID PANEL
CHOLESTEROL TOTAL: 94 mg/dL — AB (ref 100–199)
Chol/HDL Ratio: 2.4 ratio (ref 0.0–5.0)
HDL: 39 mg/dL — ABNORMAL LOW (ref >39)
LDL Calculated: 39 mg/dL (ref 0–99)
TRIGLYCERIDES: 78 mg/dL (ref 0–149)
VLDL CHOLESTEROL CAL: 16 mg/dL (ref 5–40)

## 2018-04-22 LAB — HEPATIC FUNCTION PANEL
ALBUMIN: 4.3 g/dL (ref 3.5–4.7)
ALT: 16 IU/L (ref 0–44)
AST: 17 IU/L (ref 0–40)
Alkaline Phosphatase: 55 IU/L (ref 39–117)
BILIRUBIN TOTAL: 1 mg/dL (ref 0.0–1.2)
Bilirubin, Direct: 0.27 mg/dL (ref 0.00–0.40)
Total Protein: 6.4 g/dL (ref 6.0–8.5)

## 2018-04-22 LAB — BASIC METABOLIC PANEL
BUN/Creatinine Ratio: 19 (ref 10–24)
BUN: 20 mg/dL (ref 8–27)
CHLORIDE: 103 mmol/L (ref 96–106)
CO2: 26 mmol/L (ref 20–29)
Calcium: 8.9 mg/dL (ref 8.6–10.2)
Creatinine, Ser: 1.07 mg/dL (ref 0.76–1.27)
GFR, EST AFRICAN AMERICAN: 75 mL/min/{1.73_m2} (ref >59)
GFR, EST NON AFRICAN AMERICAN: 65 mL/min/{1.73_m2} (ref >59)
Glucose: 150 mg/dL — ABNORMAL HIGH (ref 65–99)
POTASSIUM: 4.4 mmol/L (ref 3.5–5.2)
SODIUM: 144 mmol/L (ref 134–144)

## 2018-04-24 ENCOUNTER — Ambulatory Visit: Payer: Medicare Other | Admitting: Family Medicine

## 2018-04-28 ENCOUNTER — Ambulatory Visit (INDEPENDENT_AMBULATORY_CARE_PROVIDER_SITE_OTHER): Payer: Medicare Other | Admitting: Family Medicine

## 2018-04-28 ENCOUNTER — Encounter: Payer: Self-pay | Admitting: Family Medicine

## 2018-04-28 VITALS — BP 136/80 | Ht 66.0 in | Wt 193.4 lb

## 2018-04-28 DIAGNOSIS — E119 Type 2 diabetes mellitus without complications: Secondary | ICD-10-CM

## 2018-04-28 DIAGNOSIS — Z0000 Encounter for general adult medical examination without abnormal findings: Secondary | ICD-10-CM

## 2018-04-28 DIAGNOSIS — Z23 Encounter for immunization: Secondary | ICD-10-CM

## 2018-04-28 DIAGNOSIS — I10 Essential (primary) hypertension: Secondary | ICD-10-CM

## 2018-04-28 DIAGNOSIS — E785 Hyperlipidemia, unspecified: Secondary | ICD-10-CM | POA: Diagnosis not present

## 2018-04-28 DIAGNOSIS — F0390 Unspecified dementia without behavioral disturbance: Secondary | ICD-10-CM | POA: Diagnosis not present

## 2018-04-28 NOTE — Progress Notes (Signed)
Subjective:    Patient ID: Chad Martinez, male    DOB: 05/15/1935, 83 y.o.   MRN: [id number]  HPI AWV- Annual Wellness Visit  The patient was seen for their annual wellness visit. The patient's past medical history, surgical history, and family history were reviewed. Pertinent vaccines were reviewed ( tetanus, pneumonia, shingles, flu) The patient's medication list was reviewed and updated.  The height and weight were entered.  BMI recorded in electronic record elsewhere  Cognitive screening was completed. Outcome of Mini - Cog: pass   Falls /depression screening electronically recorded within record elsewhere  Current tobacco usage:none (All patients who use tobacco were given written and verbal information on quitting)  Recent listing of emergency department/hospitalizations over the past year were reviewed.  current specialist the patient sees on a regular basis: Dr. Lawerance Bach - urology, Dr. Ellouise Newer- neurology, Dr. Gwenlyn Found - cardiologist, Dr. Loletha Grayer - pace maker.   Results for orders placed or performed in visit on 04/20/18  Lipid panel  Result Value Ref Range   Cholesterol, Total 94 (L) 100 - 199 mg/dL   Triglycerides 78 0 - 149 mg/dL   HDL 39 (L) >39 mg/dL   VLDL Cholesterol Cal 16 5 - 40 mg/dL   LDL Calculated 39 0 - 99 mg/dL   Chol/HDL Ratio 2.4 0.0 - 5.0 ratio  Hepatic function panel  Result Value Ref Range   Total Protein 6.4 6.0 - 8.5 g/dL   Albumin 4.3 3.5 - 4.7 g/dL   Bilirubin Total 1.0 0.0 - 1.2 mg/dL   Bilirubin, Direct 0.27 0.00 - 0.40 mg/dL   Alkaline Phosphatase 55 39 - 117 IU/L   AST 17 0 - 40 IU/L   ALT 16 0 - 44 IU/L  Basic metabolic panel  Result Value Ref Range   Glucose 150 (H) 65 - 99 mg/dL   BUN 20 8 - 27 mg/dL   Creatinine, Ser 1.07 0.76 - 1.27 mg/dL   GFR calc non Af Amer 65 >59 mL/min/1.73   GFR calc Af Amer 75 >59 mL/min/1.73   BUN/Creatinine Ratio 19 10 - 24   Sodium 144 134 - 144 mmol/L   Potassium 4.4 3.5 - 5.2 mmol/L   Chloride  103 96 - 106 mmol/L   CO2 26 20 - 29 mmol/L   Calcium 8.9 8.6 - 10.2 mg/dL  Hemoglobin A1c  Result Value Ref Range   Hgb A1c MFr Bld 6.4 (H) 4.8 - 5.6 %   Est. average glucose Bld gHb Est-mCnc 137 mg/dL    Medicare annual wellness visit patient questionnaire was reviewed.  A written screening schedule for the patient for the next 5-10 years was given. Appropriate discussion of followup regarding next visit was discussed.  Concerns about mole on head and if he needs colonoscopy.   Flu vaccine today.        Review of Systems  Constitutional: Negative for activity change, appetite change and fever.  HENT: Negative for congestion and rhinorrhea.   Eyes: Negative for discharge.  Respiratory: Negative for cough and wheezing.   Cardiovascular: Negative for chest pain.  Gastrointestinal: Negative for abdominal pain, blood in stool and vomiting.  Genitourinary: Negative for difficulty urinating and frequency.  Musculoskeletal: Negative for neck pain.  Skin: Negative for rash.  Allergic/Immunologic: Negative for environmental allergies and food allergies.  Neurological: Negative for weakness and headaches.  Psychiatric/Behavioral: Negative for agitation.  All other systems reviewed and are negative.      Objective:   Physical  Exam  Constitutional: He appears well-developed and well-nourished.  HENT:  Head: Normocephalic and atraumatic.  Right Ear: External ear normal.  Left Ear: External ear normal.  Nose: Nose normal.  Mouth/Throat: Oropharynx is clear and moist.  Eyes: Pupils are equal, round, and reactive to light. EOM are normal.  Neck: Normal range of motion. Neck supple. No thyromegaly present.  Cardiovascular: Normal rate, regular rhythm and normal heart sounds.  No murmur heard. Pulmonary/Chest: Effort normal and breath sounds normal. No respiratory distress. He has no wheezes.  Abdominal: Soft. Bowel sounds are normal. He exhibits no distension and no mass. There  is no tenderness.  Genitourinary: Penis normal.  Musculoskeletal: Normal range of motion. He exhibits no edema.  Lymphadenopathy:    He has no cervical adenopathy.  Neurological: He is alert. He exhibits normal muscle tone.  Skin: Skin is warm and dry. No erythema.  Psychiatric: He has a normal mood and affect. His behavior is normal. Judgment normal.  Vitals reviewed.         Assessment & Plan:  Impression wellness exam.  Up-to-date on colonoscopy although now due.  Family wants to think about it..  Diet discussed.  Exercise discussed.  Vaccines discussed and flu shot offered  2.  Type 2 diabetes.  Control good A1c excellent to maintain same therapy  #3  Seborrheic keratosis.  Of scalp.  Discussed no need for dermatology work-up rationale discussed  4.  Dementia.  Discussed.  Mild in nature.  Stable per family due to see neurologist soon  5.  Coronary artery disease clinically stable  6.  Flu shot today.  Blood work reviewed diet exercise discussed follow-up in 6 months.

## 2018-05-01 DIAGNOSIS — H338 Other retinal detachments: Secondary | ICD-10-CM | POA: Diagnosis not present

## 2018-05-01 DIAGNOSIS — H353131 Nonexudative age-related macular degeneration, bilateral, early dry stage: Secondary | ICD-10-CM | POA: Diagnosis not present

## 2018-05-01 DIAGNOSIS — Z961 Presence of intraocular lens: Secondary | ICD-10-CM | POA: Diagnosis not present

## 2018-05-07 ENCOUNTER — Other Ambulatory Visit: Payer: Self-pay

## 2018-05-07 ENCOUNTER — Ambulatory Visit (INDEPENDENT_AMBULATORY_CARE_PROVIDER_SITE_OTHER): Payer: Medicare Other | Admitting: Neurology

## 2018-05-07 ENCOUNTER — Encounter: Payer: Self-pay | Admitting: Neurology

## 2018-05-07 VITALS — BP 182/94 | HR 77 | Ht 67.0 in | Wt 194.0 lb

## 2018-05-07 DIAGNOSIS — F03B18 Unspecified dementia, moderate, with other behavioral disturbance: Secondary | ICD-10-CM

## 2018-05-07 DIAGNOSIS — F0391 Unspecified dementia with behavioral disturbance: Secondary | ICD-10-CM | POA: Diagnosis not present

## 2018-05-07 DIAGNOSIS — I25810 Atherosclerosis of coronary artery bypass graft(s) without angina pectoris: Secondary | ICD-10-CM

## 2018-05-07 MED ORDER — MEMANTINE HCL 10 MG PO TABS: 10.0000 mg | ORAL_TABLET | Freq: Two times a day (BID) | ORAL | 3 refills | Status: DC

## 2018-05-07 MED ORDER — ESCITALOPRAM OXALATE 20 MG PO TABS: 20.0000 mg | ORAL_TABLET | Freq: Every day | ORAL | 3 refills | Status: DC

## 2018-05-07 NOTE — Patient Instructions (Signed)
1. Increase Lexapro to 20mg  daily. With your current prescription of Lexapro 10mg , take 2 tablets daily. Once done, your new bottle will be for Lexapro 20mg , take 1 tablet daily.  2. Continue Namenda 10mg  twice a day  3. Discuss frequent urination with his urologist. If sleep issues continue or hallucinations become more bothersome, we can start medication  4. Follow-up in 6 months or so, call for any changes  FALL PRECAUTIONS: Be cautious when walking. Scan the area for obstacles that may increase the risk of trips and falls. When getting up in the mornings, sit up at the edge of the bed for a few minutes before getting out of bed. Consider elevating the bed at the head end to avoid drop of blood pressure when getting up. Walk always in a well-lit room (use night lights in the walls). Avoid area rugs or power cords from appliances in the middle of the walkways. Use a walker or a cane if necessary and consider physical therapy for balance exercise. Get your eyesight checked regularly.  HOME SAFETY: Consider the safety of the kitchen when operating appliances like stoves, microwave oven, and blender. Consider having supervision and share cooking responsibilities until no longer able to participate in those. Accidents with firearms and other hazards in the house should be identified and addressed as well.  ABILITY TO BE LEFT ALONE: If patient is unable to contact 911 operator, consider using LifeLine, or when the need is there, arrange for someone to stay with patients. Smoking is a fire hazard, consider supervision or cessation. Risk of wandering should be assessed by caregiver and if detected at any point, supervision and safe proof recommendations should be instituted.  RECOMMENDATIONS FOR ALL PATIENTS WITH MEMORY PROBLEMS: 1. Continue to exercise (Recommend 30 minutes of walking everyday, or 3 hours every week) 2. Increase social interactions - continue going to [place] and enjoy social gatherings  with friends and family 3. Eat healthy, avoid fried foods and eat more fruits and vegetables 4. Maintain adequate blood pressure, blood sugar, and blood cholesterol level. Reducing the risk of stroke and cardiovascular disease also helps promoting better memory. 5. Avoid stressful situations. Live a simple life and avoid aggravations. Organize your time and prepare for the next day in anticipation. 6. Sleep well, avoid any interruptions of sleep and avoid any distractions in the bedroom that may interfere with adequate sleep quality 7. Avoid sugar, avoid sweets as there is a strong link between excessive sugar intake, diabetes, and cognitive impairment The Mediterranean diet has been shown to help patients reduce the risk of progressive memory disorders and reduces cardiovascular risk. This includes eating fish, eat fruits and green leafy vegetables, nuts like almonds and hazelnuts, walnuts, and also use olive oil. Avoid fast foods and fried foods as much as possible. Avoid sweets and sugar as sugar use has been linked to worsening of memory function.  There is always a concern of gradual progression of memory problems. If this is the case, then we may need to adjust level of care according to patient needs. Support, both to the patient and caregiver, should then be put into place.

## 2018-05-07 NOTE — Progress Notes (Signed)
NEUROLOGY FOLLOW UP OFFICE NOTE  Chad Martinez [id number] 04-14-1935  HISTORY OF PRESENT ILLNESS: I had the pleasure of seeing Chad Martinez in follow-up in the neurology clinic on 05/07/2018.  The patient was last seen 5 months ago for dementia with behavioral disturbance. He is again accompanied by his wife and daughter who help supplement the history today. MMSE in May 2019 was 25/30. On his last visit, family reported personality changes, he was more withdrawn and less verbal. He was also starting to have visual hallucinations. He was started on Lexapro 10mg  daily, his wife has not noticed much difference but his daughter feels he is more verbal. He also started [place], but his wife felt that he was having more hallucinations and asked to switch to Donepezil, however this seemed to worsen them even more. She switched back to Namenda 6 weeks ago. He would say there were 3 bears or deer outside the house, or that there were people in the car outside. Sometimes he thinks there is a little boy in the house. No auditory component. Hallucinations are not scary for him, they do not bother him so he does not tell his wife as much about them anymore. His wife reports worsening of symptoms, she needs to help him every morning in the shower, he could not recall how to set the water, but yesterday and today he was able to do it by himself. He is independent with dressing but frequently forgets to zip his pants. His wife manages finances and medications. He does not drive. No paranoia. They report that he is having difficulty sleeping, on further questioning, he wakes up multiple times at night to urinate. No wandering behavior. He denies any headaches, dizziness, vision changes, focal numbness/tingling/weakness, no falls. No side effects on medications.   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 He feels his memory has been getting worse and going downhill over the past year. Family started noticing changes around 1-1.5 years ago, he was initially leaving doors and drawers open, or leave the water running. Now he forgets to flush the toilet. His daughter first noticed that he was more quiet. He has always been very jovial and talks to everybody, but now he does not talk as much. He stopped driving in April 7169 after he went the wrong way and got confused, they attribute this to his vision. His wife has always been in charge of finances and medications, but now has to remind him to take his medications because he would get confused about AM/PM medications. He likes to play Bingo but recently has had difficulty. His daughter also noticed that he would have slower processing/understanding when she talks to him. He follows instructions fine. There have been other behavioral changes as well, he is more anxious, pacing around the house. One time he got up at 2AM and showered, then the next morning recalled he took a shower and did not know why he did it. Over the past month he has had visual hallucinations where he would see people in the woods behind their house. No auditory component. He is able to dress and bathe independently. He had an MMSE at PCP office in March 2019 which was 22/30 (24/30 in the summer).  His wife has noticed the past week that he seems to be slurring his words, his  daughter describes it as difficulty finding the right word. He had urinary issues last July with urinary retention requiring self-catheterization, this has improved but since March he has had urinary incontinence and wears pull ups at night and when going out. He denies any headaches, dizziness, diplopia, dysphagia, neck/back pain, focal numbness/tingling/weakness, bowel dysfunction, anosmia, or tremors. He fell from their house 5 years ago and was found to  have AV block with pacemaker placed. No family history of dementia. No history of significant head injuries or alcohol intake.   I personally reviewed head CT without contrast done 10/07/17 which did not show any acute changes, there was mild diffuse atrophy and chronic microvascular disease.   PAST MEDICAL HISTORY: Past Medical History:  Diagnosis Date  . Colon polyps   . Coronary artery disease    s/p CABG x 6  . Detached retina    Partial  . Elevated PSA   . Enlarged prostate   . Hyperlipidemia   . Hypertension   . Pacemaker   . Pneumothorax 12/10/2011   right rib fx after fall from ladder  . Reflux   . Syncope    Permanent Pacemaker 12/11/2011 [facility]    MEDICATIONS: Current Outpatient Medications on File Prior to Visit  Medication Sig Dispense Refill  . aspirin EC 81 MG tablet Take 81 mg by mouth daily.    Marland Kitchen donepezil (ARICEPT) 5 MG tablet Take 1 tablet (5 mg total) by mouth at bedtime. (Patient not taking: Reported on 04/28/2018) 90 tablet 0  . escitalopram (LEXAPRO) 10 MG tablet Take 1 tablet (10 mg total) by mouth daily. 30 tablet 3  . finasteride (PROSCAR) 5 MG tablet Take 1 tablet by mouth daily.    Marland Kitchen glucose blood test strip Tests sugar once a day- DX E11.9 50 each prn  . memantine (NAMENDA) 10 MG tablet Take 10 mg by mouth 2 (two) times daily.  3  . metoprolol tartrate (LOPRESSOR) 25 MG tablet TAKE 1 TABLET BY MOUTH TWICE DAILY 180 tablet 3  . nitroGLYCERIN (NITROSTAT) 0.4 MG SL tablet Place 1 tablet (0.4 mg total) under the tongue every 5 (five) minutes as needed for chest pain (x 3 doses). 25 tablet 3  . simvastatin (ZOCOR) 80 MG tablet TAKE 1 TABLET BY MOUTH AT BEDTIME 90 tablet 3  . tamsulosin (FLOMAX) 0.4 MG CAPS capsule Take 1 capsule (0.4 mg total) by mouth daily. 30 capsule 0   No current facility-administered medications on file prior to visit.     ALLERGIES: Allergies  Allergen Reactions  . Bactrim [Sulfamethoxazole-Trimethoprim] Rash    FAMILY  HISTORY: Family History  Problem Relation Age of Onset  . Pneumonia Mother   . Multiple sclerosis Father   . Lung cancer Brother     SOCIAL HISTORY: Social History   Socioeconomic History  . Marital status: Married    Spouse name: Not on file  . Number of children: Not on file  . Years of education: Not on file  . Highest education level: Not on file  Occupational History  . Not on file  Social Needs  . Financial resource strain: Not on file  . Food insecurity:    Worry: Not on file    Inability: Not on file  . Transportation needs:    Medical: Not on file    Non-medical: Not on file  Tobacco Use  . Smoking status: Former Smoker    Packs/day: 3.00    Years: 20.00    Pack years: 60.00  Last attempt to quit: 12/19/1974    Years since quitting: 43.4  . Smokeless tobacco: Never Used  Substance and Sexual Activity  . Alcohol use: No  . Drug use: No  . Sexual activity: Not on file  Lifestyle  . Physical activity:    Days per week: Not on file    Minutes per session: Not on file  . Stress: Not on file  Relationships  . Social connections:    Talks on phone: Not on file    Gets together: Not on file    Attends religious service: Not on file    Active member of club or organization: Not on file    Attends meetings of clubs or organizations: Not on file    Relationship status: Not on file  . Intimate partner violence:    Fear of current or ex partner: Not on file    Emotionally abused: Not on file    Physically abused: Not on file    Forced sexual activity: Not on file  Other Topics Concern  . Not on file  Social History Narrative  . Not on file    REVIEW OF SYSTEMS: Constitutional: No fevers, chills, or sweats, no generalized fatigue, change in appetite Eyes: No visual changes, double vision, eye pain Ear, nose and throat: No hearing loss, ear pain, nasal congestion, sore throat Cardiovascular: No chest pain, palpitations Respiratory:  No shortness of  breath at rest or with exertion, wheezes GastrointestinaI: No nausea, vomiting, diarrhea, abdominal pain, fecal incontinence Genitourinary:  No dysuria, urinary retention or frequency Musculoskeletal:  No neck pain, back pain Integumentary: No rash, pruritus, skin lesions Neurological: as above Psychiatric: No depression, insomnia, anxiety Endocrine: No palpitations, fatigue, diaphoresis, mood swings, change in appetite, change in weight, increased thirst Hematologic/Lymphatic:  No anemia, purpura, petechiae. Allergic/Immunologic: no itchy/runny eyes, nasal congestion, recent allergic reactions, rashes  PHYSICAL EXAM: Vitals:   05/07/18 0905  BP: (!) 182/94  Pulse: 77  SpO2: 94%   General: No acute distress Head:  Normocephalic/atraumatic Neck: supple, no paraspinal tenderness, full range of motion Heart:  Regular rate and rhythm Lungs:  Clear to auscultation bilaterally Back: No paraspinal tenderness Skin/Extremities: No rash, no edema Neurological Exam: alert and oriented to person, place, and time. No aphasia or dysarthria. Fund of knowledge is appropriate.  Recent and remote memory are intact.  Attention and concentration are normal.    Able to name objects and repeat phrases. CDT 4/5  MMSE - Mini Mental State Exam 05/07/2018 11/26/2017  Orientation to time 5 4  Orientation to Place 5 5  Registration 3 3  Attention/ Calculation 1 3  Recall 2 1  Language- name 2 objects 2 2  Language- repeat 1 1  Language- follow 3 step command 3 3  Language- read & follow direction 1 1  Write a sentence 1 1  Copy design 1 1  Total score 25 25   Cranial nerves: Pupils equal, round, reactive to light.  Extraocular movements intact with no nystagmus. Visual fields full. Facial sensation intact. No facial asymmetry. Tongue, uvula, palate midline.  Motor: Bulk and tone normal, no cogwheeling, muscle strength 5/5 throughout with no pronator drift.  Sensation to light touch intact.  No extinction  to double simultaneous stimulation.  Deep tendon reflexes 2+ throughout, toes downgoing.  Finger to nose testing intact.  Gait narrow-based and steady, able to tandem walk adequately.  Romberg negative.  IMPRESSION: This is an 83 yo RH man with a history of  hypertension, hyperlipidemia, CAD s/p CABG, 2nd degree AV block s/p PPM, with moderate dementia with behavioral disturbance. MMSE today 25/30 (similar to prior visit in May 2019). He is needing more assistance with daily activities (operating shower knobs). Continue Namenda 10mg  BID, we discussed expectations from dementia medications. We discussed reason for starting the Lexapro, increase dose to 20mg  daily. We discussed the hallucinations, they are not significantly bothersome for him at this time, we have agreed to hold off on antipsychotic medication. Sleep issues appear to be more related to urinary frequency, discuss with Urology. If sleep issues persist despite treatment of urinary issues, we may try Trazodone. He does not drive. He will be spending the winter in [place]. We again discussed the importance of control of vascular risk factors, physical exercise, and brain stimulation exercises for brain health. He will follow-up in 6 months and knows to call for any changes.   Thank you for allowing me to participate in his care.  Please do not hesitate to call for any questions or concerns.  The duration of this appointment visit was 30 minutes of face-to-face time with the patient.  Greater than 50% of this time was spent in counseling, explanation of diagnosis, planning of further management, and coordination of care.   Ellouise Newer, M.D.   CC: Dr. Wolfgang Phoenix

## 2018-06-03 ENCOUNTER — Ambulatory Visit (INDEPENDENT_AMBULATORY_CARE_PROVIDER_SITE_OTHER): Payer: Medicare Other

## 2018-06-03 DIAGNOSIS — I10 Essential (primary) hypertension: Secondary | ICD-10-CM

## 2018-06-03 DIAGNOSIS — I441 Atrioventricular block, second degree: Secondary | ICD-10-CM | POA: Diagnosis not present

## 2018-06-03 NOTE — Progress Notes (Signed)
Remote pacemaker transmission.   

## 2018-06-24 ENCOUNTER — Ambulatory Visit: Payer: Medicare Other | Admitting: Neurology

## 2018-07-18 DIAGNOSIS — J40 Bronchitis, not specified as acute or chronic: Secondary | ICD-10-CM | POA: Diagnosis not present

## 2018-07-24 LAB — CUP PACEART REMOTE DEVICE CHECK
Battery Impedance: 353 Ohm
Battery Voltage: 2.79 V
Brady Statistic AP VS Percent: 46 %
Brady Statistic AS VP Percent: 0 %
Brady Statistic AS VS Percent: 54 %
Date Time Interrogation Session: 20191127134101
Implantable Lead Implant Date: 20130605
Implantable Lead Implant Date: 20130605
Implantable Lead Location: 753859
Implantable Lead Location: 753860
Implantable Lead Model: 5076
Implantable Lead Model: 5076
Lead Channel Impedance Value: 739 Ohm
Lead Channel Pacing Threshold Amplitude: 0.625 V
Lead Channel Pacing Threshold Pulse Width: 0.4 ms
Lead Channel Setting Pacing Amplitude: 2 V
Lead Channel Setting Pacing Pulse Width: 0.4 ms
Lead Channel Setting Sensing Sensitivity: 2.8 mV
MDC IDC MSMT BATTERY REMAINING LONGEVITY: 121 mo
MDC IDC MSMT LEADCHNL RA IMPEDANCE VALUE: 473 Ohm
MDC IDC MSMT LEADCHNL RV PACING THRESHOLD AMPLITUDE: 0.375 V
MDC IDC MSMT LEADCHNL RV PACING THRESHOLD PULSEWIDTH: 0.4 ms
MDC IDC PG IMPLANT DT: 20130605
MDC IDC SET LEADCHNL RA PACING AMPLITUDE: 1.5 V
MDC IDC STAT BRADY AP VP PERCENT: 0 %

## 2018-09-02 ENCOUNTER — Ambulatory Visit (INDEPENDENT_AMBULATORY_CARE_PROVIDER_SITE_OTHER): Payer: Medicare Other | Admitting: *Deleted

## 2018-09-02 DIAGNOSIS — I441 Atrioventricular block, second degree: Secondary | ICD-10-CM | POA: Diagnosis not present

## 2018-09-02 DIAGNOSIS — I10 Essential (primary) hypertension: Secondary | ICD-10-CM

## 2018-09-03 LAB — CUP PACEART REMOTE DEVICE CHECK
Battery Remaining Longevity: 121 mo
Battery Voltage: 2.79 V
Brady Statistic AP VP Percent: 0 %
Brady Statistic AP VS Percent: 49 %
Brady Statistic AS VP Percent: 0 %
Brady Statistic AS VS Percent: 51 %
Date Time Interrogation Session: 20200226150848
Implantable Lead Implant Date: 20130605
Implantable Lead Location: 753859
Lead Channel Pacing Threshold Pulse Width: 0.4 ms
Lead Channel Setting Pacing Amplitude: 1.5 V
Lead Channel Setting Pacing Pulse Width: 0.4 ms
MDC IDC LEAD IMPLANT DT: 20130605
MDC IDC LEAD LOCATION: 753860
MDC IDC MSMT BATTERY IMPEDANCE: 353 Ohm
MDC IDC MSMT LEADCHNL RA IMPEDANCE VALUE: 461 Ohm
MDC IDC MSMT LEADCHNL RA PACING THRESHOLD AMPLITUDE: 0.625 V
MDC IDC MSMT LEADCHNL RA PACING THRESHOLD PULSEWIDTH: 0.4 ms
MDC IDC MSMT LEADCHNL RV IMPEDANCE VALUE: 757 Ohm
MDC IDC MSMT LEADCHNL RV PACING THRESHOLD AMPLITUDE: 0.375 V
MDC IDC PG IMPLANT DT: 20130605
MDC IDC SET LEADCHNL RV PACING AMPLITUDE: 2 V
MDC IDC SET LEADCHNL RV SENSING SENSITIVITY: 2.8 mV

## 2018-09-09 NOTE — Progress Notes (Signed)
Remote pacemaker transmission.   

## 2018-09-18 DIAGNOSIS — R972 Elevated prostate specific antigen [PSA]: Secondary | ICD-10-CM | POA: Diagnosis not present

## 2018-09-18 DIAGNOSIS — N138 Other obstructive and reflux uropathy: Secondary | ICD-10-CM | POA: Diagnosis not present

## 2018-09-18 DIAGNOSIS — N401 Enlarged prostate with lower urinary tract symptoms: Secondary | ICD-10-CM | POA: Diagnosis not present

## 2018-10-22 ENCOUNTER — Ambulatory Visit (INDEPENDENT_AMBULATORY_CARE_PROVIDER_SITE_OTHER): Payer: Medicare Other | Admitting: Family Medicine

## 2018-10-22 ENCOUNTER — Other Ambulatory Visit: Payer: Self-pay

## 2018-10-22 DIAGNOSIS — I10 Essential (primary) hypertension: Secondary | ICD-10-CM | POA: Diagnosis not present

## 2018-10-22 DIAGNOSIS — E785 Hyperlipidemia, unspecified: Secondary | ICD-10-CM | POA: Diagnosis not present

## 2018-10-22 DIAGNOSIS — F0390 Unspecified dementia without behavioral disturbance: Secondary | ICD-10-CM | POA: Diagnosis not present

## 2018-10-22 DIAGNOSIS — E119 Type 2 diabetes mellitus without complications: Secondary | ICD-10-CM

## 2018-10-22 MED ORDER — SIMVASTATIN 80 MG PO TABS: 80.0000 mg | ORAL_TABLET | Freq: Every day | ORAL | 1 refills | Status: DC

## 2018-10-22 MED ORDER — MEMANTINE HCL 10 MG PO TABS: 10.0000 mg | ORAL_TABLET | Freq: Two times a day (BID) | ORAL | 1 refills | Status: DC

## 2018-10-22 MED ORDER — ESCITALOPRAM OXALATE 20 MG PO TABS: 20.0000 mg | ORAL_TABLET | Freq: Every day | ORAL | 1 refills | Status: DC

## 2018-10-22 MED ORDER — METOPROLOL TARTRATE 25 MG PO TABS: 25.0000 mg | ORAL_TABLET | Freq: Two times a day (BID) | ORAL | 1 refills | Status: DC

## 2018-10-22 MED ORDER — ESCITALOPRAM OXALATE 20 MG PO TABS: 20.0000 mg | ORAL_TABLET | Freq: Every day | ORAL | 3 refills | Status: DC

## 2018-10-22 NOTE — Progress Notes (Signed)
   Subjective:    Patient ID: Chad Martinez, male    DOB: [birth year], 84 y.o.   MRN: [id number] Audio plus visual Diabetes  He presents for his follow-up diabetic visit. He has type 2 diabetes mellitus. Risk factors for coronary artery disease include dyslipidemia, diabetes mellitus and hypertension. Current diabetic treatment includes diet. He is compliant with treatment all of the time. His weight is stable. He is following a diabetic diet.   Virtual Visit via Video Note  I connected with Chad Martinez on 10/22/18 at 10:00 AM EDT by a video enabled telemedicine application and verified that I am speaking with the correct person using two identifiers.   I discussed the limitations of evaluation and management by telemedicine and the availability of in person appointments. The patient expressed understanding and agreed to proceed.  History of Present Illness:    Observations/Objective:   Assessment and Plan:   Follow Up Instructions:    I discussed the assessment and treatment plan with the patient. The patient was provided an opportunity to ask questions and all were answered. The patient agreed with the plan and demonstrated an understanding of the instructions.   The patient was advised to call back or seek an in-person evaluation if the symptoms worsen or if the condition fails to improve as anticipated.  I provided 25 minutes of non-face-to-face time during this encounter.   Blood pressure medicine and blood pressure levels reviewed today with patient. Compliant with blood pressure medicine. States does not miss a dose. No obvious side effects. Blood pressure generally good when checked elsewhere. Watching salt intake.   Patient claims compliance with diabetes medication. No obvious side effects. Reports no substantial low sugar spells. Most numbers are generally in good range when checked fasting. Generally does not miss a dose of medication. Watching diabetic diet closely   Patient continues to take lipid medication regularly. No obvious side effects from it. Generally does not miss a dose. Prior blood work results are reviewed with patient. Patient continues to work on fat intake in diet  Dementia.  Clinically stable per family.  Ongoing challenges however.  Depression.  Ongoing medications definitely help   Review of Systems No headache, no major weight loss or weight gain, no chest pain no back pain abdominal pain no change in bowel habits complete ROS otherwise negative     Objective:   Physical Exam    Virtual visit    Assessment & Plan:  Impression 1 type 2 diabetes.  Sugars good control.  Discussed compliance with diet discussed.  2.  Hypertension.  Blood pressure stable good control to maintain same  3.  Hyperlipidemia.  Ongoing importance of medication discussed  4.  Chronic dementia clinically stable to maintain Namenda  Follow-up in 6 months for wellness plus chronic blood work then.

## 2018-10-25 ENCOUNTER — Encounter: Payer: Self-pay | Admitting: Family Medicine

## 2018-11-09 ENCOUNTER — Telehealth: Payer: Self-pay | Admitting: Neurology

## 2018-11-09 NOTE — Telephone Encounter (Signed)
Spoke with pt wife at this time she would like to still hold off on medication, I will get her a family packet in the mail today, she stated that at this time the hallucinations at not bothersome and are not causing anxiety, pt wife informed if anything changes to please give [place] a call back

## 2018-11-09 NOTE — Telephone Encounter (Signed)
Patient's wife called and is needing to let Dr. Delice Lesch know that Chad Martinez is seeing things inside and outside their house. He is seeing Animals and people that are not there. She said she didn't know if the Namenda or the Lexapro could be causing this to happen? Please Call. Thanks

## 2018-11-09 NOTE — Telephone Encounter (Signed)
Pls let wife know that unfortunately hallucinations are part of progression of his dementia. We had discussed this on their last visit, if they are not significantly bothersome for him or inducing anxiety, we agreed to hold off on medication. There are medications that can quiet them down some but not completely take them away, would she like to try one on him? We can also send them the dementia folder/packet where it talks about Behaviors in dementia and what can be done to help at home, if she would like. Thanks

## 2018-11-10 ENCOUNTER — Telehealth: Payer: Self-pay | Admitting: Cardiovascular Disease

## 2018-11-10 DIAGNOSIS — I4510 Unspecified right bundle-branch block: Secondary | ICD-10-CM | POA: Diagnosis not present

## 2018-11-10 DIAGNOSIS — R4020 Unspecified coma: Secondary | ICD-10-CM | POA: Diagnosis not present

## 2018-11-10 DIAGNOSIS — I10 Essential (primary) hypertension: Secondary | ICD-10-CM | POA: Diagnosis not present

## 2018-11-10 NOTE — Telephone Encounter (Signed)
New message   Patient's wife states that the patient got shocked by his device. Please call to discuss.

## 2018-11-10 NOTE — Telephone Encounter (Signed)
Spoke w/ pt wife and she stated that pt had an episode this morning where he was unresponsive. No chest pain, no shortness of breath, no dizziness reported by the pt wife. She stated that pt was sitting during the entire episode.  She called EMS and they came and assessed the patient. Patient wife states that pt feels fine now. He ate a good breakfast and is walking around now. Patient wife reports that she gives patient all of his medicines every day and they do no miss a dose. Instructed her to send a manual transmission w/ the home monitor. Once the transmission is received it will reviewed and someone will call back.

## 2018-11-10 NOTE — Telephone Encounter (Signed)
Pt has a PPM. Remote transmission reviewed. Normal device function. Lead trends stable. No VHR episodes. 4 mode switches (<0.1%)--no EGMs due to duration <30sec. Ap 49%, Vp <1% (with MVP on). Presenting rhythm: Ap/Vs @83bpm .  Routed to NL triage for further assessment as pacemaker function is stable with no episodes to correlate with symptoms.

## 2018-11-10 NOTE — Telephone Encounter (Signed)
Spoke with pt wife, aware everything with the PPM is normal. He is feeling okay now, she reports he is wabble and is having a little dizziness. His bp with EMS was 200/90. She can not check his bp at home. Will forward to dr croitoru to review and advise

## 2018-11-10 NOTE — Telephone Encounter (Signed)
Please tell him that I was available to review [facility] pacemaker download and everything looks fine. His device is not capable of shocking him.  It is a pacemaker, not a defibrillator. All the device parameters are normal (the device does self checks on the leads every day). He is not "pacemaker dependent".  In other words if for some reason the pacemaker were to malfunction it would be very unlikely for him to pass out.  He never uses the device to pace the bottom chamber of the heart. No meaningful rapid rhythm abnormalities have been recorded.  Having said all that, the only likely cardiac consciousness would be if his blood pressure was very low.  Not withstanding his current elevated blood pressure, some of his medicines can make him prone to orthostatic hypotension.  It would be very helpful if they had a blood pressure machine at home so they could check his blood pressure when he has symptoms.  Another possibility is that this is not cardiovascular, but neurological and that he had an atypical seizure.  At this point I do not see a reason to bring him into the cardiology office (especially considering the current pandemic circumstances), but I do encourage him to get a blood pressure cuff and call us back if he has any further events.  MCr

## 2018-11-10 NOTE — Telephone Encounter (Signed)
Spoke with pt wife, she has brought a bp monitor and will check his bp and let us know if he were to have another episode.

## 2018-11-12 ENCOUNTER — Other Ambulatory Visit: Payer: Self-pay

## 2018-11-12 MED ORDER — NITROGLYCERIN 0.4 MG SL SUBL: 0.4000 mg | SUBLINGUAL_TABLET | SUBLINGUAL | 3 refills | Status: DC | PRN

## 2018-11-13 ENCOUNTER — Telehealth: Payer: Self-pay | Admitting: Cardiovascular Disease

## 2018-11-13 DIAGNOSIS — Z79899 Other long term (current) drug therapy: Secondary | ICD-10-CM

## 2018-11-13 MED ORDER — LISINOPRIL 5 MG PO TABS: 5.0000 mg | ORAL_TABLET | Freq: Every day | ORAL | 3 refills | Status: DC

## 2018-11-13 NOTE — Telephone Encounter (Signed)
Spoke with pt wife and informed of Dr. Gwenlyn Found recommendations:   "Can probably add Lisinopril 5 mg and check a BMET in 7 days. Daily BP log then have Kristen see virtually in 4 weeks."  Pt wife verbalized understanding. She will inform pt of new med, have pt present for lab work in 7 days, and keep BP log. Informed that pharmD BP log usually BID BPs so can check BP BID. Lab slip to be sent via mail. Recall for pharmD appt in Epic. Encounter routed to pharmd for FYI of appt

## 2018-11-13 NOTE — Telephone Encounter (Signed)
Spoke to wife regarding patient BP- she states they took his BP and this morning they took it was 197/109, then took about 11:30 after 3 hours after taking medications- it was 159/95, they state it has been like this since Tuesday.  denies chest pain, dizziness, sob, changes in diet, does complain of weakness when walking since Tuesday.   They would like to know if any changes should be made to medications. Advised I would route a message to MD, and PharmD to advise.

## 2018-11-13 NOTE — Telephone Encounter (Signed)
Can probably add Lisinopril 5 mg and check a BMET in 7 days. Daily BP log then have Kristen see virtually in 4 weeks.  JJB

## 2018-11-13 NOTE — Telephone Encounter (Signed)
Pt c/o BP issue: STAT if pt c/o blurred vision, one-sided weakness or slurred speech  1. What are your last 5 BP readings? Pt is still having problems , talked to a nurse on Tuesday- This morning it was 197/109 and he took his medicine  and she just took his reading it was 159/95   2. Are you having any other symptoms (ex. Dizziness, headache, blurred vision, passed out)? no  3. What is your BP issue? Blood Pressure is still high

## 2018-11-16 NOTE — Telephone Encounter (Signed)
Noted, will reach out to patient first week of June

## 2018-11-23 DIAGNOSIS — Z79899 Other long term (current) drug therapy: Secondary | ICD-10-CM | POA: Diagnosis not present

## 2018-11-23 LAB — BASIC METABOLIC PANEL
BUN/Creatinine Ratio: 16 (ref 10–24)
BUN: 17 mg/dL (ref 8–27)
CO2: 23 mmol/L (ref 20–29)
Calcium: 8.9 mg/dL (ref 8.6–10.2)
Chloride: 100 mmol/L (ref 96–106)
Creatinine, Ser: 1.05 mg/dL (ref 0.76–1.27)
GFR calc Af Amer: 77 mL/min/{1.73_m2} (ref >59)
GFR calc non Af Amer: 66 mL/min/{1.73_m2} (ref >59)
Glucose: 254 mg/dL — ABNORMAL HIGH (ref 65–99)
Potassium: 4.3 mmol/L (ref 3.5–5.2)
Sodium: 138 mmol/L (ref 134–144)

## 2018-12-03 ENCOUNTER — Ambulatory Visit (INDEPENDENT_AMBULATORY_CARE_PROVIDER_SITE_OTHER): Payer: Medicare Other | Admitting: *Deleted

## 2018-12-03 DIAGNOSIS — I441 Atrioventricular block, second degree: Secondary | ICD-10-CM | POA: Diagnosis not present

## 2018-12-04 LAB — CUP PACEART REMOTE DEVICE CHECK
Battery Impedance: 377 Ohm
Battery Remaining Longevity: 118 mo
Battery Voltage: 2.79 V
Brady Statistic AP VP Percent: 0 %
Brady Statistic AP VS Percent: 49 %
Brady Statistic AS VP Percent: 0 %
Brady Statistic AS VS Percent: 51 %
Date Time Interrogation Session: 20200528213158
Implantable Lead Implant Date: 20130605
Implantable Lead Implant Date: 20130605
Implantable Lead Location: 753859
Implantable Lead Location: 753860
Implantable Lead Model: 5076
Implantable Lead Model: 5076
Implantable Pulse Generator Implant Date: 20130605
Lead Channel Impedance Value: 442 Ohm
Lead Channel Impedance Value: 776 Ohm
Lead Channel Pacing Threshold Amplitude: 0.375 V
Lead Channel Pacing Threshold Amplitude: 0.625 V
Lead Channel Pacing Threshold Pulse Width: 0.4 ms
Lead Channel Pacing Threshold Pulse Width: 0.4 ms
Lead Channel Setting Pacing Amplitude: 1.5 V
Lead Channel Setting Pacing Amplitude: 2 V
Lead Channel Setting Pacing Pulse Width: 0.4 ms
Lead Channel Setting Sensing Sensitivity: 2.8 mV

## 2018-12-09 ENCOUNTER — Encounter: Payer: Self-pay | Admitting: Cardiology

## 2018-12-09 ENCOUNTER — Telehealth: Payer: Self-pay

## 2018-12-09 DIAGNOSIS — I10 Essential (primary) hypertension: Secondary | ICD-10-CM

## 2018-12-09 NOTE — Telephone Encounter (Signed)
Pt's spouse stated that dr berry has referred the pt for a htn visit that they need to get scheduled will route to PharmD for further instruction. I told the pt that they will more than likely have a phone visit.

## 2018-12-09 NOTE — Progress Notes (Signed)
Remote pacemaker transmission.   

## 2018-12-10 NOTE — Telephone Encounter (Signed)
Patient not at home at this time. Will like to complete telephone follow up on Monday 12/14/2018 around 11am.

## 2018-12-14 MED ORDER — LISINOPRIL 20 MG PO TABS: 20.0000 mg | ORAL_TABLET | Freq: Every day | ORAL | 1 refills | Status: DC

## 2018-12-14 NOTE — Telephone Encounter (Signed)
Telephone follow up  XBJ:YNWG Chad Martinez is a 84 y.o. male referred by Dr. Gwenlyn Found to HTN clinic. PMH includes second degree AV block, CABG x 6, hypertension, and hyperlipidemia. Patient denies dizziness, swelling, chest pain, blurry vision or any other problems at this time. Lisinopril 5mg  daily was started 5 weeks ago per DR Berry's orders BMET showing stable renal function.  Current HTN meds:  Lisinopril 5mg  daily Metoprolol 25mg  BID  BP goal: 130/80  Home BP readings:   May/25: 167/98 ; 161/94 May/26: 210/111; 205/93 May/27 179/100; 172/94 May/28 164/92; 170/92 May/29 186/103; 170/92 May/ 30 185/97; 138/77  May/31 198/110; 160/90 June/1 165/99; 158/94 June/2 172/105; 177/93 June/3 166/94; 142/80 June/4 178/106; 154/77 June/5 164/101; 162/90 Jun/6 168/99; 136/78 June/7 177/100; 171/93 June/8 167/99   Wt Readings from Last 3 Encounters:  05/07/18 194 lb (88 kg)  04/28/18 193 lb 6.4 oz (87.7 kg)  03/18/18 187 lb 9.6 oz (85.1 kg)   BP Readings from Last 3 Encounters:  05/07/18 (!) 182/94  04/28/18 136/80  03/18/18 (!) 142/72   Pulse Readings from Last 3 Encounters:  05/07/18 77  03/18/18 82  02/22/18 72    Assessment and Plan: Patient tolerating medication without problems ,but BP remains significantly above desire goal of <130/80.  Will increase lisinopril to 10mg  (2 tablets of lisinopril 5mg ) daily until 6/14, then start lisinopril 20mg  daily. Repeat BMET in 2 weeks and monitor BP twice daily. Plan to follow up with HTN clinic in July/2020 , but patent encouraged to call if additional assistance needed prior to appointment.  Time spent: 15 minutes  Chad Martinez PharmD, BCPS, [facility] [address] [place] [address] 12/14/2018 1:15 PM

## 2018-12-14 NOTE — Addendum Note (Signed)
Addended by: Harrington Challenger on: 12/14/2018 01:23 PM   Modules accepted: Orders

## 2018-12-14 NOTE — Telephone Encounter (Signed)
Follow up     Chad Martinez is calling to leave BP readings  BP has been running  170-180/90-103     139/72 was a week ago Saturday

## 2018-12-22 ENCOUNTER — Ambulatory Visit: Payer: Medicare Other | Admitting: Neurology

## 2018-12-31 ENCOUNTER — Other Ambulatory Visit: Payer: Self-pay

## 2018-12-31 ENCOUNTER — Telehealth (INDEPENDENT_AMBULATORY_CARE_PROVIDER_SITE_OTHER): Payer: Medicare Other | Admitting: Neurology

## 2018-12-31 DIAGNOSIS — R251 Tremor, unspecified: Secondary | ICD-10-CM

## 2018-12-31 DIAGNOSIS — F03B18 Unspecified dementia, moderate, with other behavioral disturbance: Secondary | ICD-10-CM

## 2018-12-31 DIAGNOSIS — F0391 Unspecified dementia with behavioral disturbance: Secondary | ICD-10-CM | POA: Diagnosis not present

## 2018-12-31 MED ORDER — ESCITALOPRAM OXALATE 20 MG PO TABS: 20.0000 mg | ORAL_TABLET | Freq: Every day | ORAL | 3 refills | Status: DC

## 2018-12-31 MED ORDER — MEMANTINE HCL 10 MG PO TABS: 10.0000 mg | ORAL_TABLET | Freq: Two times a day (BID) | ORAL | 3 refills | Status: DC

## 2018-12-31 NOTE — Progress Notes (Addendum)
Virtual Visit via Video Note The purpose of this virtual visit is to provide medical care while limiting exposure to the novel coronavirus.    Consent was obtained for video visit:  Yes.   Answered questions that patient had about telehealth interaction:  Yes.   I discussed the limitations, risks, security and privacy concerns of performing an evaluation and management service by telemedicine. I also discussed with the patient that there may be a patient responsible charge related to this service. The patient expressed understanding and agreed to proceed.  Pt location: Home Physician Location: office Name of referring provider:  Mikey Kirschner, MD I connected with Chad Martinez at patients initiation/request on 12/31/2018 at 10:00 AM EDT by video enabled telemedicine application and verified that I am speaking with the correct person using two identifiers. Pt MRN:  [id number] Pt DOB:  Oct 10, 1934 Video Participants:  Chad Martinez;  Lanny Cramp (spouse), Fayrene Helper (daughter)   History of Present Illness:  The patient was last seen in October 2019 for moderate dementia with behavioral disturbance. MMSE 25/30 in October 2019 and May 2019). He is taking Memantine 10mg  BID, family felt he was having more hallucinations on Donepezil. He is also on Lexapro 20mg  daily. He continues to have hallucinations, seeing animals outside the house and people inside the house. He thinks there is 3 of his wife, he asks her "when is Nevin Bloodgood coming home?" The hallucinations are not scary for him. With the isolation occurring due to current pandemic, family is noticing more issues. He gets disoriented in the house looking for the bathroom. He would stand in front of the door and say there are 4 people in front of him to the bathroom. He sleeps fairly well. No wandering behavior. He is needing more help dressing, he puts clothes on backwards. He can bathe himself but he cannot adjust the water. He states mood is  great, his wife reports he gets agitated more easily sometimes.   They report an incident 2 months ago where he was unresponsive for over an hour. He was sitting on the chair with his eyes closed, not answering questions. It looked like he was asleep but he was not per family. When his daughter took his hand and asked him to squeeze, his whole body shook "not like a seizure" per family but his feet and hands moved, whole body shook for a minute. His daughter thought that it may have been a reaction to her talking to him, he did the same thing when his son talked to him. When he came to, he was not fully aware of what happened and said he felt a shock. They called his cardiologist and pacemaker was interrogated, no changes seen. His BP has been elevated, as high as 200/110, lowest is 155/80. He denies any olfactory/gustatory hallucinations, focal numbness/tingling/weakness, myoclonic jerks. He denies any headaches, dizziness, vision changes. He seems to get choked a lot when drinking liquids, like soup.   History on Initial Assessment 11/26/2017: This is a pleasant 84 year old right-handed man with a history of hypertension, hyperlipidemia, CAD s/p CABG, 2nd degree AV block s/p PPM, presenting for evaluation of dementia. He feels his memory has been getting worse and going downhill over the past year. Family started noticing changes around 1-1.5 years ago, he was initially leaving doors and drawers open, or leave the water running. Now he forgets to flush the toilet. His daughter first noticed that he was more quiet. He has always  been very jovial and talks to everybody, but now he does not talk as much. He stopped driving in April 2229 after he went the wrong way and got confused, they attribute this to his vision. His wife has always been in charge of finances and medications, but now has to remind him to take his medications because he would get confused about AM/PM medications. He likes to play Bingo but  recently has had difficulty. His daughter also noticed that he would have slower processing/understanding when she talks to him. He follows instructions fine. There have been other behavioral changes as well, he is more anxious, pacing around the house. One time he got up at 2AM and showered, then the next morning recalled he took a shower and did not know why he did it. Over the past month he has had visual hallucinations where he would see people in the woods behind their house. No auditory component. He is able to dress and bathe independently. He had an MMSE at PCP office in March 2019 which was 22/30 (24/30 in the summer).  His wife has noticed the past week that he seems to be slurring his words, his daughter describes it as difficulty finding the right word. He had urinary issues last July with urinary retention requiring self-catheterization, this has improved but since March he has had urinary incontinence and wears pull ups at night and when going out. He denies any headaches, dizziness, diplopia, dysphagia, neck/back pain, focal numbness/tingling/weakness, bowel dysfunction, anosmia, or tremors. He fell from their house 5 years ago and was found to have AV block with pacemaker placed. No family history of dementia. No history of significant head injuries or alcohol intake.   I personally reviewed head CT without contrast done 10/07/17 which did not show any acute changes, there was mild diffuse atrophy and chronic microvascular disease.     Current Outpatient Medications on File Prior to Visit  Medication Sig Dispense Refill   aspirin EC 81 MG tablet Take 81 mg by mouth daily.     finasteride (PROSCAR) 5 MG tablet Take 1 tablet by mouth daily.     glucose blood test strip Tests sugar once a day- DX E11.9 50 each prn   lisinopril (ZESTRIL) 20 MG tablet Take 1 tablet (20 mg total) by mouth daily. 30 tablet 1   metoprolol tartrate (LOPRESSOR) 25 MG tablet Take 1 tablet (25 mg total) by  mouth 2 (two) times daily. 180 tablet 1   nitroGLYCERIN (NITROSTAT) 0.4 MG SL tablet Place 1 tablet (0.4 mg total) under the tongue every 5 (five) minutes as needed for chest pain (x 3 doses). (Patient not taking: Reported on 12/29/2018) 25 tablet 3   simvastatin (ZOCOR) 80 MG tablet Take 1 tablet (80 mg total) by mouth at bedtime. 90 tablet 1   tamsulosin (FLOMAX) 0.4 MG CAPS capsule Take 1 capsule (0.4 mg total) by mouth daily. 30 capsule 0   No current facility-administered medications on file prior to visit.      Observations/Objective:   GEN:  The patient appears stated age and is in NAD.  Neurological examination: Patient is awake, alert, oriented x 2. No aphasia or dysarthria. Reduced fluency, able to follow commands. Remote and recent memory impaired. Able to name and repeat. Cranial nerves: Extraocular movements intact with no nystagmus. No facial asymmetry. Motor: moves all extremities symmetrically, at least anti-gravity x 4. No incoordination on finger to nose testing. Gait: narrow-based and steady, able to tandem walk adequately. Negative  Romberg test.  MMSE - Mini Mental State Exam 12/31/2018 05/07/2018 11/26/2017  Orientation to time 0 5 4  Orientation to Place 5 5 5   Registration 3 3 3   Attention/ Calculation 1 1 3   Recall 0 2 1  Language- name 2 objects 2 2 2   Language- repeat 1 1 1   Language- follow 3 step command 2 3 3   Language- read & follow direction 1 1 1   Write a sentence 1 1 1   Copy design 0 1 1  Total score 16 25 25      Assessment and Plan:   This is an 84 yo RH man with a history of  hypertension, hyperlipidemia, CAD s/p CABG, 2nd degree AV block s/p PPM, with moderate dementia with behavioral disturbance. MMSE today 16/30 (25/30 in October 2019 and May 2019). Continue Memantine 10mg  BID and Lexapro 20mg  daily. He continues to have visual hallucinations but these are non-threatening, we have agreed to hold off on medication for now. Information about dealing  with behavioral changes in dementia will be sent to family. They report an unusual episode 2 months ago of unresponsiveness lasting an hour with shaking when stimulated, no abnormalities when pacemaker was interrogated. EEG will be ordered. He does not drive. Continue 24/7 care. He will follow-up in 6 months and knows to call for any changes.    Follow Up Instructions:    -I discussed the assessment and treatment plan with the patient and family. The patient and family were provided an opportunity to ask questions and all were answered. The patient/family agreed with the plan and demonstrated an understanding of the instructions.   The patient was advised to call back or seek an in-person evaluation if the symptoms worsen or if the condition fails to improve as anticipated.    Total Time spent in visit with the patient was:  26 minutes, of which more than 50% of the time was spent in counseling and/or coordinating care on the above.   Pt understands and agrees with the plan of care outlined.     Cameron Sprang, MD

## 2019-01-04 ENCOUNTER — Other Ambulatory Visit: Payer: Self-pay

## 2019-01-04 ENCOUNTER — Ambulatory Visit (INDEPENDENT_AMBULATORY_CARE_PROVIDER_SITE_OTHER): Payer: Medicare Other | Admitting: Neurology

## 2019-01-04 DIAGNOSIS — R251 Tremor, unspecified: Secondary | ICD-10-CM

## 2019-01-05 DIAGNOSIS — I10 Essential (primary) hypertension: Secondary | ICD-10-CM | POA: Diagnosis not present

## 2019-01-06 LAB — BASIC METABOLIC PANEL
BUN/Creatinine Ratio: 16 (ref 10–24)
BUN: 18 mg/dL (ref 8–27)
CO2: 27 mmol/L (ref 20–29)
Calcium: 9.2 mg/dL (ref 8.6–10.2)
Chloride: 103 mmol/L (ref 96–106)
Creatinine, Ser: 1.16 mg/dL (ref 0.76–1.27)
GFR calc Af Amer: 68 mL/min/{1.73_m2} (ref >59)
GFR calc non Af Amer: 59 mL/min/{1.73_m2} — ABNORMAL LOW (ref >59)
Glucose: 172 mg/dL — ABNORMAL HIGH (ref 65–99)
Potassium: 4.5 mmol/L (ref 3.5–5.2)
Sodium: 143 mmol/L (ref 134–144)

## 2019-01-12 ENCOUNTER — Telehealth: Payer: Self-pay

## 2019-01-12 NOTE — Telephone Encounter (Signed)

## 2019-01-13 ENCOUNTER — Telehealth: Payer: Self-pay | Admitting: Neurology

## 2019-01-13 NOTE — Telephone Encounter (Signed)
Wife is calling in needing EEG results. Thanks!

## 2019-01-13 NOTE — Telephone Encounter (Signed)
Informed wife that results have not been read yet by Dr. Delice Lesch and that we would call when she has. Wife understood.

## 2019-01-14 ENCOUNTER — Telehealth: Payer: Self-pay

## 2019-01-14 NOTE — Telephone Encounter (Signed)
-----   Message from Cameron Sprang, MD sent at 01/14/2019 12:00 PM EDT ----- Pls let daughter know that the brain wave test did not show any seizure discharges. It showed mild slowing of the brain waves, which is due to his underlying dementia. Call if any change in symptoms. Thanks

## 2019-01-14 NOTE — Telephone Encounter (Signed)
Pts wife informed of EEG results and to call the office with any concerns. Wife states that pt needed to make a 6 month follow up. Appt made 08/16/18 at 4:00. There were no appts in Jan

## 2019-01-14 NOTE — Procedures (Signed)
ELECTROENCEPHALOGRAM REPORT  Date of Study: 01/04/2019  Patient's Name: Chad Martinez MRN: [id number] Date of Birth: December 11, 1934  Referring Provider: Dr. Ellouise Newer  Clinical History: This is an 84 year old man with an episode of unresponsiveness lasting an hour with shaking when stimulated, no abnormalities when pacemaker was interrogated.  Medications: ZOCOR 80 MG tablet LOPRESSOR 25 MG tablet ZESTRIL 20 MG tablet PROSCAR 5 MG tablet aspirin EC 81 MG tablet Memantine 10mg  Lexapro 20mg   Technical Summary: A multichannel digital 1-hour EEG recording measured by the international 10-20 system with electrodes applied with paste and impedances below 5000 ohms performed as portable with EKG monitoring in an awake and asleep patient.  Hyperventilation was not performed. Photic stimulation was performed.  The digital EEG was referentially recorded, reformatted, and digitally filtered in a variety of bipolar and referential montages for optimal display.   Description: The patient is awake and asleep during the recording.  During maximal wakefulness, there is a symmetric, medium voltage 7 Hz posterior dominant rhythm that attenuates with eye opening. This is admixed with a small amount of diffuse 5-6 Hz theta slowing of the waking background.  During drowsiness and sleep, there is an increase in theta and delta slowing of the background with occasional vertex waves seen.  Photic stimulation did not elicit any abnormalities.  There were no epileptiform discharges or electrographic seizures seen.    EKG lead was unremarkable.  Impression: This 1-hour awake and asleep EEG is abnormal due to mild diffuse slowing of the waking background with slowing of the posterior dominant rhythm.  Clinical Correlation of the above findings indicates diffuse cerebral dysfunction that is non-specific in etiology and can be seen with hypoxic/ischemic injury, toxic/metabolic encephalopathies, neurodegenerative  disorders, or medication effect.  The absence of epileptiform discharges does not rule out a clinical diagnosis of epilepsy.  Clinical correlation is advised.   Ellouise Newer, M.D.

## 2019-01-19 ENCOUNTER — Ambulatory Visit (INDEPENDENT_AMBULATORY_CARE_PROVIDER_SITE_OTHER): Payer: Medicare Other | Admitting: Pharmacist Clinician (PhC)/ Clinical Pharmacy Specialist

## 2019-01-19 ENCOUNTER — Other Ambulatory Visit: Payer: Self-pay

## 2019-01-19 VITALS — BP 148/78 | HR 70

## 2019-01-19 DIAGNOSIS — I10 Essential (primary) hypertension: Secondary | ICD-10-CM

## 2019-01-19 MED ORDER — LISINOPRIL 20 MG PO TABS: 20.0000 mg | ORAL_TABLET | Freq: Every day | ORAL | 3 refills | Status: DC

## 2019-01-19 MED ORDER — CHLORTHALIDONE 25 MG PO TABS: 25.0000 mg | ORAL_TABLET | Freq: Every day | ORAL | 3 refills | Status: DC

## 2019-01-19 NOTE — Progress Notes (Signed)
01/19/2019 [name] Jul 16, 1934 [id number]   HPI:  Chad Martinez is a 84 y.o. male patient of Dr Chad Martinez, with a PMH below who presents today for hypertension clinic evaluation.  In addition to hypertension, his medical history is significant for hyperlipidemia, second degree AV block, and CABG x 6 (2008).  In May patient called to report elevated blood pressure and was started on lisinopril 5 mg daily.  Metabolic panel drawn shortly thereafter was stable.  In follow up with Chad Martinez at the first of June, he was still noting readings as high as 850 systolic, but a 14 day average of  177/101 in the mornings and 163/88 in the evenings.  His home BP cuff has not been validated.  At that time he was asked to increase the lisinopril to 10 mg daily for 10 days then start 20 mg daily.    Patient returns today for follow up.  Takes lisinopril at bedtime, hoping to get am readings down, but little success  Blood Pressure Goal:  130/80  Current Medications:  Family Hx: no CVD in parents or siblings, one brother died from lung cancer, sister healthy  Social Hx: no tobacco, quit 45 years ago; no alcohol; coffee 2-3 cups, sweet tea with meals (2)  Diet: low sodium, mostly home cooked; fresh veggies; no dietary restrictions; occasional bacon/eggs, biscuits and gravy  Exercise: walks, 5-10 minutes on foot pedals  Home BP readings: checks 1-3 times per day, occasional WNL but mostly 277-412 systolic first in am usually 190/100 range, lower end 160"s.  Home cuff omron, bought in May this year, when first had noted hypertension  Intolerances: sulfa  Labs:    Wt Readings from Last 3 Encounters:  12/29/18 195 lb (88.5 kg)  05/07/18 194 lb (88 kg)  04/28/18 193 lb 6.4 oz (87.7 kg)   BP Readings from Last 3 Encounters:  01/19/19 (!) 148/78  12/29/18 (!) 155/85  05/07/18 (!) 182/94   Pulse Readings from Last 3 Encounters:  01/19/19 70  05/07/18 77  03/18/18 82    Current  Outpatient Medications  Medication Sig Dispense Refill  . aspirin EC 81 MG tablet Take 81 mg by mouth daily.    Marland Kitchen escitalopram (LEXAPRO) 20 MG tablet Take 1 tablet (20 mg total) by mouth daily. 90 tablet 3  . finasteride (PROSCAR) 5 MG tablet Take 1 tablet by mouth daily.    Marland Kitchen glucose blood test strip Tests sugar once a day- DX E11.9 50 each prn  . memantine (NAMENDA) 10 MG tablet Take 1 tablet (10 mg total) by mouth 2 (two) times daily. 180 tablet 3  . metoprolol tartrate (LOPRESSOR) 25 MG tablet Take 1 tablet (25 mg total) by mouth 2 (two) times daily. 180 tablet 1  . nitroGLYCERIN (NITROSTAT) 0.4 MG SL tablet Place 1 tablet (0.4 mg total) under the tongue every 5 (five) minutes as needed for chest pain (x 3 doses). 25 tablet 3  . simvastatin (ZOCOR) 80 MG tablet Take 1 tablet (80 mg total) by mouth at bedtime. 90 tablet 1  . tamsulosin (FLOMAX) 0.4 MG CAPS capsule Take 1 capsule (0.4 mg total) by mouth daily. 30 capsule 0  . chlorthalidone (HYGROTON) 25 MG tablet Take 1 tablet (25 mg total) by mouth daily. 90 tablet 3  . lisinopril (ZESTRIL) 20 MG tablet Take 1 tablet (20 mg total) by mouth daily. 90 tablet 3   No current facility-administered medications for this visit.  Allergies  Allergen Reactions  . Bactrim [Sulfamethoxazole-Trimethoprim] Rash    Past Medical History:  Diagnosis Date  . Colon polyps   . Coronary artery disease    s/p CABG x 6  . Detached retina    Partial  . Elevated PSA   . Enlarged prostate   . Hyperlipidemia   . Hypertension   . Pacemaker   . Pneumothorax 12/10/2011   right rib fx after fall from ladder  . Reflux   . Syncope    Permanent Pacemaker 12/11/2011 MDT [facility]    Blood pressure (!) 148/78, pulse 70.  (172/90  70 with home meter)  HTN (hypertension) Patient with poorly controlled hypertension, only on lisinopril 20 mg and metoprolol 25 mg bid.  Home meter is reading about 24/12 points higher than office.  Home meter is new, Omron device.   Patient and wife were given instruction on proper technique and testing.  He will start chlorthalidone 25 mg once daily in addition to lisinopril, and will continue with regular home BP checks.  Repeat BMET in 10-14 days.  He returns in 3 weeks for a follow up visit with Dr. Gwenlyn Martinez.  He was asked to bring his cuff back in at that time for second check.  If at that time home meter is still "off", would suggest he contact [employer] for replacement.     Chad Martinez PharmD CPP [facility] [address] [place], [place] [address] [phone]

## 2019-01-19 NOTE — Patient Instructions (Signed)
Return for a a follow up appointment with Dr. Gwenlyn Found in August  Go to the lab in 10-14 days  Your blood pressure today is 148/78  (172/90 by home meter)   Check your blood pressure at home twice daily and keep record of the readings.  Take your BP meds as follows:  AM: chlorthalidone 25 mg   PM: lisinopril 20 mg  Bring all of your meds, your BP cuff and your record of home blood pressures to your next appointment.  Exercise as you're able, try to walk approximately 30 minutes per day.  Keep salt intake to a minimum, especially watch canned and prepared boxed foods.  Eat more fresh fruits and vegetables and fewer canned items.  Avoid eating in fast food restaurants.    HOW TO TAKE YOUR BLOOD PRESSURE: . Rest 5 minutes before taking your blood pressure. .  Don't smoke or drink caffeinated beverages for at least 30 minutes before. . Take your blood pressure before (not after) you eat. . Sit comfortably with your back supported and both feet on the floor (don't cross your legs). . Elevate your arm to heart level on a table or a desk. . Use the proper sized cuff. It should fit smoothly and snugly around your bare upper arm. There should be enough room to slip a fingertip under the cuff. The bottom edge of the cuff should be 1 inch above the crease of the elbow. . Ideally, take 3 measurements at one sitting and record the average.

## 2019-01-19 NOTE — Assessment & Plan Note (Signed)
Patient with poorly controlled hypertension, only on lisinopril 20 mg and metoprolol 25 mg bid.  Home meter is reading about 24/12 points higher than office.  Home meter is new, [employer] device.  Patient and wife were given instruction on proper technique and testing.  He will start chlorthalidone 25 mg once daily in addition to lisinopril, and will continue with regular home BP checks.  Repeat BMET in 10-14 days.  He returns in 3 weeks for a follow up visit with Dr. Gwenlyn Found.  He was asked to bring his cuff back in at that time for second check.  If at that time home meter is still "off", would suggest he contact [employer] for replacement.

## 2019-02-02 DIAGNOSIS — I10 Essential (primary) hypertension: Secondary | ICD-10-CM | POA: Diagnosis not present

## 2019-02-03 LAB — BASIC METABOLIC PANEL
BUN/Creatinine Ratio: 18 (ref 10–24)
BUN: 22 mg/dL (ref 8–27)
CO2: 28 mmol/L (ref 20–29)
Calcium: 9 mg/dL (ref 8.6–10.2)
Chloride: 99 mmol/L (ref 96–106)
Creatinine, Ser: 1.19 mg/dL (ref 0.76–1.27)
GFR calc Af Amer: 66 mL/min/{1.73_m2} (ref >59)
GFR calc non Af Amer: 57 mL/min/{1.73_m2} — ABNORMAL LOW (ref >59)
Glucose: 213 mg/dL — ABNORMAL HIGH (ref 65–99)
Potassium: 4.7 mmol/L (ref 3.5–5.2)
Sodium: 143 mmol/L (ref 134–144)

## 2019-02-09 ENCOUNTER — Telehealth: Payer: Self-pay | Admitting: Cardiovascular Disease

## 2019-02-09 NOTE — Telephone Encounter (Signed)
  Wife is calling because she will need to be with Chad Martinez for his appt on 02/12/19 @ 3:15 due to his demenia

## 2019-02-09 NOTE — Telephone Encounter (Signed)
FYI- patient wife needs to come to appointment with patient tomorrow with Dr.Berry. Thanks!

## 2019-02-12 ENCOUNTER — Encounter: Payer: Self-pay | Admitting: Cardiovascular Disease

## 2019-02-12 ENCOUNTER — Other Ambulatory Visit: Payer: Self-pay

## 2019-02-12 ENCOUNTER — Ambulatory Visit (INDEPENDENT_AMBULATORY_CARE_PROVIDER_SITE_OTHER): Payer: Medicare Other | Admitting: Cardiovascular Disease

## 2019-02-12 DIAGNOSIS — E782 Mixed hyperlipidemia: Secondary | ICD-10-CM | POA: Diagnosis not present

## 2019-02-12 DIAGNOSIS — Z951 Presence of aortocoronary bypass graft: Secondary | ICD-10-CM | POA: Diagnosis not present

## 2019-02-12 DIAGNOSIS — I10 Essential (primary) hypertension: Secondary | ICD-10-CM

## 2019-02-12 DIAGNOSIS — Z950 Presence of cardiac pacemaker: Secondary | ICD-10-CM

## 2019-02-12 NOTE — Assessment & Plan Note (Addendum)
History of CAD status post CABG x6 by Dr. Arvid Right 04/20/2007.  His last functional study performed 12/21/2015 was low risk.  He denies chest pain.

## 2019-02-12 NOTE — Assessment & Plan Note (Signed)
History of essential hypertension with blood pressure measured today 130/60.  The pharmacist have been adjusting his medications with the recent addition of chlorthalidone which is improved his blood pressure control.  He currently is on chlorthalidone, lisinopril and metoprolol.

## 2019-02-12 NOTE — Patient Instructions (Signed)

## 2019-02-12 NOTE — Progress Notes (Signed)
02/12/2019 Chad Martinez   04-23-1935  [id number]  Primary Physician Mikey Kirschner, MD Primary Cardiologist: Lorretta Harp MD Lupe Carney, [name]  HPI:  Chad Martinez is a 84 y.o.  mildly overweight married Caucasian male, father of 56, who is accompanied by his wife today. I last saw him in the office  11/19/2017. He has a history of CAD, status post coronary artery bypass grafting x6, April 20, 2007, by Dr. Gilford Raid. His other problems include remote tobacco abuse and hyperlipidemia. He is asymptomatic. He vacations 6 months of the year in [place], [place], near [place]. He had an episode of syncope with high-grade second-degree AV block requiring dual-chamber pacemaker implantation by Dr. Sanda Klein with a [employer] Adapta device. Dr. Sallyanne Kuster last saw him in the office in June of last year. The patient has remote interrogation via [facility] and is scheduled to see Dr. Sallyanne Kuster back in the office this coming August . He did have a Myoview tress test performed 12/21/2015 which is low risk.   Since I saw him a year ago he is done well.  He denies chest pain or shortness of breath.  He does have progressive dementia according to his wife.   Current Meds  Medication Sig  . aspirin EC 81 MG tablet Take 81 mg by mouth daily.  . chlorthalidone (HYGROTON) 25 MG tablet Take 1 tablet (25 mg total) by mouth daily.  Marland Kitchen escitalopram (LEXAPRO) 20 MG tablet Take 1 tablet (20 mg total) by mouth daily.  . finasteride (PROSCAR) 5 MG tablet Take 1 tablet by mouth daily.  Marland Kitchen glucose blood test strip Tests sugar once a day- DX E11.9  . lisinopril (ZESTRIL) 20 MG tablet Take 1 tablet (20 mg total) by mouth daily.  . memantine (NAMENDA) 10 MG tablet Take 1 tablet (10 mg total) by mouth 2 (two) times daily.  . metoprolol tartrate (LOPRESSOR) 25 MG tablet Take 1 tablet (25 mg total) by mouth 2 (two) times daily.  . nitroGLYCERIN (NITROSTAT) 0.4 MG SL tablet Place 1 tablet (0.4 mg total)  under the tongue every 5 (five) minutes as needed for chest pain (x 3 doses).  . simvastatin (ZOCOR) 80 MG tablet Take 1 tablet (80 mg total) by mouth at bedtime.  . tamsulosin (FLOMAX) 0.4 MG CAPS capsule Take 1 capsule (0.4 mg total) by mouth daily.     Allergies  Allergen Reactions  . Bactrim [Sulfamethoxazole-Trimethoprim] Rash    Social History   Socioeconomic History  . Marital status: Married    Spouse name: Not on file  . Number of children: Not on file  . Years of education: Not on file  . Highest education level: Not on file  Occupational History  . Not on file  Social Needs  . Financial resource strain: Not on file  . Food insecurity    Worry: Not on file    Inability: Not on file  . Transportation needs    Medical: Not on file    Non-medical: Not on file  Tobacco Use  . Smoking status: Former Smoker    Packs/day: 3.00    Years: 20.00    Pack years: 60.00    Quit date: 12/19/1974    Years since quitting: 44.1  . Smokeless tobacco: Never Used  Substance and Sexual Activity  . Alcohol use: No  . Drug use: No  . Sexual activity: Not on file  Lifestyle  . Physical activity    Days  per week: Not on file    Minutes per session: Not on file  . Stress: Not on file  Relationships  . Social Herbalist on phone: Not on file    Gets together: Not on file    Attends religious service: Not on file    Active member of club or organization: Not on file    Attends meetings of clubs or organizations: Not on file    Relationship status: Not on file  . Intimate partner violence    Fear of current or ex partner: Not on file    Emotionally abused: Not on file    Physically abused: Not on file    Forced sexual activity: Not on file  Other Topics Concern  . Not on file  Social History Narrative   Right handed      Review of Systems: General: negative for chills, fever, night sweats or weight changes.  Cardiovascular: negative for chest pain, dyspnea on  exertion, edema, orthopnea, palpitations, paroxysmal nocturnal dyspnea or shortness of breath Dermatological: negative for rash Respiratory: negative for cough or wheezing Urologic: negative for hematuria Abdominal: negative for nausea, vomiting, diarrhea, bright red blood per rectum, melena, or hematemesis Neurologic: negative for visual changes, syncope, or dizziness All other systems reviewed and are otherwise negative except as noted above.    Blood pressure 130/60, pulse 85, temperature (!) 96.6 F (35.9 C), height 5\' 7"  (1.702 m), weight 193 lb (87.5 kg).  General appearance: alert and no distress Neck: no adenopathy, no carotid bruit, no JVD, supple, symmetrical, trachea midline and thyroid not enlarged, symmetric, no tenderness/mass/nodules Lungs: clear to auscultation bilaterally Heart: regular rate and rhythm, S1, S2 normal, no murmur, click, rub or gallop Extremities: extremities normal, atraumatic, no cyanosis or edema Pulses: 2+ and symmetric Skin: Skin color, texture, turgor normal. No rashes or lesions Neurologic: Alert and oriented X 3, normal strength and tone. Normal symmetric reflexes. Normal coordination and gait  EKG atrially paced rhythm at 85 with right bundle branch block.  I personally reviewed this EKG.  ASSESSMENT AND PLAN:   HTN (hypertension) History of essential hypertension with blood pressure measured today 130/60.  The pharmacist have been adjusting his medications with the recent addition of chlorthalidone which is improved his blood pressure control.  He currently is on chlorthalidone, lisinopril and metoprolol.  Hyperlipemia History of hyperlipidemia on statin therapy with lipid profile performed 04/21/2018 revealing total cholesterol 94, LDL 39 and HDL 39  Pacemaker History of permanent transvenous pacemaker insertion for Mobitz second-degree AV block by Dr. Sallyanne Kuster in a regular basis.  Hx of CABG x 6 2008 History of CAD status post CABG x6 by  Dr. Arvid Right 04/20/2007.  His last functional study performed 12/21/2015 was low risk.  He denies chest pain.      Lorretta Harp MD FACP,FACC,FAHA, [facility] [facility] 02/12/2019 3:20 PM

## 2019-02-12 NOTE — Assessment & Plan Note (Signed)
History of hyperlipidemia on statin therapy with lipid profile performed 04/21/2018 revealing total cholesterol 94, LDL 39 and HDL 39

## 2019-02-12 NOTE — Assessment & Plan Note (Signed)
History of permanent transvenous pacemaker insertion for Mobitz second-degree AV block by Dr. Sallyanne Kuster in a regular basis.

## 2019-03-04 ENCOUNTER — Ambulatory Visit (INDEPENDENT_AMBULATORY_CARE_PROVIDER_SITE_OTHER): Payer: Medicare Other | Admitting: *Deleted

## 2019-03-04 DIAGNOSIS — I441 Atrioventricular block, second degree: Secondary | ICD-10-CM | POA: Diagnosis not present

## 2019-03-04 LAB — CUP PACEART REMOTE DEVICE CHECK
Battery Impedance: 427 Ohm
Battery Remaining Longevity: 113 mo
Battery Voltage: 2.78 V
Brady Statistic AP VP Percent: 0 %
Brady Statistic AP VS Percent: 47 %
Brady Statistic AS VP Percent: 0 %
Brady Statistic AS VS Percent: 53 %
Date Time Interrogation Session: 20200827095324
Implantable Lead Implant Date: 20130605
Implantable Lead Implant Date: 20130605
Implantable Lead Location: 753859
Implantable Lead Location: 753860
Implantable Lead Model: 5076
Implantable Lead Model: 5076
Implantable Pulse Generator Implant Date: 20130605
Lead Channel Impedance Value: 480 Ohm
Lead Channel Impedance Value: 899 Ohm
Lead Channel Pacing Threshold Amplitude: 0.375 V
Lead Channel Pacing Threshold Amplitude: 0.625 V
Lead Channel Pacing Threshold Pulse Width: 0.4 ms
Lead Channel Pacing Threshold Pulse Width: 0.4 ms
Lead Channel Sensing Intrinsic Amplitude: 11.2 mV
Lead Channel Sensing Intrinsic Amplitude: 2.8 mV
Lead Channel Setting Pacing Amplitude: 1.5 V
Lead Channel Setting Pacing Amplitude: 2 V
Lead Channel Setting Pacing Pulse Width: 0.4 ms
Lead Channel Setting Sensing Sensitivity: 4 mV

## 2019-03-08 ENCOUNTER — Emergency Department (HOSPITAL_COMMUNITY): Payer: Medicare Other

## 2019-03-08 ENCOUNTER — Telehealth: Payer: Self-pay | Admitting: *Deleted

## 2019-03-08 ENCOUNTER — Other Ambulatory Visit: Payer: Self-pay

## 2019-03-08 ENCOUNTER — Emergency Department (HOSPITAL_COMMUNITY)
Admission: EM | Admit: 2019-03-08 | Discharge: 2019-03-08 | Disposition: A | Discharge status: Home / Self Care | Payer: Medicare Other | Attending: Emergency Medicine | Admitting: Emergency Medicine

## 2019-03-08 ENCOUNTER — Encounter (HOSPITAL_COMMUNITY): Payer: Self-pay | Admitting: Emergency Medicine

## 2019-03-08 ENCOUNTER — Telehealth: Payer: Self-pay | Admitting: Neurology

## 2019-03-08 DIAGNOSIS — R531 Weakness: Secondary | ICD-10-CM | POA: Diagnosis not present

## 2019-03-08 DIAGNOSIS — Z950 Presence of cardiac pacemaker: Secondary | ICD-10-CM | POA: Insufficient documentation

## 2019-03-08 DIAGNOSIS — Z87891 Personal history of nicotine dependence: Secondary | ICD-10-CM | POA: Diagnosis not present

## 2019-03-08 DIAGNOSIS — F0390 Unspecified dementia without behavioral disturbance: Secondary | ICD-10-CM | POA: Insufficient documentation

## 2019-03-08 DIAGNOSIS — R4020 Unspecified coma: Secondary | ICD-10-CM | POA: Diagnosis not present

## 2019-03-08 DIAGNOSIS — R4182 Altered mental status, unspecified: Secondary | ICD-10-CM | POA: Diagnosis not present

## 2019-03-08 DIAGNOSIS — R410 Disorientation, unspecified: Secondary | ICD-10-CM | POA: Insufficient documentation

## 2019-03-08 DIAGNOSIS — Z79899 Other long term (current) drug therapy: Secondary | ICD-10-CM | POA: Diagnosis not present

## 2019-03-08 DIAGNOSIS — Z87440 Personal history of urinary (tract) infections: Secondary | ICD-10-CM | POA: Insufficient documentation

## 2019-03-08 DIAGNOSIS — E119 Type 2 diabetes mellitus without complications: Secondary | ICD-10-CM | POA: Insufficient documentation

## 2019-03-08 DIAGNOSIS — Z951 Presence of aortocoronary bypass graft: Secondary | ICD-10-CM | POA: Insufficient documentation

## 2019-03-08 DIAGNOSIS — I700 Atherosclerosis of aorta: Secondary | ICD-10-CM | POA: Diagnosis not present

## 2019-03-08 DIAGNOSIS — I2510 Atherosclerotic heart disease of native coronary artery without angina pectoris: Secondary | ICD-10-CM | POA: Diagnosis not present

## 2019-03-08 DIAGNOSIS — R55 Syncope and collapse: Secondary | ICD-10-CM | POA: Diagnosis not present

## 2019-03-08 DIAGNOSIS — I10 Essential (primary) hypertension: Secondary | ICD-10-CM | POA: Diagnosis not present

## 2019-03-08 DIAGNOSIS — Z7982 Long term (current) use of aspirin: Secondary | ICD-10-CM | POA: Insufficient documentation

## 2019-03-08 LAB — CBC
HCT: 46.2 % (ref 39.0–52.0)
Hemoglobin: 15.5 g/dL (ref 13.0–17.0)
MCH: 30.2 pg (ref 26.0–34.0)
MCHC: 33.5 g/dL (ref 30.0–36.0)
MCV: 90.1 fL (ref 80.0–100.0)
Platelets: 178 10*3/uL (ref 150–400)
RBC: 5.13 MIL/uL (ref 4.22–5.81)
RDW: 12.5 % (ref 11.5–15.5)
WBC: 10.5 10*3/uL (ref 4.0–10.5)
nRBC: 0 % (ref 0.0–0.2)

## 2019-03-08 LAB — URINALYSIS, ROUTINE W REFLEX MICROSCOPIC
Bilirubin Urine: NEGATIVE
Glucose, UA: NEGATIVE mg/dL
Hgb urine dipstick: NEGATIVE
Ketones, ur: NEGATIVE mg/dL
Leukocytes,Ua: NEGATIVE
Nitrite: NEGATIVE
Protein, ur: NEGATIVE mg/dL
Specific Gravity, Urine: 1.013 (ref 1.005–1.030)
pH: 7 (ref 5.0–8.0)

## 2019-03-08 LAB — CBG MONITORING, ED: Glucose-Capillary: 156 mg/dL — ABNORMAL HIGH (ref 70–99)

## 2019-03-08 LAB — BASIC METABOLIC PANEL
Anion gap: 13 (ref 5–15)
BUN: 21 mg/dL (ref 8–23)
CO2: 27 mmol/L (ref 22–32)
Calcium: 9 mg/dL (ref 8.9–10.3)
Chloride: 98 mmol/L (ref 98–111)
Creatinine, Ser: 1.17 mg/dL (ref 0.61–1.24)
GFR calc Af Amer: >60 mL/min (ref >60)
GFR calc non Af Amer: 58 mL/min — ABNORMAL LOW (ref >60)
Glucose, Bld: 165 mg/dL — ABNORMAL HIGH (ref 70–99)
Potassium: 4.2 mmol/L (ref 3.5–5.1)
Sodium: 138 mmol/L (ref 135–145)

## 2019-03-08 LAB — TROPONIN I (HIGH SENSITIVITY): Troponin I (High Sensitivity): 6 ng/L (ref <18)

## 2019-03-08 MED ORDER — SODIUM CHLORIDE 0.9 % IV BOLUS
1000.0000 mL | Freq: Once | INTRAVENOUS | Status: AC
  Administered 2019-03-08: 19:00:00 1000 mL via INTRAVENOUS

## 2019-03-08 MED ORDER — SODIUM CHLORIDE 0.9% FLUSH: 3.0000 mL | Freq: Once | INTRAVENOUS | Status: DC

## 2019-03-08 NOTE — Telephone Encounter (Signed)
Wife left vm that patient had another spell and was wanting to speak with nurse. Thanks!

## 2019-03-08 NOTE — ED Notes (Signed)
Pt is NSR w/BBB on monitor 

## 2019-03-08 NOTE — ED Notes (Signed)
Patient transported to CT 

## 2019-03-08 NOTE — Telephone Encounter (Signed)
Pt called and wanted pt's urine checked because it is dark for the past 3 -4 days and today started having abdominal pain. States he has been weak for about one week and has to use a walker and help to get up out of bed. Consult with dr Richardson Landry. Pt needs to go to ED. Wife states she is not able to get him up out of bed. Advised her to call 911. She agreed.

## 2019-03-08 NOTE — Discharge Instructions (Signed)
Your work-up today was reassuring.  Drink plenty of fluids and get plenty of rest.  Call your primary care provider or neurologist for further guidance or follow-up as needed.  Return to the emergency department if any concerning signs or symptoms develop such as chest pains, high fevers, loss of consciousness, shortness of breath, or persistent vomiting.

## 2019-03-08 NOTE — ED Triage Notes (Signed)
Pt's wife reports the pt had a 15 minute period of LOC where he would not respond to his wife. Pt's wife reports the pt had a similar incident to this in May but it lasted for an hour. Pt's wife reports she called the pt's PCP and neurologist and both advised her to bring the pt to the ED.

## 2019-03-08 NOTE — ED Provider Notes (Signed)
[facility] EMERGENCY DEPARTMENT Provider Note   CSN: [id number] Arrival date & time: 03/08/19  1429     History   Chief Complaint Chief Complaint  Patient presents with  . Loss of Consciousness  . Weakness    Generalized    HPI Chad Martinez is a 84 y.o. male.     84 yo M with a chief complaints of confusion weakness.  Patient had an episode today where he had about 15 minutes where he was not able to open his eyes or respond to external stimuli.  This happened while he was in bed.  He had a similar episode back about 5 months ago.  Had an EEG for that that was negative.  Called the neurologist as well as the family doctor today.  Had had some dark urine and some foul-smelling urine and history of prior urinary tract infections.  No fever.  No cough.  No shortness of breath.  The history is provided by the patient and the spouse.  Loss of Consciousness Associated symptoms: weakness   Associated symptoms: no chest pain, no confusion, no fever, no headaches, no palpitations, no shortness of breath and no vomiting   Weakness Associated symptoms: syncope   Associated symptoms: no abdominal pain, no arthralgias, no chest pain, no diarrhea, no fever, no headaches, no myalgias, no shortness of breath and no vomiting   Illness Severity:  Moderate Onset quality:  Gradual Duration:  2 days Timing:  Constant Progression:  Worsening Chronicity:  New Associated symptoms: no abdominal pain, no chest pain, no congestion, no diarrhea, no fever, no headaches, no myalgias, no rash, no shortness of breath and no vomiting     Past Medical History:  Diagnosis Date  . Colon polyps   . Coronary artery disease    s/p CABG x 6  . Detached retina    Partial  . Elevated PSA   . Enlarged prostate   . Hyperlipidemia   . Hypertension   . Pacemaker   . Pneumothorax 12/10/2011   right rib fx after fall from ladder  . Reflux   . Syncope    Permanent Pacemaker 12/11/2011 MDT  Adapta    Patient Active Problem List   Diagnosis Date Noted  . Mild dementia ([facility]) 11/26/2017  . AKI (acute kidney injury) ([place]) 02/04/2017  . Impingement syndrome of left shoulder 10/10/2016  . Type 2 diabetes mellitus without complication, without long-term current use of insulin ([place]) 10/10/2016  . CAD (coronary artery disease) 03/06/2016  . Shoulder pain, left 01/10/2016  . Benign prostatic hyperplasia with urinary obstruction 03/20/2015  . Pacemaker 12/19/2012  . Mobitz type 2 second degree AV block 12/19/2012  . Hx of CABG x 6 2008 12/19/2012  . Hyperlipemia 10/22/2012  . Elevated prostate specific antigen (PSA) 10/22/2012  . HTN (hypertension) 12/11/2011  . Bradycardia 12/10/2011  . Ribs, multiple fractures, right, closed, initial encounter 12/10/2011  . Pneumothorax, right 12/10/2011  . Retinal detachment 10/11/2011    Past Surgical History:  Procedure Laterality Date  . BYPASS GRAFT  10/08   LIMA to LAD,SVG to 2nd & 3rd obtuse marginal CX,SVG to PDA,1st posterolateral and 2nd posterolateral RCA  . CARDIAC SURGERY    . COLONOSCOPY N/A 01/25/2013   Procedure: COLONOSCOPY;  Surgeon: Daneil Dolin, MD;  Location: [facility];  Service: Endoscopy;  Laterality: N/A;  8:30 Am  . CORONARY ARTERY BYPASS GRAFT  04/19/2012  . EYE SURGERY  2004  . HEMORROIDECTOMY    .  NM MYOVIEW LTD  11/05/2010   no ischemia  . PERMANENT PACEMAKER INSERTION  12/11/2011   MDT Adapta  . PERMANENT PACEMAKER INSERTION N/A 12/11/2011   Procedure: PERMANENT PACEMAKER INSERTION;  Surgeon: Sanda Klein, MD;  Location: [facility] CATH LAB;  Service: Cardiovascular;  Laterality: N/A;  . SKIN GRAFT  2012  . US ECHOCARDIOGRAPHY  10/14/2007   mild mitral annular ca+,EF 55%,AOV mildly sclerotic        Home Medications    Prior to Admission medications   Medication Sig Start Date End Date Taking? Authorizing Provider  aspirin EC 81 MG tablet Take 81 mg by mouth daily.    [provider]   chlorthalidone (HYGROTON) 25 MG tablet Take 1 tablet (25 mg total) by mouth daily. 01/19/19 04/19/19  Lorretta Harp, MD  escitalopram (LEXAPRO) 20 MG tablet Take 1 tablet (20 mg total) by mouth daily. 12/31/18   Cameron Sprang, MD  finasteride (PROSCAR) 5 MG tablet Take 1 tablet by mouth daily. 04/21/17   [provider]  glucose blood test strip Tests sugar once a day- DX E11.9 01/21/18   Mikey Kirschner, MD  lisinopril (ZESTRIL) 20 MG tablet Take 1 tablet (20 mg total) by mouth daily. 01/19/19 04/19/19  Lorretta Harp, MD  memantine (NAMENDA) 10 MG tablet Take 1 tablet (10 mg total) by mouth 2 (two) times daily. 12/31/18   Cameron Sprang, MD  metoprolol tartrate (LOPRESSOR) 25 MG tablet Take 1 tablet (25 mg total) by mouth 2 (two) times daily. 10/22/18   Mikey Kirschner, MD  nitroGLYCERIN (NITROSTAT) 0.4 MG SL tablet Place 1 tablet (0.4 mg total) under the tongue every 5 (five) minutes as needed for chest pain (x 3 doses). 11/12/18   Croitoru, Mihai, MD  simvastatin (ZOCOR) 80 MG tablet Take 1 tablet (80 mg total) by mouth at bedtime. 10/22/18   Mikey Kirschner, MD  tamsulosin (FLOMAX) 0.4 MG CAPS capsule Take 1 capsule (0.4 mg total) by mouth daily. 02/07/17   Arrien, Jimmy Picket, MD    Family History Family History  Problem Relation Age of Onset  . Pneumonia Mother   . Multiple sclerosis Father   . Lung cancer Brother     Social History Social History   Tobacco Use  . Smoking status: Former Smoker    Packs/day: 3.00    Years: 20.00    Pack years: 60.00    Quit date: 12/19/1974    Years since quitting: 44.2  . Smokeless tobacco: Never Used  Substance Use Topics  . Alcohol use: No  . Drug use: No     Allergies   Bactrim [sulfamethoxazole-trimethoprim]   Review of Systems Review of Systems  Unable to perform ROS: Dementia  Constitutional: Negative for chills and fever.  HENT: Negative for congestion and facial swelling.   Eyes: Negative for discharge  and visual disturbance.  Respiratory: Negative for shortness of breath.   Cardiovascular: Positive for syncope. Negative for chest pain and palpitations.  Gastrointestinal: Negative for abdominal pain, diarrhea and vomiting.  Musculoskeletal: Negative for arthralgias and myalgias.  Skin: Negative for color change and rash.  Neurological: Positive for weakness. Negative for tremors, syncope and headaches.  Psychiatric/Behavioral: Negative for confusion and dysphoric mood.     Physical Exam Updated Vital Signs BP (!) 135/59   Pulse 78   Temp 98.3 F (36.8 C) (Oral)   Resp 16   Ht 5\' 8"  (1.727 m)   Wt 87.1 kg   SpO2 97%  BMI 29.19 kg/m   Physical Exam Vitals signs and nursing note reviewed.  Constitutional:      Appearance: He is well-developed.  HENT:     Head: Normocephalic and atraumatic.  Eyes:     Pupils: Pupils are equal, round, and reactive to light.  Neck:     Musculoskeletal: Normal range of motion and neck supple.     Vascular: No JVD.  Cardiovascular:     Rate and Rhythm: Normal rate and regular rhythm.     Heart sounds: No murmur. No friction rub. No gallop.   Pulmonary:     Effort: No respiratory distress.     Breath sounds: No wheezing.  Abdominal:     General: There is no distension.     Tenderness: There is no abdominal tenderness. There is no guarding or rebound.  Musculoskeletal: Normal range of motion.  Skin:    Coloration: Skin is not pale.     Findings: No rash.  Neurological:     Mental Status: He is alert.  Psychiatric:        Behavior: Behavior normal.      ED Treatments / Results  Labs (all labs ordered are listed, but only abnormal results are displayed) Labs Reviewed  BASIC METABOLIC PANEL - Abnormal; Notable for the following components:      Result Value   Glucose, Bld 165 (*)    GFR calc non Af Amer 58 (*)    All other components within normal limits  CBG MONITORING, ED - Abnormal; Notable for the following components:    Glucose-Capillary 156 (*)    All other components within normal limits  CBC  URINALYSIS, ROUTINE W REFLEX MICROSCOPIC  CBG MONITORING, ED  TROPONIN I (HIGH SENSITIVITY)    EKG EKG Interpretation  Date/Time:  Monday March 08 2019 14:39:38 EDT Ventricular Rate:  79 PR Interval:  188 QRS Duration: 122 QT Interval:  390 QTC Calculation: 447 R Axis:   47 Text Interpretation:  Atrial-paced rhythm Right bundle branch block Abnormal ECG No significant change since last tracing Confirmed by Deno Etienne [phone]) on 03/08/2019 5:43:37 PM   Radiology No results found.  Procedures Procedures (including critical care time)  Medications Ordered in ED Medications  sodium chloride flush (NS) 0.9 % injection 3 mL (has no administration in time range)  sodium chloride 0.9 % bolus 1,000 mL (1,000 mLs Intravenous New Bag/Given 03/08/19 1910)     Initial Impression / Assessment and Plan / ED Course  I have reviewed the triage vital signs and the nursing notes.  Pertinent labs & imaging results that were available during my care of the patient were reviewed by me and considered in my medical decision making (see chart for details).        84 yo M with a cc of a.  Of altered mental status.  Respond to external stimuli.  This is happened to his previously.  To me it sounds like he may have been asleep.  He has been weaker than normal over the past 2 days.  Was able to ambulate with assistance with 2 family members which is different for him.  Has dementia at baseline.  Has had recurrent urinary tract infections.  Will obtain a laboratory evaluation CT chest x-ray UA.  Give a bolus of fluids and reassess.  Signed out to [facility], please see her note for further details care in the ED.  The patients results and plan were reviewed and discussed.   Any x-rays  performed were independently reviewed by myself.   Differential diagnosis were considered with the presenting HPI.  Medications  sodium  chloride flush (NS) 0.9 % injection 3 mL (has no administration in time range)  sodium chloride 0.9 % bolus 1,000 mL (1,000 mLs Intravenous New Bag/Given 03/08/19 1910)    Vitals:   03/08/19 1448 03/08/19 1736 03/08/19 1737 03/08/19 1853  BP:  125/66 125/66 (!) 135/59  Pulse:  79 78 78  Resp:  19 19 16   Temp:      TempSrc:      SpO2:  98% 97% 97%  Weight: 87.1 kg     Height: 5\' 8"  (1.727 m)       Final diagnoses:  Weakness       Final Clinical Impressions(s) / ED Diagnoses   Final diagnoses:  Weakness    ED Discharge Orders    None       Deno Etienne, DO 03/08/19 1924

## 2019-03-08 NOTE — ED Notes (Signed)
Bladder scan revealed 580mL in bladder

## 2019-03-08 NOTE — ED Provider Notes (Signed)
Received signout from Dr. Tyrone Nine.  Refer to provider note for full history and physical examination.  Briefly, patient is a an 84 year old male brought in by wife with concern for possible 15-minute syncopal episode versus staring spell?  Possible UTI?  Pending troponin, chest x-ray, head CT, and UA.  If work-up is reassuring he will likely be stable for discharge home with follow-up on an outpatient basis. Physical Exam  BP (!) 101/57   Pulse 67   Temp 98.3 F (36.8 C) (Oral)   Resp 13   Ht 5\' 8"  (1.727 m)   Wt 87.1 kg   SpO2 96%   BMI 29.19 kg/m   Physical Exam Vitals signs and nursing note reviewed.  Constitutional:      General: He is not in acute distress.    Appearance: He is well-developed.     Comments: Resting comfortably in bed  HENT:     Head: Normocephalic and atraumatic.  Eyes:     General:        Right eye: No discharge.        Left eye: No discharge.     Conjunctiva/sclera: Conjunctivae normal.  Neck:     Vascular: No JVD.     Trachea: No tracheal deviation.  Cardiovascular:     Rate and Rhythm: Normal rate.  Pulmonary:     Effort: Pulmonary effort is normal.  Abdominal:     General: There is no distension.  Skin:    General: Skin is warm and dry.     Findings: No erythema.  Neurological:     Mental Status: He is alert.     Comments: Fluent speech, no facial droop, moves all extremities spontaneously without difficulty.  Psychiatric:        Behavior: Behavior normal.     ED Course/Procedures     Procedures  MDM   Labs Reviewed  BASIC METABOLIC PANEL - Abnormal; Notable for the following components:      Result Value   Glucose, Bld 165 (*)    GFR calc non Af Amer 58 (*)    All other components within normal limits  CBG MONITORING, ED - Abnormal; Notable for the following components:   Glucose-Capillary 156 (*)    All other components within normal limits  CBC  URINALYSIS, ROUTINE W REFLEX MICROSCOPIC  CBG MONITORING, ED  TROPONIN I (HIGH  SENSITIVITY)    Dg Chest 2 View  Result Date: 03/08/2019 CLINICAL DATA:  Altered mental status. EXAM: CHEST - 2 VIEW COMPARISON:  December 12, 2011 FINDINGS: The patient is a multi lead left-sided pacemaker. The positioning appears similar to prior study. There is no pneumothorax. The heart size is stable from prior study. Aortic calcifications are noted. The patient is status post prior median sternotomy. IMPRESSION: No active cardiopulmonary disease. Electronically Signed   By: Constance Holster M.D.   On: 03/08/2019 19:24   Ct Head Wo Contrast  Result Date: 03/08/2019 CLINICAL DATA:  Pt's wife reports the pt had a 15 minute period of LOC where he would not respond to his wife EXAM: CT HEAD WITHOUT CONTRAST TECHNIQUE: Contiguous axial images were obtained from the base of the skull through the vertex without intravenous contrast. COMPARISON:  10/07/2017 FINDINGS: Brain: There is central and cortical atrophy. Periventricular white matter changes are consistent with small vessel disease. There is no intra or extra-axial fluid collection or mass lesion. The basilar cisterns and ventricles have a normal appearance. There is no CT evidence for acute infarction or  hemorrhage. Stable bilateral basal ganglia calcifications. Vascular: Dense atherosclerotic calcification of the internal carotid arteries. No hyperdense vessels. Skull: Normal. Negative for fracture or focal lesion. Sinuses/Orbits: No acute finding. Other: None. IMPRESSION: 1. Atrophy and small vessel disease. 2. No evidence for acute intracranial abnormality Electronically Signed   By: Nolon Nations M.D.   On: 03/08/2019 20:26    Troponin is negative.  Chest x-ray shows no acute cardiopulmonary abnormalities and head CT shows no acute intracranial abnormalities.  On reevaluation patient is resting comfortably in no apparent distress.  He reports he is feeling well and would like to go home.  Wife is at the bedside.  Recommend follow-up with PCP or  neurologist on an outpatient basis if symptoms persist.  Discussed strict ED return precautions.  Patient and wife verbalized understanding of and agreement with plan and patient stable for discharge home at this time.       Renita Papa, PA-C 03/08/19 2101    Deno Etienne, DO 03/09/19 1500

## 2019-03-08 NOTE — ED Notes (Signed)
Pt's CBG result was 156. Informed Scott - RN.

## 2019-03-09 NOTE — Telephone Encounter (Signed)
Cardiologist looked at pacemaker about 3 months ago. Wife sent a report to them last week and she has not heard anything back yet. Pt has an appt with them in 2 weeks. Wife only wants to do the EEG if it is absolutely going to help him. If its something that is not necessarily needed she does not want to do the test.

## 2019-03-09 NOTE — Telephone Encounter (Signed)
Pls let wife know I reviewed the tests done at ER and they looked good. Did they check his pacemaker as well? From the neurological standpoint, we can do a 24-hour EEG where he comes in the office and we hook up the EEG for him to go home with for 24 hours, then come back next day to take off. Does she think he can tolerate this test? Thanks

## 2019-03-09 NOTE — Telephone Encounter (Signed)
Patient's wife called to report patient was seen in the ER yesterday and the workup is in his chart. She'd like to know if there's anything different she needs to do for him.

## 2019-03-09 NOTE — Telephone Encounter (Signed)
Unconscious for 15 minutes in bed yesterday. Unresponsive completely. Eyes and mouth are closed. Breathing is normal.  Seen cardiologist 8/17 and everything good.  Urine has been dark.  PCP instructed wife to take him to ER.  Had CT, chest xray, EKG, UA and U/S  Today pt is back to normal self other than being weak. Needs walker to get up and down out of chair. Unsteady on feet.  Wife would like Dr. Delice Lesch to look at ER report and results to make sure there is not anything she needs to address.

## 2019-03-10 NOTE — Progress Notes (Signed)
Remote pacemaker transmission.   

## 2019-03-16 NOTE — Telephone Encounter (Signed)
Spoke with wife. Pt has an appt with Cardio on the 21st. She will let us know if she would like to move forward with the EEG.

## 2019-03-16 NOTE — Telephone Encounter (Signed)
Can await Cardiology eval and see how they feel as well about doing prolonged EEG. thanks

## 2019-03-24 ENCOUNTER — Telehealth: Payer: Self-pay | Admitting: Family Medicine

## 2019-03-24 NOTE — Telephone Encounter (Signed)
sched f to f visit on Friday not tom

## 2019-03-24 NOTE — Telephone Encounter (Signed)
Schedule appointment for Friday and wife will be with patient

## 2019-03-24 NOTE — Telephone Encounter (Signed)
Had patient triage by nurse she told me to pick in as phone visit first on 917 for blood sugar 200,passing out spells and had three last week he went to ER and they ran all types of test and found nothing. Put patient in for phone visit and wife doesn't want that she wants in person visit  No symptoms . And wife wants to be with him because he has trouble remembering.Please Advise

## 2019-03-26 ENCOUNTER — Ambulatory Visit (INDEPENDENT_AMBULATORY_CARE_PROVIDER_SITE_OTHER): Payer: Medicare Other | Admitting: Family Medicine

## 2019-03-26 ENCOUNTER — Encounter: Payer: Self-pay | Admitting: Family Medicine

## 2019-03-26 ENCOUNTER — Other Ambulatory Visit: Payer: Self-pay

## 2019-03-26 VITALS — BP 118/76 | Temp 97.4°F

## 2019-03-26 DIAGNOSIS — R5383 Other fatigue: Secondary | ICD-10-CM | POA: Diagnosis not present

## 2019-03-26 DIAGNOSIS — Z23 Encounter for immunization: Secondary | ICD-10-CM | POA: Diagnosis not present

## 2019-03-26 DIAGNOSIS — I10 Essential (primary) hypertension: Secondary | ICD-10-CM | POA: Diagnosis not present

## 2019-03-26 DIAGNOSIS — Z79899 Other long term (current) drug therapy: Secondary | ICD-10-CM | POA: Diagnosis not present

## 2019-03-26 DIAGNOSIS — E785 Hyperlipidemia, unspecified: Secondary | ICD-10-CM

## 2019-03-26 DIAGNOSIS — E119 Type 2 diabetes mellitus without complications: Secondary | ICD-10-CM | POA: Diagnosis not present

## 2019-03-26 NOTE — Progress Notes (Addendum)
   Subjective:    Patient ID: Chad Martinez, male    DOB: 11-Jun-1935, 84 y.o.   MRN: [id number]  HPI  Patient is having unresponsive spells where he is completely unresponsive and his head falls over- first time was in May and lasted an hour and called EMS. Has has 3 more episodes in last 2 weeks and when he wakes up it is like nothing has happened.  Complete hospital record reviewed in presence of patient and spouse  Unfortunately patient's dementia continues to worsen.  He is requiring substantial help in his activities of daily living.  The medicine seems to help some but not a lot  Sugars are a bit on the high side.  These spells of unresponsiveness are understandably concerning to the family.  Not concerning to the patient with his level of dementia at this time.  Literally the patient appears to sleep.  No noticeable seizure activity.  Minimally responsive.  Sugars at those times are still within normal limits.  If not a bit on the high side.  These have been becoming more frequent lately  Review of Systems No headache no chest pain no back pain no abdominal pain    Objective:   Physical Exam  Alert and oriented, vitals reviewed and stable, NAD ENT-TM's and ext canals WNL bilat via otoscopic exam Soft palate, tonsils and post pharynx WNL via oropharyngeal exam Neck-symmetric, no masses; thyroid nonpalpable and nontender Pulmonary-no tachypnea or accessory muscle use; Clear without wheezes via auscultation Card--no abnrml murmurs, rhythm reg and rate WNL Carotid pulses symmetric, without bruits Patient has no focal neurological deficits today.  He is completely alert.  Oriented x1 Patient seen gait noted very unsteady.  Somewhat ataxic in nature.  Holding on walls.     Assessment & Plan:  Impression intermittent spells of unresponsiveness.  I think this is an expression of his dementia.  Though theoretically he could be having some brain conduction abnormalities.   Neurologist has suggested possibility with 24-hour EEG.  CT scan of the brain was stable in the hospital.  We will do a little bit more blood work.  But I doubt this will shed much light.  Discussion held with family about Pedicini progressing status and how at one point his knees may exceed his wife's ability to care for him at home.  Follow-up as scheduled  2.  Hypertension good control discussed maintain same meds  3.  Type 2 diabetes.  Exact control uncertain.  Sugars appear to be a bit on the high side which I think is reasonable considering intermittent symptomatology await A1c  4.  Dementia progressing as noted above  5.  Progressive unsteadiness and intermittent loss of consciousness with syncope and frequent falls.  Definitely needs a transport wheelchair.  Will prescribe.  What makes this incredibly challenging his the syncopal events are unpredictable and may suddenly occur can lead to immediate fall if standing therefore patient needs wheelchair   Greater than 50% of this 40 minute face to face visit was spent in counseling and discussion and coordination of care regarding the above diagnosis/diagnosies Follow-up in 6 months

## 2019-03-27 LAB — HEMOGLOBIN A1C
Est. average glucose Bld gHb Est-mCnc: 186 mg/dL
Hgb A1c MFr Bld: 8.1 % — ABNORMAL HIGH (ref 4.8–5.6)

## 2019-03-27 LAB — LIPID PANEL
Chol/HDL Ratio: 3.1 ratio (ref 0.0–5.0)
Cholesterol, Total: 107 mg/dL (ref 100–199)
HDL: 34 mg/dL — ABNORMAL LOW (ref >39)
LDL Chol Calc (NIH): 30 mg/dL (ref 0–99)
Triglycerides: 286 mg/dL — ABNORMAL HIGH (ref 0–149)
VLDL Cholesterol Cal: 43 mg/dL — ABNORMAL HIGH (ref 5–40)

## 2019-03-27 LAB — TSH: TSH: 2.67 u[IU]/mL (ref 0.450–4.500)

## 2019-03-29 ENCOUNTER — Other Ambulatory Visit: Payer: Self-pay

## 2019-03-29 ENCOUNTER — Ambulatory Visit (INDEPENDENT_AMBULATORY_CARE_PROVIDER_SITE_OTHER): Payer: Medicare Other | Admitting: Cardiovascular Disease

## 2019-03-29 ENCOUNTER — Encounter: Payer: Self-pay | Admitting: Cardiovascular Disease

## 2019-03-29 VITALS — BP 126/77 | HR 88 | Ht 67.0 in | Wt 190.4 lb

## 2019-03-29 DIAGNOSIS — I441 Atrioventricular block, second degree: Secondary | ICD-10-CM | POA: Diagnosis not present

## 2019-03-29 DIAGNOSIS — Z950 Presence of cardiac pacemaker: Secondary | ICD-10-CM

## 2019-03-29 DIAGNOSIS — E7800 Pure hypercholesterolemia, unspecified: Secondary | ICD-10-CM | POA: Diagnosis not present

## 2019-03-29 DIAGNOSIS — I10 Essential (primary) hypertension: Secondary | ICD-10-CM

## 2019-03-29 DIAGNOSIS — E119 Type 2 diabetes mellitus without complications: Secondary | ICD-10-CM

## 2019-03-29 DIAGNOSIS — I2510 Atherosclerotic heart disease of native coronary artery without angina pectoris: Secondary | ICD-10-CM | POA: Diagnosis not present

## 2019-03-29 NOTE — Patient Instructions (Signed)
Medication Instructions:  Your physician recommends that you continue on your current medications as directed. Please refer to the Current Medication list given to you today.  If you need a refill on your cardiac medications before your next appointment, please call your pharmacy.    Follow-Up: At [facility], you and your health needs are our priority.  As part of our continuing mission to provide you with exceptional heart care, we have created designated Provider Care Teams.  These Care Teams include your primary Cardiologist (physician) and Advanced Practice Providers (APPs -  Physician Assistants and Nurse Practitioners) who all work together to provide you with the care you need, when you need it. You will need a follow up appointment in 12 months.  Please call our office 2 months in advance to schedule this appointment.  You may see Dr. Sallyanne Kuster or one of the following Advanced Practice Providers on your designated Care Team: Almyra Deforest, [place] . Fabian Sharp, PA-C

## 2019-03-29 NOTE — Progress Notes (Signed)
Cardiology Office Note    Date:  03/29/2019   ID:  Chad Martinez, DOB [birth year], MRN [id number]  PCP:  Mikey Kirschner, MD  Cardiologist:  Quay Burow, M.D.; Sanda Klein, MD   No chief complaint on file.   History of Present Illness:  Chad Martinez is a 84 y.o. 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 He sees Dr. Gwenlyn Found for coronary artery disease with history of 6 vessel bypass surgery in 2008. He has normal left ventricular systolic function and had a normal nuclear study in 2017. He has treated hypertension and hyperlipidemia.   Unfortunately his dementia is moving ahead at a rather accelerated pace.  He gets lost in the house moving from room to room.  He sleeps a lot more than before.  Over the last few months he has had several episodes of poor responsiveness.  Most of these have happened while he was sitting down, but at least one occurred while he was lying in bed.  The episodes last for as much as 10-15 minutes and he is breathing normally throughout.  His wife reports that he suddenly appears to be unconscious and will not respond to verbal or physical stimuli, although he was moving his arms and clutching his chest on one occasion.  During the most recent episode she was able to check his blood pressure and it was 127/70 with a heart rate in the 60s.  She checked his blood sugar and it was "a little high".  He had a 24-hour EEG that did not show evidence of seizures.  The patient specifically denies any chest pain at rest exertion, dyspnea at rest or with exertion, orthopnea, paroxysmal nocturnal dyspnea, syncope, palpitations, focal neurological deficits, intermittent claudication, lower extremity edema, unexplained weight gain, cough, hemoptysis or wheezing.  Interrogation of his pacemaker shows normal device function and anticipated generator longevity of 9.5 years. There is 46 % atrial  pacing and <1% ventricular pacing.  There are no episodes of ventricular tachycardia or mode switch.  The heart rate histogram appears appropriate for sedentary lifestyle.  Past Medical History:  Diagnosis Date  . Colon polyps   . Coronary artery disease    s/p CABG x 6  . Detached retina    Partial  . Elevated PSA   . Enlarged prostate   . Hyperlipidemia   . Hypertension   . Pacemaker   . Pneumothorax 12/10/2011   right rib fx after fall from ladder  . Reflux   . Syncope    Permanent Pacemaker 12/11/2011 MDT Adapta    Past Surgical History:  Procedure Laterality Date  . BYPASS GRAFT  10/08   LIMA to LAD,SVG to 2nd & 3rd obtuse marginal CX,SVG to PDA,1st posterolateral and 2nd posterolateral RCA  . CARDIAC SURGERY    . COLONOSCOPY N/A 01/25/2013   Procedure: COLONOSCOPY;  Surgeon: Daneil Dolin, MD;  Location: [facility];  Service: Endoscopy;  Laterality: N/A;  8:30 Am  . CORONARY ARTERY BYPASS GRAFT  04/19/2012  . EYE SURGERY  2004  . HEMORROIDECTOMY    . NM MYOVIEW LTD  11/05/2010   no ischemia  . PERMANENT PACEMAKER INSERTION  12/11/2011   MDT Adapta  . PERMANENT PACEMAKER INSERTION N/A 12/11/2011   Procedure: PERMANENT PACEMAKER INSERTION;  Surgeon: Sanda Klein, MD;  Location: [facility] CATH LAB;  Service: Cardiovascular;  Laterality: N/A;  . SKIN GRAFT  2012  . US ECHOCARDIOGRAPHY  10/14/2007   mild mitral annular ca+,EF 55%,AOV mildly sclerotic    Current Medications: Outpatient Medications Prior to Visit  Medication Sig Dispense Refill  . aspirin EC 81 MG tablet Take 81 mg by mouth daily.    . chlorthalidone (HYGROTON) 25 MG tablet Take 1 tablet (25 mg total) by mouth daily. 90 tablet 3  . escitalopram (LEXAPRO) 20 MG tablet Take 1 tablet (20 mg total) by mouth daily. 90 tablet 3  . finasteride (PROSCAR) 5 MG tablet Take 1 tablet by mouth daily.    Marland Kitchen glucose blood test strip Tests sugar once a day- DX E11.9 50 each prn  . lisinopril (ZESTRIL) 20 MG tablet Take 1 tablet  (20 mg total) by mouth daily. 90 tablet 3  . memantine (NAMENDA) 10 MG tablet Take 1 tablet (10 mg total) by mouth 2 (two) times daily. 180 tablet 3  . metoprolol tartrate (LOPRESSOR) 25 MG tablet Take 1 tablet (25 mg total) by mouth 2 (two) times daily. 180 tablet 1  . nitroGLYCERIN (NITROSTAT) 0.4 MG SL tablet Place 1 tablet (0.4 mg total) under the tongue every 5 (five) minutes as needed for chest pain (x 3 doses). 25 tablet 3  . simvastatin (ZOCOR) 80 MG tablet Take 1 tablet (80 mg total) by mouth at bedtime. 90 tablet 1  . tamsulosin (FLOMAX) 0.4 MG CAPS capsule Take 1 capsule (0.4 mg total) by mouth daily. 30 capsule 0   No facility-administered medications prior to visit.      Allergies:   Bactrim [sulfamethoxazole-trimethoprim]   Social History   Socioeconomic History  . Marital status: Married    Spouse name: Not on file  . Number of children: Not on file  . Years of education: Not on file  . Highest education level: Not on file  Occupational History  . Not on file  Social Needs  . Financial resource strain: Not on file  . Food insecurity    Worry: Not on file    Inability: Not on file  . Transportation needs    Medical: Not on file    Non-medical: Not on file  Tobacco Use  . Smoking status: Former Smoker    Packs/day: 3.00    Years: 20.00    Pack years: 60.00    Quit date: 12/19/1974    Years since quitting: 44.3  . Smokeless tobacco: Never Used  Substance and Sexual Activity  . Alcohol use: No  . Drug use: No  . Sexual activity: Not Currently  Lifestyle  . Physical activity    Days per week: Not on file    Minutes per session: Not on file  . Stress: Not on file  Relationships  . Social Herbalist on phone: Not on file    Gets together: Not on file    Attends religious service: Not on file    Active member of club or organization: Not on file    Attends meetings of clubs or organizations: Not on file    Relationship status: Not on file  Other  Topics Concern  . Not on file  Social History Narrative   Right handed      Family History:  The patient's family history includes Lung cancer in his brother; Multiple sclerosis in his father; Pneumonia in his mother.   ROS:   Please see the history of present illness.    ROS all other systems are reviewed and are negative PHYSICAL EXAM:   VS:  BP 126/77  Pulse 88   Ht 5\' 7"  (1.702 m)   Wt 190 lb 6.4 oz (86.4 kg)   BMI 29.82 kg/m      General: Alert, oriented x3, no distress, sitting on his rolling walker.  Healthy left subclavian pacemaker site. Head: no evidence of trauma, PERRL, EOMI, no exophtalmos or lid lag, no myxedema, no xanthelasma; normal ears, nose and oropharynx Neck: normal jugular venous pulsations and no hepatojugular reflux; brisk carotid pulses without delay and no carotid bruits Chest: clear to auscultation, no signs of consolidation by percussion or palpation, normal fremitus, symmetrical and full respiratory excursions Cardiovascular: normal position and quality of the apical impulse, regular rhythm, normal first and widely split second heart sounds, no murmurs, rubs or gallops Abdomen: no tenderness or distention, no masses by palpation, no abnormal pulsatility or arterial bruits, normal bowel sounds, no hepatosplenomegaly Extremities: no clubbing, cyanosis or edema; 2+ radial, ulnar and brachial pulses bilaterally; 2+ right femoral, posterior tibial and dorsalis pedis pulses; 2+ left femoral, posterior tibial and dorsalis pedis pulses; no subclavian or femoral bruits Neurological: grossly nonfocal Psych: Normal mood and affect   Wt Readings from Last 3 Encounters:  03/29/19 190 lb 6.4 oz (86.4 kg)  03/08/19 192 lb (87.1 kg)  02/12/19 193 lb (87.5 kg)      Studies/Labs Reviewed:   EKG:  EKG is not ordered today.  The electrocardiogram from March 08, 2019 shows atrial paced, ventricular sensed rhythm with a pre-existing right bundle branch block, no  ischemic changes Recent Labs: 04/21/2018: ALT 16 03/08/2019: BUN 21; Creatinine, Ser 1.17; Hemoglobin 15.5; Platelets 178; Potassium 4.2; Sodium 138 03/26/2019: TSH 2.670   Lipid Panel    Component Value Date/Time   CHOL 107 03/26/2019 1538   TRIG 286 (H) 03/26/2019 1538   HDL 34 (L) 03/26/2019 1538   CHOLHDL 3.1 03/26/2019 1538   CHOLHDL 2.8 01/31/2014 0714   VLDL 24 01/31/2014 0714   LDLCALC 39 04/21/2018 0854    ASSESSMENT:    1. Second degree AV block   2. Pacemaker   3. Coronary artery disease involving native coronary artery of native heart without angina pectoris   4. Essential hypertension   5. Hypercholesterolemia   6. Diabetes mellitus type 2 in nonobese [facility])      PLAN:  In order of problems listed above:  1. Second degree AV block: Ventricular pacing is required very infrequently. 2. PPM: Normal device function.  Remote download in 3 months.  Manual testing of lead parameters was performed today with excellent pacing threshold on both the atrial and ventricular lead. 3. CAD s/p CABG: Asymptomatic 4. HTN: Well-controlled.  His blood pressure was also normal during his episodes of reduced responsiveness. 5. HLP: On statin, excellent LDL 6. DM: Well-controlled without medications 7. His episodes of reduced responsiveness do not sound cardiovascular.  Normal blood pressure has been documented during 1 of these spells and he has a normally functioning pacemaker that has not recorded episodes of tachyarrhythmia and will not allow bradycardia.  The episodes have occurred in both a supine and a sitting position.  Suspect that there are neurological causes.    Medication Adjustments/Labs and Tests Ordered: Current medicines are reviewed at length with the patient today.  Concerns regarding medicines are outlined above.  Medication changes, Labs and Tests ordered today are listed in the Patient Instructions below. Patient Instructions  Medication Instructions:  Your  physician recommends that you continue on your current medications as directed. Please refer to the Current Medication  list given to you today.  If you need a refill on your cardiac medications before your next appointment, please call your pharmacy.    Follow-Up: At [facility], you and your health needs are our priority.  As part of our continuing mission to provide you with exceptional heart care, we have created designated Provider Care Teams.  These Care Teams include your primary Cardiologist (physician) and Advanced Practice Providers (APPs -  Physician Assistants and Nurse Practitioners) who all work together to provide you with the care you need, when you need it. You will need a follow up appointment in 12 months.  Please call our office 2 months in advance to schedule this appointment.  You may see Dr. Sallyanne Kuster or one of the following Advanced Practice Providers on your designated Care Team: Almyra Deforest, [place] . Fabian Sharp, PA-C        Signed, Sanda Klein, MD  03/29/2019 3:45 PM    [facility] [address], [place], [place]  [address] Phone: [phone]; Fax: [phone]

## 2019-03-30 ENCOUNTER — Other Ambulatory Visit: Payer: Self-pay

## 2019-03-30 MED ORDER — GLUCOSE BLOOD VI STRP: ORAL_STRIP | 99 refills | Status: DC

## 2019-04-05 DIAGNOSIS — N138 Other obstructive and reflux uropathy: Secondary | ICD-10-CM | POA: Diagnosis not present

## 2019-04-05 DIAGNOSIS — R8290 Unspecified abnormal findings in urine: Secondary | ICD-10-CM | POA: Diagnosis not present

## 2019-04-05 DIAGNOSIS — R972 Elevated prostate specific antigen [PSA]: Secondary | ICD-10-CM | POA: Diagnosis not present

## 2019-04-05 DIAGNOSIS — N401 Enlarged prostate with lower urinary tract symptoms: Secondary | ICD-10-CM | POA: Diagnosis not present

## 2019-04-18 ENCOUNTER — Other Ambulatory Visit: Payer: Self-pay | Admitting: Family Medicine

## 2019-04-19 ENCOUNTER — Telehealth: Payer: Self-pay | Admitting: Family Medicine

## 2019-04-19 NOTE — Telephone Encounter (Signed)
Needs RX written for a Transport Wheel chair so she can get him to his doctor's appts.  [employer]

## 2019-04-19 NOTE — Telephone Encounter (Signed)
Script is written at nurse station if you agree. Just need diagnosis on it and signature.

## 2019-04-21 ENCOUNTER — Encounter: Payer: Self-pay | Admitting: Family Medicine

## 2019-04-28 ENCOUNTER — Telehealth: Payer: Self-pay | Admitting: Family Medicine

## 2019-04-28 NOTE — Telephone Encounter (Signed)
I sent the last office visit note to them the same day dr Richardson Landry told me to. I will refax it today

## 2019-04-28 NOTE — Telephone Encounter (Signed)
I already did this. On the form I sent back up front on Friday I said to copy the note from the very last visit itis documented there

## 2019-04-28 NOTE — Telephone Encounter (Signed)
[place] contacted office and is needing face to face notes on why patient is needing a transport wheelchair. Vaughan Basta from [place] that the notes need to have a reason on why he is having frequent falls and why the transport chair is needed. Please advise. Thank you   Fax number to [name] [phone]

## 2019-05-20 ENCOUNTER — Telehealth: Payer: Self-pay | Admitting: Neurology

## 2019-05-20 NOTE — Telephone Encounter (Signed)
Patient's wife called and said he is continuing to have "blackout spells." Monday he had one that lasted 3 hours. Yesterday, he had one that lasted 2 hours. She said these are happening during the day and his breathing is fine but she cannot wake him. She'd like to know if it's okay to leave him be or if there is more she can do to help him.

## 2019-05-20 NOTE — Telephone Encounter (Signed)
Cardio states that it is no problems with his heart or pacemaker. Believes it is all neurological. Wife does not want to do the EEG because she feels it will only confuse the patient.  Wife questioned Lewy body dementia??  She just wants to know what to do when the pt has these episodes. Does she keep trying to wake him? Does she leave him alone? Does she call EMS? When pt awakes, its like nothing ever happened.  Wife fine to wait until Dr. Delice Lesch returns 11/16 to discuss

## 2019-05-25 NOTE — Telephone Encounter (Signed)
Left VM

## 2019-05-26 NOTE — Telephone Encounter (Signed)
He had 2 episodes last week, since then he has perked up, can walk through the house, still can't find the bathroom. When he has these spells, she just lets him sleep. No convulsive activity. He would not tolerate prolonged EEG. We discussed consideration for getting a pulse ox to ensure O2 and HR fine. She monitors his breathing and BP when he has the episodes. Discussed the possibility of fluctuating cognition seen with Dementia with Lewy Bodies, and starting Rivastigmine to see if this helps. She would like to hold off for now and continue on Memantine (had side effects on Donepezil). We discussed that if episodes increase in frequency, would start Rivastigmine. Agreed to monitor at home and if no red flags, no need to call EMS each time.

## 2019-06-07 ENCOUNTER — Ambulatory Visit (INDEPENDENT_AMBULATORY_CARE_PROVIDER_SITE_OTHER): Payer: Medicare Other | Admitting: *Deleted

## 2019-06-07 DIAGNOSIS — R001 Bradycardia, unspecified: Secondary | ICD-10-CM

## 2019-06-07 LAB — CUP PACEART REMOTE DEVICE CHECK
Battery Impedance: 475 Ohm
Battery Remaining Longevity: 109 mo
Battery Voltage: 2.79 V
Brady Statistic AP VP Percent: 0 %
Brady Statistic AP VS Percent: 34 %
Brady Statistic AS VP Percent: 0 %
Brady Statistic AS VS Percent: 66 %
Date Time Interrogation Session: 20201130091926
Implantable Lead Implant Date: 20130605
Implantable Lead Implant Date: 20130605
Implantable Lead Location: 753859
Implantable Lead Location: 753860
Implantable Lead Model: 5076
Implantable Lead Model: 5076
Implantable Pulse Generator Implant Date: 20130605
Lead Channel Impedance Value: 480 Ohm
Lead Channel Impedance Value: 868 Ohm
Lead Channel Pacing Threshold Amplitude: 0.375 V
Lead Channel Pacing Threshold Amplitude: 0.625 V
Lead Channel Pacing Threshold Pulse Width: 0.4 ms
Lead Channel Pacing Threshold Pulse Width: 0.4 ms
Lead Channel Setting Pacing Amplitude: 1.5 V
Lead Channel Setting Pacing Amplitude: 2 V
Lead Channel Setting Pacing Pulse Width: 0.4 ms
Lead Channel Setting Sensing Sensitivity: 4 mV

## 2019-06-30 NOTE — Progress Notes (Signed)
PPM remote 

## 2019-07-11 ENCOUNTER — Other Ambulatory Visit: Payer: Self-pay | Admitting: Family Medicine

## 2019-07-28 ENCOUNTER — Ambulatory Visit: Payer: Medicare Other | Attending: Internal Medicine

## 2019-07-28 DIAGNOSIS — Z23 Encounter for immunization: Secondary | ICD-10-CM | POA: Diagnosis not present

## 2019-07-28 NOTE — Progress Notes (Signed)
   Covid-19 Vaccination Clinic  Name:  Chad Martinez    MRN: [id number] DOB: 07/14/1934  07/28/2019  [name] was observed post Covid-19 immunization for 15 minutes without incidence. He was provided with Vaccine Information Sheet and instruction to access the V-Safe system.   [name] was instructed to call 911 with any severe reactions post vaccine: Marland Kitchen Difficulty breathing  . Swelling of your face and throat  . A fast heartbeat  . A bad rash all over your body  . Dizziness and weakness    Immunizations Administered    Name Date Dose VIS Date Route   Pfizer COVID-19 Vaccine 07/28/2019 12:09 AM 0.3 mL 06/18/2019 Intramuscular   Manufacturer: [place]   Lot: [id number]   [place]: [id number]

## 2019-08-12 ENCOUNTER — Encounter: Payer: Self-pay | Admitting: Family Medicine

## 2019-08-15 ENCOUNTER — Ambulatory Visit: Payer: Medicare Other | Attending: Internal Medicine

## 2019-08-15 DIAGNOSIS — Z23 Encounter for immunization: Secondary | ICD-10-CM

## 2019-08-15 NOTE — Progress Notes (Signed)
   Covid-19 Vaccination Clinic  Name:  Chad Martinez    MRN: [id number] DOB: October 12, 1934  08/15/2019  [name] was observed post Covid-19 immunization for 15 minutes without incidence. He was provided with Vaccine Information Sheet and instruction to access the V-Safe system.   [name] was instructed to call 911 with any severe reactions post vaccine: Marland Kitchen Difficulty breathing  . Swelling of your face and throat  . A fast heartbeat  . A bad rash all over your body  . Dizziness and weakness    Immunizations Administered    Name Date Dose VIS Date Route   Pfizer COVID-19 Vaccine 08/15/2019 11:45 AM 0.3 mL 06/18/2019 Intramuscular   Manufacturer: [place]   Lot: [id number]   [place]: [id number]

## 2019-08-17 ENCOUNTER — Other Ambulatory Visit: Payer: Self-pay

## 2019-08-17 ENCOUNTER — Encounter: Payer: Self-pay | Admitting: Neurology

## 2019-08-17 ENCOUNTER — Telehealth (INDEPENDENT_AMBULATORY_CARE_PROVIDER_SITE_OTHER): Payer: Medicare Other | Admitting: Neurology

## 2019-08-17 VITALS — Ht 68.0 in | Wt 200.0 lb

## 2019-08-17 DIAGNOSIS — F0391 Unspecified dementia with behavioral disturbance: Secondary | ICD-10-CM

## 2019-08-17 DIAGNOSIS — F03B18 Unspecified dementia, moderate, with other behavioral disturbance: Secondary | ICD-10-CM

## 2019-08-17 MED ORDER — RIVASTIGMINE TARTRATE 1.5 MG PO CAPS: 1.5000 mg | ORAL_CAPSULE | Freq: Two times a day (BID) | ORAL | 6 refills | Status: DC

## 2019-08-17 MED ORDER — MEMANTINE HCL 10 MG PO TABS: 10.0000 mg | ORAL_TABLET | Freq: Two times a day (BID) | ORAL | 3 refills | Status: DC

## 2019-08-17 NOTE — Progress Notes (Signed)
Virtual Visit via Video Note The purpose of this virtual visit is to provide medical care while limiting exposure to the novel coronavirus.    Consent was obtained for video visit:  Yes.   Answered questions that patient had about telehealth interaction:  Yes.   I discussed the limitations, risks, security and privacy concerns of performing an evaluation and management service by telemedicine. I also discussed with the patient that there may be a patient responsible charge related to this service. The patient expressed understanding and agreed to proceed.  Pt location: Home Physician Location: office Name of referring provider:  Mikey Kirschner, MD I connected with Chad Martinez at patients initiation/request on 08/17/2019 at  4:00 PM EST by video enabled telemedicine application and verified that I am speaking with the correct person using two identifiers. Pt MRN:  [id number] Pt DOB:  03-29-1980 Video Participants:  Chad Martinez;  Lanny Cramp (spouse), Fayrene Helper (daughter)   History of Present Illness:  The patient was seen as a virtual video visit on 08/17/2019. His wife and daughter are present during the visit to provide additional information. He was last seen 8 months ago for moderate dementia with behavioral disturbance. MMSE 16/30 in June 2020. He is on Memantine 10mg  BID without side effects. Since his last visit, he has had several more episodes where he would be unresponsive and unable to arouse. His vital signs would be normal. He had an EEG in June 2020 which showed mild diffuse slowing, no epileptiform discharges. Family does not feel he will be able to tolerate a prolonged EEG at home. Since his last visit, he has had several more episodes, 1 in Aug, 2 in Sept, 2 in Oct, 2 in Nov, 3 in December, none in January. When they try to wake him, touching his feet or legs would make him shake. He would be confused and a little agitated when he wakes up, and he would be weak for the  next day or so, needing his walker. No focal weakness. He does not have any tremors. He continues to have hallucinations which are non-threatening. He is not talking about them as much, but has delusions, "things he talks about is not reality." At night he does not know where he is. There are periods he does not recognize his family. He thinks the police are after him sometimes. Sleep is fair. He has urinary incontinence, worse at night. His wife assists with dressing and bathing. Sometimes he cannot find his way through the house. His gait is better some days than other. No falls.   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 He feels his memory has been getting worse and going downhill over the past year. Family started noticing changes around 1-1.5 years ago, he was initially leaving doors and drawers open, or leave the water running. Now he forgets to flush the toilet. His daughter first noticed that he was more quiet. He has always been very jovial and talks to everybody, but now he does not talk as much. He stopped driving in April [id number] after he went the wrong way and got confused, they attribute this to his vision. His wife has always been in charge of finances and medications, but now has to remind him to take his medications because he would get confused about AM/PM medications.  He likes to play Bingo but recently has had difficulty. His daughter also noticed that he would have slower processing/understanding when she talks to him. He follows instructions fine. There have been other behavioral changes as well, he is more anxious, pacing around the house. One time he got up at 2AM and showered, then the next morning recalled he took a shower and did not know why he did it. Over the past month he has had visual hallucinations where he  would see people in the woods behind their house. No auditory component. He is able to dress and bathe independently. He had an MMSE at PCP office in March 2019 which was 22/30 (24/30 in the summer).  His wife has noticed the past week that he seems to be slurring his words, his daughter describes it as difficulty finding the right word. He had urinary issues last July with urinary retention requiring self-catheterization, this has improved but since March he has had urinary incontinence and wears pull ups at night and when going out. He denies any headaches, dizziness, diplopia, dysphagia, neck/back pain, focal numbness/tingling/weakness, bowel dysfunction, anosmia, or tremors. He fell from their house 5 years ago and was found to have AV block with pacemaker placed. No family history of dementia. No history of significant head injuries or alcohol intake.   I personally reviewed head CT without contrast done 10/07/17 which did not show any acute changes, there was mild diffuse atrophy and chronic microvascular disease.       Current Outpatient Medications on File Prior to Visit  Medication Sig Dispense Refill  . aspirin EC 81 MG tablet Take 81 mg by mouth daily.    Marland Kitchen escitalopram (LEXAPRO) 20 MG tablet Take 1 tablet (20 mg total) by mouth daily. 90 tablet 3  . finasteride (PROSCAR) 5 MG tablet Take 1 tablet by mouth daily.    Marland Kitchen glucose blood test strip Tests sugar once a day- DX E11.9 50 each prn  . lisinopril (ZESTRIL) 20 MG tablet Take 1 tablet (20 mg total) by mouth daily. 90 tablet 3  . metoprolol tartrate (LOPRESSOR) 25 MG tablet Take 1 tablet (25 mg total) by mouth 2 (two) times daily. 180 tablet 1  . nitroGLYCERIN (NITROSTAT) 0.4 MG SL tablet Place 1 tablet (0.4 mg total) under the tongue every 5 (five) minutes as needed for chest pain (x 3 doses). 25 tablet 3  . simvastatin (ZOCOR) 80 MG tablet TAKE 1 TABLET BY MOUTH AT BEDTIME 90 tablet 0  . tamsulosin (FLOMAX) 0.4 MG CAPS capsule Take 1  capsule (0.4 mg total) by mouth daily. 30 capsule 0  . chlorthalidone (HYGROTON) 25 MG tablet Take 1 tablet (25 mg total) by mouth daily. 90 tablet 3   No current facility-administered medications on file prior to visit.     Observations/Objective:   Vitals:   08/17/19 1017  Weight: 200 lb (90.7 kg)  Height: 5\' 8"  (1.727 m)   GEN:  The patient appears stated age and is in NAD.  Neurological examination: Patient is awake, alert, oriented to person, place. No aphasia or dysarthria. Reduced fluency,able to follow commands. Remote and recent memory impaired, 0/3 delayed recall.  Cranial nerves: Extraocular movements intact with no nystagmus. No facial asymmetry. Motor: moves all extremities symmetrically, at least anti-gravity x 4. No incoordination on finger to nose testing. Gait: narrow-based and steady, no ataxia or shuffling gait noted. No tremors.  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 MMSE in June 2020 was 16/30 (25/30 in October 2019 and May 2019). He has hallucinations and recurrent episodes where family is unable to arouse him. EEG normal, consideration for episodes of fluctuating cognition seen in dementia with Lewy Body. We discussed adding on Rivastigmine 1.5 mg BID, side effecdts discussed. Continue Memantine 10mg  BID and Lexapro 20mg  daily. Family will call for an update in 2 months, if no side effects, will increase rivastigmine dose. Continue 24/7 care. He will follow-up in 6 months and knows to call for any changes.   Follow Up Instructions:   -I discussed the assessment and treatment plan with the patient. The patient was provided an opportunity to ask questions and all were answered. The patient agreed with the plan and demonstrated an understanding of the instructions.   The patient was advised to call back or seek an  in-person evaluation if the symptoms worsen or if the condition fails to improve as anticipated.    Cameron Sprang, MD

## 2019-08-22 ENCOUNTER — Ambulatory Visit: Payer: Medicare Other

## 2019-09-06 ENCOUNTER — Ambulatory Visit (INDEPENDENT_AMBULATORY_CARE_PROVIDER_SITE_OTHER): Payer: Medicare Other | Admitting: *Deleted

## 2019-09-06 DIAGNOSIS — R001 Bradycardia, unspecified: Secondary | ICD-10-CM | POA: Diagnosis not present

## 2019-09-06 LAB — CUP PACEART REMOTE DEVICE CHECK
Battery Impedance: 500 Ohm
Battery Remaining Longevity: 107 mo
Battery Voltage: 2.78 V
Brady Statistic AP VP Percent: 0 %
Brady Statistic AP VS Percent: 36 %
Brady Statistic AS VP Percent: 0 %
Brady Statistic AS VS Percent: 63 %
Date Time Interrogation Session: 20210301081906
Implantable Lead Implant Date: 20130605
Implantable Lead Implant Date: 20130605
Implantable Lead Location: 753859
Implantable Lead Location: 753860
Implantable Lead Model: 5076
Implantable Lead Model: 5076
Implantable Pulse Generator Implant Date: 20130605
Lead Channel Impedance Value: 467 Ohm
Lead Channel Impedance Value: 842 Ohm
Lead Channel Pacing Threshold Amplitude: 0.5 V
Lead Channel Pacing Threshold Amplitude: 0.625 V
Lead Channel Pacing Threshold Pulse Width: 0.4 ms
Lead Channel Pacing Threshold Pulse Width: 0.4 ms
Lead Channel Setting Pacing Amplitude: 1.5 V
Lead Channel Setting Pacing Amplitude: 2 V
Lead Channel Setting Pacing Pulse Width: 0.4 ms
Lead Channel Setting Sensing Sensitivity: 4 mV

## 2019-09-06 NOTE — Progress Notes (Signed)
PPM Remote  

## 2019-09-26 ENCOUNTER — Other Ambulatory Visit: Payer: Self-pay | Admitting: Family Medicine

## 2019-09-27 NOTE — Telephone Encounter (Signed)
Ok times one rec chronic visit

## 2019-09-27 NOTE — Telephone Encounter (Signed)
Scheduled 4/19

## 2019-10-04 ENCOUNTER — Telehealth: Payer: Self-pay

## 2019-10-04 MED ORDER — RIVASTIGMINE TARTRATE 3 MG PO CAPS: 3.0000 mg | ORAL_CAPSULE | Freq: Two times a day (BID) | ORAL | 0 refills | Status: DC

## 2019-10-04 NOTE — Addendum Note (Signed)
Addended by: Amado Coe on: 10/04/2019 03:59 PM   Modules accepted: Orders

## 2019-10-04 NOTE — Telephone Encounter (Signed)
Patients wife agreeable to medication increase.  Rivastigmine sent to [facility].

## 2019-10-04 NOTE — Telephone Encounter (Signed)
Per patients wife she is not seeing any difference.  They deny any missed dose.  Should patient stay on medication?

## 2019-10-04 NOTE — Telephone Encounter (Signed)
Spouse Nevin Bloodgood called to discuss with Delice Lesch how pt is responding to medication. Please return call [phone]. Not urgent per Nevin Bloodgood.

## 2019-10-04 NOTE — Addendum Note (Signed)
Addended by: Amado Coe on: 10/04/2019 03:40 PM   Modules accepted: Orders

## 2019-10-04 NOTE — Telephone Encounter (Signed)
If no side effects, I would try increasing dose of Rivastigmine to 3mg  BID, if they are okay with doing this, pls send in Rx, thanks

## 2019-10-04 NOTE — Telephone Encounter (Signed)
Left message for patients husband to contact us to let us know if she is interested in him increasing dose to Rivastigmnine 3mg  BID?

## 2019-10-18 DIAGNOSIS — N138 Other obstructive and reflux uropathy: Secondary | ICD-10-CM | POA: Diagnosis not present

## 2019-10-18 DIAGNOSIS — N401 Enlarged prostate with lower urinary tract symptoms: Secondary | ICD-10-CM | POA: Diagnosis not present

## 2019-10-18 DIAGNOSIS — R972 Elevated prostate specific antigen [PSA]: Secondary | ICD-10-CM | POA: Diagnosis not present

## 2019-10-22 ENCOUNTER — Encounter: Payer: Self-pay | Admitting: Family Medicine

## 2019-10-22 ENCOUNTER — Other Ambulatory Visit: Payer: Self-pay

## 2019-10-22 ENCOUNTER — Ambulatory Visit (INDEPENDENT_AMBULATORY_CARE_PROVIDER_SITE_OTHER): Payer: Medicare Other | Admitting: Family Medicine

## 2019-10-22 VITALS — BP 134/90 | Temp 98.0°F | Wt 189.0 lb

## 2019-10-22 DIAGNOSIS — Z79899 Other long term (current) drug therapy: Secondary | ICD-10-CM

## 2019-10-22 DIAGNOSIS — I10 Essential (primary) hypertension: Secondary | ICD-10-CM | POA: Diagnosis not present

## 2019-10-22 DIAGNOSIS — E119 Type 2 diabetes mellitus without complications: Secondary | ICD-10-CM | POA: Diagnosis not present

## 2019-10-22 DIAGNOSIS — F0390 Unspecified dementia without behavioral disturbance: Secondary | ICD-10-CM | POA: Diagnosis not present

## 2019-10-22 DIAGNOSIS — E785 Hyperlipidemia, unspecified: Secondary | ICD-10-CM

## 2019-10-22 LAB — POCT GLYCOSYLATED HEMOGLOBIN (HGB A1C): Hemoglobin A1C: 7.9 % — AB (ref 4.0–5.6)

## 2019-10-22 MED ORDER — SIMVASTATIN 80 MG PO TABS: 80.0000 mg | ORAL_TABLET | Freq: Every day | ORAL | 1 refills | Status: DC

## 2019-10-22 MED ORDER — METOPROLOL TARTRATE 25 MG PO TABS: 25.0000 mg | ORAL_TABLET | Freq: Two times a day (BID) | ORAL | 1 refills | Status: DC

## 2019-10-22 NOTE — Progress Notes (Signed)
   Subjective:    Patient ID: Chad Martinez, male    DOB: [birth year], 85 y.o.   MRN: [id number]  Diabetes He presents for his follow-up diabetic visit. He has type 2 diabetes mellitus. There are no hypoglycemic associated symptoms. (Pt is weak when he wakes up. Pt is in a come like state when he is sleeping and very difficult to arouse. Pt has also had spells with going into a coma like state while walking. Pt wife has spoke with neurologist regarding this issue. ) There are no hypoglycemic complications. There are no diabetic complications. Risk factors for coronary artery disease include hypertension and male sex. He does not see a podiatrist.Eye exam is current.  Hypertension This is a chronic problem. Risk factors for coronary artery disease include diabetes mellitus and male gender. There are no compliance problems.    Results for orders placed or performed in visit on 10/22/19  POCT HgB A1C  Result Value Ref Range   Hemoglobin A1C 7.9 (A) 4.0 - 5.6 %   HbA1c POC (<> result, manual entry)     HbA1c, POC (prediabetic range)     HbA1c, POC (controlled diabetic range)     Patient has progressive challenges with dementia.  At times has hallucinations.  Has also had progressive difficulty with ambulating.  Now mostly using a wheelchair.  The neurologist are utilizing several medications which seem to have helped somewhat.  Patient continues to see the urologist for prostate issues  Known coronary artery disease followed by cardiologist   Review of Systems No headache no chest pain no shortness of breath    Objective:   Physical Exam  Alert and oriented, vitals reviewed and stable, NAD ENT-TM's and ext canals WNL bilat via otoscopic exam Soft palate, tonsils and post pharynx WNL via oropharyngeal exam Neck-symmetric, no masses; thyroid nonpalpable and nontender Pulmonary-no tachypnea or accessory muscle use; Clear without wheezes via auscultation Card--no abnrml murmurs, rhythm reg  and rate WNL Carotid pulses symmetric, without bruits       Assessment & Plan:  Impression #1 type 2 diabetes.  A1c has risen a bit at 7.9.  This is still considered decent control for his situation.  Discussed no change in meds  2.  Hypertension.  Good control good numbers on repeat  3.  Hyperlipidemia.  Status uncertain.  Important with patient's known coronary artery disease  4.  Dementia.  This has many features of Lewy body disease.  The neurologist has assessed potential working diagnosis  Maintain on medications.  Follow-up in 6 months.  Diet discussed.  General questions answered

## 2019-10-23 LAB — LIPID PANEL
Chol/HDL Ratio: 3.6 ratio (ref 0.0–5.0)
Cholesterol, Total: 105 mg/dL (ref 100–199)
HDL: 29 mg/dL — ABNORMAL LOW (ref >39)
LDL Chol Calc (NIH): 35 mg/dL (ref 0–99)
Triglycerides: 272 mg/dL — ABNORMAL HIGH (ref 0–149)
VLDL Cholesterol Cal: 41 mg/dL — ABNORMAL HIGH (ref 5–40)

## 2019-10-23 LAB — CBC WITH DIFFERENTIAL/PLATELET
Basophils Absolute: 0 10*3/uL (ref 0.0–0.2)
Basos: 1 %
EOS (ABSOLUTE): 0.2 10*3/uL (ref 0.0–0.4)
Eos: 2 %
Hematocrit: 42.3 % (ref 37.5–51.0)
Hemoglobin: 13.8 g/dL (ref 13.0–17.7)
Immature Grans (Abs): 0.1 10*3/uL (ref 0.0–0.1)
Immature Granulocytes: 1 %
Lymphocytes Absolute: 1.6 10*3/uL (ref 0.7–3.1)
Lymphs: 20 %
MCH: 29.5 pg (ref 26.6–33.0)
MCHC: 32.6 g/dL (ref 31.5–35.7)
MCV: 90 fL (ref 79–97)
Monocytes Absolute: 0.7 10*3/uL (ref 0.1–0.9)
Monocytes: 8 %
Neutrophils Absolute: 5.4 10*3/uL (ref 1.4–7.0)
Neutrophils: 68 %
Platelets: 158 10*3/uL (ref 150–450)
RBC: 4.68 x10E6/uL (ref 4.14–5.80)
RDW: 12.8 % (ref 11.6–15.4)
WBC: 8 10*3/uL (ref 3.4–10.8)

## 2019-10-23 LAB — HEPATIC FUNCTION PANEL
ALT: 21 IU/L (ref 0–44)
AST: 17 IU/L (ref 0–40)
Albumin: 4.2 g/dL (ref 3.6–4.6)
Alkaline Phosphatase: 68 IU/L (ref 39–117)
Bilirubin Total: 0.6 mg/dL (ref 0.0–1.2)
Bilirubin, Direct: 0.22 mg/dL (ref 0.00–0.40)
Total Protein: 6.4 g/dL (ref 6.0–8.5)

## 2019-10-23 LAB — BASIC METABOLIC PANEL
BUN/Creatinine Ratio: 16 (ref 10–24)
BUN: 19 mg/dL (ref 8–27)
CO2: 27 mmol/L (ref 20–29)
Calcium: 9 mg/dL (ref 8.6–10.2)
Chloride: 98 mmol/L (ref 96–106)
Creatinine, Ser: 1.16 mg/dL (ref 0.76–1.27)
GFR calc Af Amer: 67 mL/min/{1.73_m2} (ref >59)
GFR calc non Af Amer: 58 mL/min/{1.73_m2} — ABNORMAL LOW (ref >59)
Glucose: 217 mg/dL — ABNORMAL HIGH (ref 65–99)
Potassium: 3.9 mmol/L (ref 3.5–5.2)
Sodium: 136 mmol/L (ref 134–144)

## 2019-10-24 ENCOUNTER — Encounter: Payer: Self-pay | Admitting: Family Medicine

## 2019-10-25 ENCOUNTER — Ambulatory Visit: Payer: Medicare Other | Admitting: Family Medicine

## 2019-11-28 ENCOUNTER — Other Ambulatory Visit: Payer: Self-pay

## 2019-11-28 ENCOUNTER — Encounter (HOSPITAL_COMMUNITY): Payer: Self-pay | Admitting: Emergency Medicine

## 2019-11-28 ENCOUNTER — Emergency Department (HOSPITAL_COMMUNITY): Payer: Medicare Other

## 2019-11-28 ENCOUNTER — Emergency Department (HOSPITAL_COMMUNITY)
Admission: EM | Admit: 2019-11-28 | Discharge: 2019-11-28 | Disposition: A | Discharge status: Home / Self Care | Payer: Medicare Other | Attending: Emergency Medicine | Admitting: Emergency Medicine

## 2019-11-28 DIAGNOSIS — F0390 Unspecified dementia without behavioral disturbance: Secondary | ICD-10-CM | POA: Insufficient documentation

## 2019-11-28 DIAGNOSIS — R338 Other retention of urine: Secondary | ICD-10-CM

## 2019-11-28 DIAGNOSIS — I4510 Unspecified right bundle-branch block: Secondary | ICD-10-CM | POA: Diagnosis not present

## 2019-11-28 DIAGNOSIS — R339 Retention of urine, unspecified: Secondary | ICD-10-CM | POA: Diagnosis not present

## 2019-11-28 DIAGNOSIS — Z951 Presence of aortocoronary bypass graft: Secondary | ICD-10-CM | POA: Insufficient documentation

## 2019-11-28 DIAGNOSIS — I2510 Atherosclerotic heart disease of native coronary artery without angina pectoris: Secondary | ICD-10-CM | POA: Diagnosis not present

## 2019-11-28 DIAGNOSIS — R4020 Unspecified coma: Secondary | ICD-10-CM | POA: Diagnosis not present

## 2019-11-28 DIAGNOSIS — I10 Essential (primary) hypertension: Secondary | ICD-10-CM | POA: Insufficient documentation

## 2019-11-28 DIAGNOSIS — R34 Anuria and oliguria: Secondary | ICD-10-CM | POA: Diagnosis present

## 2019-11-28 DIAGNOSIS — Z7982 Long term (current) use of aspirin: Secondary | ICD-10-CM | POA: Diagnosis not present

## 2019-11-28 DIAGNOSIS — Z950 Presence of cardiac pacemaker: Secondary | ICD-10-CM | POA: Diagnosis not present

## 2019-11-28 DIAGNOSIS — E119 Type 2 diabetes mellitus without complications: Secondary | ICD-10-CM | POA: Diagnosis not present

## 2019-11-28 DIAGNOSIS — Z87891 Personal history of nicotine dependence: Secondary | ICD-10-CM | POA: Insufficient documentation

## 2019-11-28 DIAGNOSIS — R509 Fever, unspecified: Secondary | ICD-10-CM | POA: Diagnosis not present

## 2019-11-28 DIAGNOSIS — Z79899 Other long term (current) drug therapy: Secondary | ICD-10-CM | POA: Diagnosis not present

## 2019-11-28 LAB — URINALYSIS, ROUTINE W REFLEX MICROSCOPIC
Bilirubin Urine: NEGATIVE
Glucose, UA: NEGATIVE mg/dL
Hgb urine dipstick: NEGATIVE
Ketones, ur: NEGATIVE mg/dL
Leukocytes,Ua: NEGATIVE
Nitrite: NEGATIVE
Protein, ur: NEGATIVE mg/dL
Specific Gravity, Urine: 1.016 (ref 1.005–1.030)
pH: 5 (ref 5.0–8.0)

## 2019-11-28 LAB — COMPREHENSIVE METABOLIC PANEL
ALT: 23 U/L (ref 0–44)
AST: 19 U/L (ref 15–41)
Albumin: 3.8 g/dL (ref 3.5–5.0)
Alkaline Phosphatase: 52 U/L (ref 38–126)
Anion gap: 10 (ref 5–15)
BUN: 20 mg/dL (ref 8–23)
CO2: 27 mmol/L (ref 22–32)
Calcium: 8.6 mg/dL — ABNORMAL LOW (ref 8.9–10.3)
Chloride: 98 mmol/L (ref 98–111)
Creatinine, Ser: 1.04 mg/dL (ref 0.61–1.24)
GFR calc Af Amer: >60 mL/min (ref >60)
GFR calc non Af Amer: >60 mL/min (ref >60)
Glucose, Bld: 203 mg/dL — ABNORMAL HIGH (ref 70–99)
Potassium: 3.5 mmol/L (ref 3.5–5.1)
Sodium: 135 mmol/L (ref 135–145)
Total Bilirubin: 1.1 mg/dL (ref 0.3–1.2)
Total Protein: 6.7 g/dL (ref 6.5–8.1)

## 2019-11-28 LAB — CBC WITH DIFFERENTIAL/PLATELET
Abs Immature Granulocytes: 0.04 10*3/uL (ref 0.00–0.07)
Basophils Absolute: 0.1 10*3/uL (ref 0.0–0.1)
Basophils Relative: 1 %
Eosinophils Absolute: 0.2 10*3/uL (ref 0.0–0.5)
Eosinophils Relative: 3 %
HCT: 40 % (ref 39.0–52.0)
Hemoglobin: 13.4 g/dL (ref 13.0–17.0)
Immature Granulocytes: 1 %
Lymphocytes Relative: 17 %
Lymphs Abs: 1.3 10*3/uL (ref 0.7–4.0)
MCH: 29.7 pg (ref 26.0–34.0)
MCHC: 33.5 g/dL (ref 30.0–36.0)
MCV: 88.7 fL (ref 80.0–100.0)
Monocytes Absolute: 0.6 10*3/uL (ref 0.1–1.0)
Monocytes Relative: 8 %
Neutro Abs: 5.5 10*3/uL (ref 1.7–7.7)
Neutrophils Relative %: 70 %
Platelets: 149 10*3/uL — ABNORMAL LOW (ref 150–400)
RBC: 4.51 MIL/uL (ref 4.22–5.81)
RDW: 12.8 % (ref 11.5–15.5)
WBC: 7.7 10*3/uL (ref 4.0–10.5)
nRBC: 0 % (ref 0.0–0.2)

## 2019-11-28 LAB — LACTIC ACID, PLASMA
Lactic Acid, Venous: 1.5 mmol/L (ref 0.5–1.9)
Lactic Acid, Venous: 1.4 mmol/L (ref 0.5–1.9)

## 2019-11-28 LAB — APTT: aPTT: 25 seconds (ref 24–36)

## 2019-11-28 LAB — PROTIME-INR
INR: 1.1 (ref 0.8–1.2)
Prothrombin Time: 14.1 seconds (ref 11.4–15.2)

## 2019-11-28 NOTE — ED Triage Notes (Signed)
normally nonverbal   Has not urinated, nor defecated in several days   Pt spouse has in and out cathed   Pt w hx of dementia    VS stable  BS 236  IV 20 G L hand   Here for eval

## 2019-11-28 NOTE — Discharge Instructions (Addendum)
Follow-up with his urologist in [place].  Keep the Foley catheter in place empty it when it is full.  Renal function was normal today urinalysis without signs of any infection.

## 2019-11-28 NOTE — ED Provider Notes (Signed)
[facility] EMERGENCY DEPARTMENT Provider Note   CSN: [id number] Arrival date & time: 11/28/19  1142     History Chief Complaint  Patient presents with  . anurea    Chad Martinez is a 85 y.o. male.  Patient having difficulty voiding.  Patient's wife straight cath done last night.  And then he has not voided since.  There was a wet diaper though so it was a little bit confusing.  Patient in no distress.  Patient has a history of dementia.  Patient's had trouble with prostate problems in the past and has had your Rosanne Gutting retention in the past but not recently.        Past Medical History:  Diagnosis Date  . Colon polyps   . Coronary artery disease    s/p CABG x 6  . Detached retina    Partial  . Elevated PSA   . Enlarged prostate   . Hyperlipidemia   . Hypertension   . Pacemaker   . Pneumothorax 12/10/2011   right rib fx after fall from ladder  . Reflux   . Syncope    Permanent Pacemaker 12/11/2011 MDT Adapta    Patient Active Problem List   Diagnosis Date Noted  . Mild dementia ([place]) 11/26/2017  . AKI (acute kidney injury) ([place]) 02/04/2017  . Impingement syndrome of left shoulder 10/10/2016  . Type 2 diabetes mellitus without complication, without long-term current use of insulin ([place]) 10/10/2016  . CAD (coronary artery disease) 03/06/2016  . Shoulder pain, left 01/10/2016  . Benign prostatic hyperplasia with urinary obstruction 03/20/2015  . Pacemaker 12/19/2012  . Mobitz type 2 second degree AV block 12/19/2012  . Hx of CABG x 6 2008 12/19/2012  . Hyperlipemia 10/22/2012  . Elevated prostate specific antigen (PSA) 10/22/2012  . HTN (hypertension) 12/11/2011  . Bradycardia 12/10/2011  . Ribs, multiple fractures, right, closed, initial encounter 12/10/2011  . Pneumothorax, right 12/10/2011  . Retinal detachment 10/11/2011    Past Surgical History:  Procedure Laterality Date  . BYPASS GRAFT  10/08   LIMA to LAD,SVG to 2nd & 3rd obtuse marginal CX,SVG to  PDA,1st posterolateral and 2nd posterolateral RCA  . CARDIAC SURGERY    . COLONOSCOPY N/A 01/25/2013   Procedure: COLONOSCOPY;  Surgeon: Daneil Dolin, MD;  Location: [facility] ENDO SUITE;  Service: Endoscopy;  Laterality: N/A;  8:30 Am  . CORONARY ARTERY BYPASS GRAFT  04/19/2012  . EYE SURGERY  2004  . HEMORROIDECTOMY    . NM MYOVIEW LTD  11/05/2010   no ischemia  . PERMANENT PACEMAKER INSERTION  12/11/2011   MDT Adapta  . PERMANENT PACEMAKER INSERTION N/A 12/11/2011   Procedure: PERMANENT PACEMAKER INSERTION;  Surgeon: Sanda Klein, MD;  Location: [facility] CATH LAB;  Service: Cardiovascular;  Laterality: N/A;  . SKIN GRAFT  2012  . US ECHOCARDIOGRAPHY  10/14/2007   mild mitral annular ca+,EF 55%,AOV mildly sclerotic       Family History  Problem Relation Age of Onset  . Pneumonia Mother   . Multiple sclerosis Father   . Lung cancer Brother     Social History   Tobacco Use  . Smoking status: Former Smoker    Packs/day: 3.00    Years: 20.00    Pack years: 60.00    Quit date: 12/19/1974    Years since quitting: 44.9  . Smokeless tobacco: Never Used  Substance Use Topics  . Alcohol use: No  . Drug use: No    Home Medications Prior to Admission  medications   Medication Sig Start Date End Date Taking? Authorizing Provider  aspirin EC 81 MG tablet Take 81 mg by mouth at bedtime.    Yes [provider]  chlorthalidone (HYGROTON) 25 MG tablet Take 1 tablet (25 mg total) by mouth daily. Patient taking differently: Take 25 mg by mouth at bedtime.  01/19/19 11/28/19 Yes Lorretta Harp, MD  escitalopram (LEXAPRO) 20 MG tablet Take 1 tablet (20 mg total) by mouth daily. 12/31/18  Yes Cameron Sprang, MD  finasteride (PROSCAR) 5 MG tablet Take 5 mg by mouth in the morning.  04/21/17  Yes [provider]  lisinopril (ZESTRIL) 20 MG tablet Take 1 tablet (20 mg total) by mouth daily. Patient taking differently: Take 20 mg by mouth at bedtime.  01/19/19 11/28/19 Yes Lorretta Harp,  MD  memantine (NAMENDA) 10 MG tablet Take 1 tablet (10 mg total) by mouth 2 (two) times daily. 08/17/19  Yes Cameron Sprang, MD  metoprolol tartrate (LOPRESSOR) 25 MG tablet Take 1 tablet (25 mg total) by mouth 2 (two) times daily. 10/22/19  Yes Mikey Kirschner, MD  nitroGLYCERIN (NITROSTAT) 0.4 MG SL tablet Place 1 tablet (0.4 mg total) under the tongue every 5 (five) minutes as needed for chest pain (x 3 doses). 11/12/18  Yes Croitoru, Mihai, MD  rivastigmine (EXELON) 3 MG capsule Take 1 capsule (3 mg total) by mouth 2 (two) times daily. 10/04/19  Yes Cameron Sprang, MD  simvastatin (ZOCOR) 80 MG tablet Take 1 tablet (80 mg total) by mouth at bedtime. 10/22/19  Yes Mikey Kirschner, MD  tamsulosin (FLOMAX) 0.4 MG CAPS capsule Take 1 capsule (0.4 mg total) by mouth daily. Patient taking differently: Take 0.4 mg by mouth in the morning.  02/07/17  Yes Arrien, Jimmy Picket, MD    Allergies    Bactrim [sulfamethoxazole-trimethoprim]  Review of Systems   Review of Systems  Unable to perform ROS: Dementia    Physical Exam Updated Vital Signs BP 122/70   Pulse 78   Resp 16   Ht 1.829 m (6')   Wt 87.5 kg   SpO2 94%   BMI 26.16 kg/m   Physical Exam Vitals and nursing note reviewed.  Constitutional:      Appearance: Normal appearance. He is well-developed.  HENT:     Head: Normocephalic and atraumatic.  Eyes:     Extraocular Movements: Extraocular movements intact.     Conjunctiva/sclera: Conjunctivae normal.     Pupils: Pupils are equal, round, and reactive to light.  Cardiovascular:     Rate and Rhythm: Normal rate and regular rhythm.     Heart sounds: No murmur.  Pulmonary:     Effort: Pulmonary effort is normal. No respiratory distress.     Breath sounds: Normal breath sounds.  Abdominal:     General: There is no distension.     Palpations: Abdomen is soft.     Tenderness: There is no abdominal tenderness.  Genitourinary:    Penis: Normal.   Musculoskeletal:      Cervical back: Neck supple.  Skin:    General: Skin is warm and dry.  Neurological:     Mental Status: He is alert. Mental status is at baseline.     ED Results / Procedures / Treatments   Labs (all labs ordered are listed, but only abnormal results are displayed) Labs Reviewed  COMPREHENSIVE METABOLIC PANEL - Abnormal; Notable for the following components:      Result Value  Glucose, Bld 203 (*)    Calcium 8.6 (*)    All other components within normal limits  CBC WITH DIFFERENTIAL/PLATELET - Abnormal; Notable for the following components:   Platelets 149 (*)    All other components within normal limits  CULTURE, BLOOD (ROUTINE X 2)  CULTURE, BLOOD (ROUTINE X 2)  URINE CULTURE  LACTIC ACID, PLASMA  LACTIC ACID, PLASMA  APTT  PROTIME-INR  URINALYSIS, ROUTINE W REFLEX MICROSCOPIC    EKG EKG Interpretation  Date/Time:  Sunday Nov 28 2019 12:28:37 EDT Ventricular Rate:  77 PR Interval:    QRS Duration: 126 QT Interval:  398 QTC Calculation: 451 R Axis:   48 Text Interpretation: Sinus rhythm Right bundle branch block ATRIAL PACED RHYTHM Confirmed by Fredia Sorrow [phone]) on 11/28/2019 4:02:18 PM   Radiology DG Chest Port 1 View  Result Date: 11/28/2019 CLINICAL DATA:  Fevers EXAM: PORTABLE CHEST 1 VIEW COMPARISON:  03/08/2011 FINDINGS: Cardiac shadow is stable. Postsurgical changes are noted. Pacing device is again seen. The lungs are clear bilaterally. No acute bony abnormality is noted. IMPRESSION: No active disease. Electronically Signed   By: Inez Catalina M.D.   On: 11/28/2019 13:18    Procedures Procedures (including critical care time)  Medications Ordered in ED Medications - No data to display  ED Course  I have reviewed the triage vital signs and the nursing notes.  Pertinent labs & imaging results that were available during my care of the patient were reviewed by me and considered in my medical decision making (see chart for details).    MDM  Rules/Calculators/A&P                     Foley catheter was placed after doing bladder scan.  Bladder scan showed that he had greater than 600 cc of urine.  We put the Foley catheter and we got over 600 cc but less than 700.  Patient will keep the Foley catheter in and has been followed by urology in [place] in the past.  His wife will call to make an appointment.  Urinalysis negative for urinary tract infection.  No significant renal function abnormalities.  Vital signs without any significant abnormalities.     Final Clinical Impression(s) / ED Diagnoses Final diagnoses:  Acute urinary retention    Rx / DC Orders ED Discharge Orders    None       Fredia Sorrow, MD 11/28/19 1635

## 2019-11-30 ENCOUNTER — Telehealth: Payer: Self-pay | Admitting: Neurology

## 2019-11-30 LAB — URINE CULTURE: Culture: NO GROWTH

## 2019-11-30 NOTE — Telephone Encounter (Signed)
Patient's wife called in to let Dr. Delice Lesch know the patient had another "spell" and was "stiff as a board". His blood pressure was 190/102. He went to the hospital at [facility]. The hospital said it was due to his bladder not emptying. She is in touch with his Urologist.

## 2019-11-30 NOTE — Telephone Encounter (Signed)
Spoke to pt wife informed her that dr Delice Lesch reviewed the ER notes. Since the rivastigmine does not seem to be helping with the spells, Dr Delice Lesch  suggest we stop it since it can also cause urinary retention in some patients. Pt wife verbalized understanding

## 2019-11-30 NOTE — Telephone Encounter (Signed)
Pls let her know I reviewed the ER notes. Since the rivastigmine does not seem to be helping with the spells, I would suggest we stop it since it can also cause urinary retention in some patients. Thanks

## 2019-12-03 DIAGNOSIS — R339 Retention of urine, unspecified: Secondary | ICD-10-CM | POA: Diagnosis not present

## 2019-12-03 DIAGNOSIS — F0390 Unspecified dementia without behavioral disturbance: Secondary | ICD-10-CM | POA: Diagnosis not present

## 2019-12-03 DIAGNOSIS — N401 Enlarged prostate with lower urinary tract symptoms: Secondary | ICD-10-CM | POA: Diagnosis not present

## 2019-12-03 DIAGNOSIS — Z466 Encounter for fitting and adjustment of urinary device: Secondary | ICD-10-CM | POA: Diagnosis not present

## 2019-12-03 LAB — CULTURE, BLOOD (ROUTINE X 2)
Culture: NO GROWTH
Culture: NO GROWTH
Special Requests: ADEQUATE
Special Requests: ADEQUATE

## 2019-12-08 ENCOUNTER — Ambulatory Visit (INDEPENDENT_AMBULATORY_CARE_PROVIDER_SITE_OTHER): Payer: Medicare Other | Admitting: *Deleted

## 2019-12-08 DIAGNOSIS — I441 Atrioventricular block, second degree: Secondary | ICD-10-CM

## 2019-12-08 LAB — CUP PACEART REMOTE DEVICE CHECK
Battery Impedance: 575 Ohm
Battery Remaining Longevity: 101 mo
Battery Voltage: 2.78 V
Brady Statistic AP VP Percent: 0 %
Brady Statistic AP VS Percent: 37 %
Brady Statistic AS VP Percent: 0 %
Brady Statistic AS VS Percent: 63 %
Date Time Interrogation Session: 20210602092921
Implantable Lead Implant Date: 20130605
Implantable Lead Implant Date: 20130605
Implantable Lead Location: 753859
Implantable Lead Location: 753860
Implantable Lead Model: 5076
Implantable Lead Model: 5076
Implantable Pulse Generator Implant Date: 20130605
Lead Channel Impedance Value: 474 Ohm
Lead Channel Impedance Value: 891 Ohm
Lead Channel Pacing Threshold Amplitude: 0.5 V
Lead Channel Pacing Threshold Amplitude: 0.625 V
Lead Channel Pacing Threshold Pulse Width: 0.4 ms
Lead Channel Pacing Threshold Pulse Width: 0.4 ms
Lead Channel Setting Pacing Amplitude: 1.5 V
Lead Channel Setting Pacing Amplitude: 2 V
Lead Channel Setting Pacing Pulse Width: 0.4 ms
Lead Channel Setting Sensing Sensitivity: 4 mV

## 2019-12-09 NOTE — Progress Notes (Signed)
Remote pacemaker transmission.   

## 2019-12-10 DIAGNOSIS — R399 Unspecified symptoms and signs involving the genitourinary system: Secondary | ICD-10-CM | POA: Diagnosis not present

## 2019-12-24 ENCOUNTER — Other Ambulatory Visit: Payer: Self-pay

## 2019-12-24 ENCOUNTER — Encounter: Payer: Self-pay | Admitting: Nurse Practitioner

## 2019-12-24 ENCOUNTER — Ambulatory Visit (INDEPENDENT_AMBULATORY_CARE_PROVIDER_SITE_OTHER): Payer: Medicare Other | Admitting: Nurse Practitioner

## 2019-12-24 VITALS — BP 108/66 | Temp 97.8°F | Ht 72.0 in | Wt 190.0 lb

## 2019-12-24 DIAGNOSIS — R399 Unspecified symptoms and signs involving the genitourinary system: Secondary | ICD-10-CM

## 2019-12-24 DIAGNOSIS — R6881 Early satiety: Secondary | ICD-10-CM | POA: Diagnosis not present

## 2019-12-24 DIAGNOSIS — K219 Gastro-esophageal reflux disease without esophagitis: Secondary | ICD-10-CM | POA: Diagnosis not present

## 2019-12-24 LAB — POCT URINALYSIS DIPSTICK
Nitrite, UA: POSITIVE
Spec Grav, UA: <=1.005 — AB (ref 1.010–1.025)
pH, UA: 8 (ref 5.0–8.0)

## 2019-12-24 MED ORDER — OMEPRAZOLE 20 MG PO CPDR
20.0000 mg | DELAYED_RELEASE_CAPSULE | Freq: Every day | ORAL | 2 refills | Status: DC
Start: 2019-12-24 — End: 2020-02-15

## 2019-12-24 MED ORDER — CIPROFLOXACIN HCL 500 MG PO TABS
500.0000 mg | ORAL_TABLET | Freq: Two times a day (BID) | ORAL | 0 refills | Status: DC
Start: 2019-12-24 — End: 2020-02-15

## 2019-12-24 NOTE — Patient Instructions (Signed)
Gastroesophageal Reflux Disease, Adult Gastroesophageal reflux (GER) happens when acid from the stomach flows up into the tube that connects the mouth and the stomach (esophagus). Normally, food travels down the esophagus and stays in the stomach to be digested. With GER, food and stomach acid sometimes move back up into the esophagus. You may have a disease called gastroesophageal reflux disease (GERD) if the reflux:  Happens often.  Causes frequent or very bad symptoms.  Causes problems such as damage to the esophagus. When this happens, the esophagus becomes sore and swollen (inflamed). Over time, GERD can make small holes (ulcers) in the lining of the esophagus. What are the causes? This condition is caused by a problem with the muscle between the esophagus and the stomach. When this muscle is weak or not normal, it does not close properly to keep food and acid from coming back up from the stomach. The muscle can be weak because of:  Tobacco use.  Pregnancy.  Having a certain type of hernia (hiatal hernia).  Alcohol use.  Certain foods and drinks, such as coffee, chocolate, onions, and peppermint. What increases the risk? You are more likely to develop this condition if you:  Are overweight.  Have a disease that affects your connective tissue.  Use NSAID medicines. What are the signs or symptoms? Symptoms of this condition include:  Heartburn.  Difficult or painful swallowing.  The feeling of having a lump in the throat.  A bitter taste in the mouth.  Bad breath.  Having a lot of saliva.  Having an upset or bloated stomach.  Belching.  Chest pain. Different conditions can cause chest pain. Make sure you see your doctor if you have chest pain.  Shortness of breath or noisy breathing (wheezing).  Ongoing (chronic) cough or a cough at night.  Wearing away of the surface of teeth (tooth enamel).  Weight loss. How is this treated? Treatment will depend on how  bad your symptoms are. Your doctor may suggest:  Changes to your diet.  Medicine.  Surgery. Follow these instructions at home: Eating and drinking   Follow a diet as told by your doctor. You may need to avoid foods and drinks such as: ? Coffee and tea (with or without caffeine). ? Drinks that contain alcohol. ? Energy drinks and sports drinks. ? Bubbly (carbonated) drinks or sodas. ? Chocolate and cocoa. ? Peppermint and mint flavorings. ? Garlic and onions. ? Horseradish. ? Spicy and acidic foods. These include peppers, chili powder, curry powder, vinegar, hot sauces, and BBQ sauce. ? Citrus fruit juices and citrus fruits, such as oranges, lemons, and limes. ? Tomato-based foods. These include red sauce, chili, salsa, and pizza with red sauce. ? Fried and fatty foods. These include donuts, french fries, potato chips, and high-fat dressings. ? High-fat meats. These include hot dogs, rib eye steak, sausage, ham, and bacon. ? High-fat dairy items, such as whole milk, butter, and cream cheese.  Eat small meals often. Avoid eating large meals.  Avoid drinking large amounts of liquid with your meals.  Avoid eating meals during the 2-3 hours before bedtime.  Avoid lying down right after you eat.  Do not exercise right after you eat. Lifestyle   Do not use any products that contain nicotine or tobacco. These include cigarettes, e-cigarettes, and chewing tobacco. If you need help quitting, ask your doctor.  Try to lower your stress. If you need help doing this, ask your doctor.  If you are overweight, lose an amount   of weight that is healthy for you. Ask your doctor about a safe weight loss goal. General instructions  Pay attention to any changes in your symptoms.  Take over-the-counter and prescription medicines only as told by your doctor. Do not take aspirin, ibuprofen, or other NSAIDs unless your doctor says it is okay.  Wear loose clothes. Do not wear anything tight  around your waist.  Raise (elevate) the head of your bed about 6 inches (15 cm).  Avoid bending over if this makes your symptoms worse.  Keep all follow-up visits as told by your doctor. This is important. Contact a doctor if:  You have new symptoms.  You lose weight and you do not know why.  You have trouble swallowing or it hurts to swallow.  You have wheezing or a cough that keeps happening.  Your symptoms do not get better with treatment.  You have a hoarse voice. Get help right away if:  You have pain in your arms, neck, jaw, teeth, or back.  You feel sweaty, dizzy, or light-headed.  You have chest pain or shortness of breath.  You throw up (vomit) and your throw-up looks like blood or coffee grounds.  You pass out (faint).  Your poop (stool) is bloody or black.  You cannot swallow, drink, or eat. Summary  If a person has gastroesophageal reflux disease (GERD), food and stomach acid move back up into the esophagus and cause symptoms or problems such as damage to the esophagus.  Treatment will depend on how bad your symptoms are.  Follow a diet as told by your doctor.  Take all medicines only as told by your doctor. This information is not intended to replace advice given to you by your health care provider. Make sure you discuss any questions you have with your health care provider. Document Revised: 12/31/2017 Document Reviewed: 12/31/2017 Elsevier Patient Education  2020 Elsevier Inc.  Food Choices for Gastroesophageal Reflux Disease, Adult When you have gastroesophageal reflux disease (GERD), the foods you eat and your eating habits are very important. Choosing the right foods can help ease your discomfort. Think about working with a nutrition specialist (dietitian) to help you make good choices. What are tips for following this plan?  Meals  Choose healthy foods that are low in fat, such as fruits, vegetables, whole grains, low-fat dairy products, and  lean meat, fish, and poultry.  Eat small meals often instead of 3 large meals a day. Eat your meals slowly, and in a place where you are relaxed. Avoid bending over or lying down until 2-3 hours after eating.  Avoid eating meals 2-3 hours before bed.  Avoid drinking a lot of liquid with meals.  Cook foods using methods other than frying. Bake, grill, or broil food instead.  Avoid or limit: ? Chocolate. ? Peppermint or spearmint. ? Alcohol. ? Pepper. ? Black and decaffeinated coffee. ? Black and decaffeinated tea. ? Bubbly (carbonated) soft drinks. ? Caffeinated energy drinks and soft drinks.  Limit high-fat foods such as: ? Fatty meat or fried foods. ? Whole milk, cream, butter, or ice cream. ? Nuts and nut butters. ? Pastries, donuts, and sweets made with butter or shortening.  Avoid foods that cause symptoms. These foods may be different for everyone. Common foods that cause symptoms include: ? Tomatoes. ? Oranges, lemons, and limes. ? Peppers. ? Spicy food. ? Onions and garlic. ? Vinegar. Lifestyle  Maintain a healthy weight. Ask your doctor what weight is healthy for you. If you   need to lose weight, work with your doctor to do so safely.  Exercise for at least 30 minutes for 5 or more days each week, or as told by your doctor.  Wear loose-fitting clothes.  Do not smoke. If you need help quitting, ask your doctor.  Sleep with the head of your bed higher than your feet. Use a wedge under the mattress or blocks under the bed frame to raise the head of the bed. Summary  When you have gastroesophageal reflux disease (GERD), food and lifestyle choices are very important in easing your symptoms.  Eat small meals often instead of 3 large meals a day. Eat your meals slowly, and in a place where you are relaxed.  Limit high-fat foods such as fatty meat or fried foods.  Avoid bending over or lying down until 2-3 hours after eating.  Avoid peppermint and spearmint,  caffeine, alcohol, and chocolate. This information is not intended to replace advice given to you by your health care provider. Make sure you discuss any questions you have with your health care provider. Document Revised: 10/15/2018 Document Reviewed: 07/30/2016 Elsevier Patient Education  2020 Elsevier Inc.  

## 2019-12-24 NOTE — Progress Notes (Signed)
Subjective:    Patient ID: Chad Martinez, male    DOB: Jun 28, 1935, 85 y.o.   MRN: [id number]  HPI had uti 2 weeks ago. Took amoxil. Then 2 days ago wife noticed an odor and urine was cloudy. See urology  Dr. Lawerance Bach.  His wife is present with him today and is providing all of the history. Was seen for acute urinary retention in the local ED on 11/28/2019.  Had an in and out cath with a urine culture which showed no growth. Was seen at [facility] urology on 12/10/2019.  Started on amoxicillin at that time. Urine was positive for nitrites with moderate leukocytes.  Urine culture showed less than 10,000 colonies.  Symptoms seem to improve while on amoxicillin but came back.  Has noticed an odor to his urine.  Has also noticed an increase in his confusion, patient has dementia.  No fever.  No abdominal pain.  No flank pain.  Has noticed some early satiety when eating.  No accurate weight today patient is in a wheelchair. Has a history of BPH, his wife states his prostate was so enlarged at 1 point that he could not urinate.    Review of Systems     Objective:   Physical Exam NAD.  Alert.  Minimally vocal but making good eye contact and cooperative.  Lungs clear.  Heart regular rate rhythm.  No flank tenderness.  Abdomen soft nondistended with distinct epigastric area tenderness and guarding.  The remainder of the abdominal exam is unremarkable. Results for orders placed or performed in visit on 12/24/19  POCT urinalysis dipstick  Result Value Ref Range   Color, UA     Clarity, UA     Glucose, UA     Bilirubin, UA     Ketones, UA     Spec Grav, UA <=1.005 (A) 1.010 - 1.025   Blood, UA     pH, UA 8.0 5.0 - 8.0   Protein, UA     Urobilinogen, UA     Nitrite, UA positive    Leukocytes, UA Large (3+) (A) Negative   Appearance     Odor    POCT UA - Microscopic Only  Result Value Ref Range   WBC, Ur, HPF, POC 2-3+    RBC, urine, microscopic neg    Bacteria, U Microscopic neg    Mucus, UA      Epithelial cells, urine per micros neg    Crystals, Ur, HPF, POC     Casts, Ur, LPF, POC     Yeast, UA           Assessment & Plan:   Problem List Items Addressed This Visit      Digestive   Gastroesophageal reflux disease without esophagitis   Relevant Medications   omeprazole (PRILOSEC) 20 MG capsule     Other   Early satiety    Other Visit Diagnoses    UTI symptoms    -  Primary   Relevant Orders   POCT urinalysis dipstick (Completed)   POCT UA - Microscopic Only (Completed)     Meds ordered this encounter  Medications   omeprazole (PRILOSEC) 20 MG capsule    Sig: Take 1 capsule (20 mg total) by mouth daily. Prn GERD    Dispense:  30 capsule    Refill:  2    Order Specific Question:   Supervising Provider    Answer:   Sallee Lange A [[id number]]   ciprofloxacin (CIPRO) 500 MG  tablet    Sig: Take 1 tablet (500 mg total) by mouth 2 (two) times daily.    Dispense:  14 tablet    Refill:  0    Order Specific Question:   Supervising Provider    Answer:   Sallee Lange A [[id number]]   Start Cipro as directed.  Reviewed potential risk associated with with use of Cipro including tendon rupture and C. difficile diarrhea.  Recommend follow-up with urology if his symptoms persist. Start omeprazole as directed.  Given written and verbal information on reflux.  Advised his wife to watch his diet to see if his appetite improves and if no further pain when she presses on the epigastric area.  If symptoms persist over the next 3 to 4 weeks, contact office. Go to ED or urgent care if fever vomiting or severe pain.

## 2019-12-25 ENCOUNTER — Encounter: Payer: Self-pay | Admitting: Nurse Practitioner

## 2019-12-25 DIAGNOSIS — R6881 Early satiety: Secondary | ICD-10-CM | POA: Insufficient documentation

## 2019-12-25 DIAGNOSIS — K219 Gastro-esophageal reflux disease without esophagitis: Secondary | ICD-10-CM | POA: Insufficient documentation

## 2019-12-25 LAB — POCT UA - MICROSCOPIC ONLY
Bacteria, U Microscopic: NEGATIVE
Epithelial cells, urine per micros: NEGATIVE
RBC, urine, microscopic: NEGATIVE

## 2019-12-26 ENCOUNTER — Other Ambulatory Visit: Payer: Self-pay | Admitting: Cardiovascular Disease

## 2020-02-15 ENCOUNTER — Encounter: Payer: Self-pay | Admitting: Cardiovascular Disease

## 2020-02-15 ENCOUNTER — Ambulatory Visit (INDEPENDENT_AMBULATORY_CARE_PROVIDER_SITE_OTHER): Payer: Medicare Other | Admitting: Cardiovascular Disease

## 2020-02-15 ENCOUNTER — Other Ambulatory Visit: Payer: Self-pay

## 2020-02-15 DIAGNOSIS — I10 Essential (primary) hypertension: Secondary | ICD-10-CM | POA: Diagnosis not present

## 2020-02-15 DIAGNOSIS — Z950 Presence of cardiac pacemaker: Secondary | ICD-10-CM

## 2020-02-15 DIAGNOSIS — Z951 Presence of aortocoronary bypass graft: Secondary | ICD-10-CM | POA: Diagnosis not present

## 2020-02-15 DIAGNOSIS — E782 Mixed hyperlipidemia: Secondary | ICD-10-CM

## 2020-02-15 NOTE — Assessment & Plan Note (Signed)
History of permanent transvenous pacemaker insertion for second-degree AV block by Dr. Sallyanne Kuster using a Medtronic Adapta device.  Dr. Sallyanne Kuster follows him on an annual basis.

## 2020-02-15 NOTE — Patient Instructions (Signed)
Medication Instructions:  Your physician recommends that you continue on your current medications as directed. Please refer to the Current Medication list given to you today.  Follow-Up: At [facility], you and your health needs are our priority.  As part of our continuing mission to provide you with exceptional heart care, we have created designated Provider Care Teams.  These Care Teams include your primary Cardiologist (physician) and Advanced Practice Providers (APPs -  Physician Assistants and Nurse Practitioners) who all work together to provide you with the care you need, when you need it.  We recommend signing up for the patient portal called "MyChart".  Sign up information is provided on this After Visit Summary.  MyChart is used to connect with patients for Virtual Visits (Telemedicine).  Patients are able to view lab/test results, encounter notes, upcoming appointments, etc.  Non-urgent messages can be sent to your provider as well.   To learn more about what you can do with MyChart, go to [email or web address].    Your next appointment:   12 month(s)  The format for your next appointment:   In Person  Provider:   Quay Burow, MD

## 2020-02-15 NOTE — Assessment & Plan Note (Signed)
History of essential hypertension blood pressure measured today 132/70.  He is on chlorthalidone, lisinopril and metoprolol.

## 2020-02-15 NOTE — Progress Notes (Signed)
02/15/2020 Chad Martinez   [birth year]  [id number]  Primary Physician Erven Colla, DO Primary Cardiologist: Lorretta Harp MD Lupe Carney, [name]  HPI:  Chad Martinez is a 85 y.o. male mildly overweight married Caucasian male, father of 7, who is accompanied by his wife today. I last saw him in the office  02/12/2019. He has a history of CAD, status post coronary artery bypass grafting x6, April 20, 2007, by Dr. Gilford Raid. His other problems include remote tobacco abuse and hyperlipidemia. He is asymptomatic. He vacations 6 months of the year in [place], [place], near [place]. He had an episode of syncope with high-grade second-degree AV block requiring dual-chamber pacemaker implantation by Dr. Sanda Klein with a Medtronic Adapta device. Dr. Sallyanne Kuster last saw him in the office in June of last year. The patient has remote interrogation via [facility] and is scheduled to see Dr. Sallyanne Kuster back in the office this coming August . He did have a Myoview tress test performed 12/21/2015 which is low risk.   Since I saw him a year ago he is done well.  He denies chest pain or shortness of breath.    He has had progressive dementia over the last year and now is wheelchair-bound.  I have made him a DNR today after discussion with his wife.   Current Meds  Medication Sig  . aspirin EC 81 MG tablet Take 81 mg by mouth at bedtime.   . chlorthalidone (HYGROTON) 25 MG tablet Take 1 tablet by mouth once daily  . escitalopram (LEXAPRO) 20 MG tablet Take 1 tablet (20 mg total) by mouth daily.  . finasteride (PROSCAR) 5 MG tablet Take 5 mg by mouth in the morning.   Marland Kitchen lisinopril (ZESTRIL) 20 MG tablet Take 1 tablet by mouth once daily  . memantine (NAMENDA) 10 MG tablet Take 1 tablet (10 mg total) by mouth 2 (two) times daily.  . metoprolol tartrate (LOPRESSOR) 25 MG tablet Take 1 tablet (25 mg total) by mouth 2 (two) times daily.  . nitroGLYCERIN (NITROSTAT) 0.4 MG SL tablet Place 1  tablet (0.4 mg total) under the tongue every 5 (five) minutes as needed for chest pain (x 3 doses).  . simvastatin (ZOCOR) 80 MG tablet Take 1 tablet (80 mg total) by mouth at bedtime.  . tamsulosin (FLOMAX) 0.4 MG CAPS capsule Take 1 capsule (0.4 mg total) by mouth daily. (Patient taking differently: Take 0.4 mg by mouth in the morning. )     Allergies  Allergen Reactions  . Bactrim [Sulfamethoxazole-Trimethoprim] Rash    Social History   Socioeconomic History  . Marital status: Married    Spouse name: Not on file  . Number of children: 4  . Years of education: 63  . Highest education level: Some college, no degree  Occupational History  . Occupation: retired  Tobacco Use  . Smoking status: Former Smoker    Packs/day: 3.00    Years: 20.00    Pack years: 60.00    Quit date: 12/19/1974    Years since quitting: 45.1  . Smokeless tobacco: Never Used  Vaping Use  . Vaping Use: Never used  Substance and Sexual Activity  . Alcohol use: No  . Drug use: No  . Sexual activity: Not Currently  Other Topics Concern  . Not on file  Social History Narrative   Right handed    Social Determinants of Health   Financial Resource Strain:   . Difficulty of  Paying Living Expenses:   Food Insecurity:   . Worried About Charity fundraiser in the Last Year:   . Arboriculturist in the Last Year:   Transportation Needs:   . Film/video editor (Medical):   Marland Kitchen Lack of Transportation (Non-Medical):   Physical Activity:   . Days of Exercise per Week:   . Minutes of Exercise per Session:   Stress:   . Feeling of Stress :   Social Connections:   . Frequency of Communication with Friends and Family:   . Frequency of Social Gatherings with Friends and Family:   . Attends Religious Services:   . Active Member of Clubs or Organizations:   . Attends Archivist Meetings:   Marland Kitchen Marital Status:   Intimate Partner Violence:   . Fear of Current or Ex-Partner:   . Emotionally Abused:     Marland Kitchen Physically Abused:   . Sexually Abused:      Review of Systems: General: negative for chills, fever, night sweats or weight changes.  Cardiovascular: negative for chest pain, dyspnea on exertion, edema, orthopnea, palpitations, paroxysmal nocturnal dyspnea or shortness of breath Dermatological: negative for rash Respiratory: negative for cough or wheezing Urologic: negative for hematuria Abdominal: negative for nausea, vomiting, diarrhea, bright red blood per rectum, melena, or hematemesis Neurologic: negative for visual changes, syncope, or dizziness All other systems reviewed and are otherwise negative except as noted above.    Blood pressure 132/70, pulse 82, height 5\' 7"  (1.702 m), weight 191 lb 9.6 oz (86.9 kg), SpO2 93 %.  General appearance: alert and no distress Neck: no adenopathy, no carotid bruit, no JVD, supple, symmetrical, trachea midline and thyroid not enlarged, symmetric, no tenderness/mass/nodules Lungs: clear to auscultation bilaterally Heart: regular rate and rhythm, S1, S2 normal, no murmur, click, rub or gallop Extremities: extremities normal, atraumatic, no cyanosis or edema Pulses: 2+ and symmetric Skin: Skin color, texture, turgor normal. No rashes or lesions Neurologic: Alert and oriented X 3, normal strength and tone. Normal symmetric reflexes. Normal coordination and gait  EKG not performed today  ASSESSMENT AND PLAN:   HTN (hypertension) History of essential hypertension blood pressure measured today 132/70.  He is on chlorthalidone, lisinopril and metoprolol.  Hyperlipemia History of hyperlipidemia on statin therapy with lipid profile performed 10/22/2019 revealing total cholesterol 105.  Pacemaker History of permanent transvenous pacemaker insertion for second-degree AV block by Dr. Sallyanne Kuster using a Medtronic Adapta device.  Dr. Sallyanne Kuster follows him on an annual basis.  Hx of CABG x 6 2008 ProceedHistory of CAD BG x6 April 20, 2007 by Dr.  Arrie Aran.  His last Myoview performed 12/21/2015 was low risk.  He does get occasional chest pain.  He has progressive dementia we decided not to perform further testing.      Lorretta Harp MD FACP,FACC,FAHA, [facility] 02/15/2020 4:28 PM

## 2020-02-15 NOTE — Assessment & Plan Note (Signed)
History of hyperlipidemia on statin therapy with lipid profile performed 10/22/2019 revealing total cholesterol 105.

## 2020-02-15 NOTE — Assessment & Plan Note (Signed)
ProceedHistory of CAD BG x6 April 20, 2007 by Dr. Arrie Aran.  His last Myoview performed 12/21/2015 was low risk.  He does get occasional chest pain.  He has progressive dementia we decided not to perform further testing.

## 2020-03-02 ENCOUNTER — Telehealth: Payer: Self-pay

## 2020-03-02 ENCOUNTER — Telehealth: Payer: Self-pay | Admitting: *Deleted

## 2020-03-02 NOTE — Telephone Encounter (Signed)
Patients wife called and asked if Dr. Tomi Likens can send a medication to the pharmacy to calm the patient down. I informed patients wife that Dr. Tomi Likens cannot send medication without accessing the patient, which is why he recommended patient be taken to the ER because he needs to be elevated. Patients wife stated that she can't take him to the ER because he is not capable to talking for himself and she cannot go in there with him, so she doesn't want to take him to the ER. I informed her that if patient is not competent they will allow her to accompany him. Patients wife is declining recommendations of taking patient to the emergency room. Informed patient that we will send Dr. Delice Lesch a message letting her know that patients wife is declining taking the patient to the ER and wants medication sent to calm him down. Informed her that Dr. Delice Lesch will be in in the morning and reminded her that the attending physician has recommended she take patient to the ER.

## 2020-03-02 NOTE — Telephone Encounter (Signed)
Spoke with patients wife and informed her that Dr Delice Lesch is out of the office. She wanted to know if Dr Delice Lesch has a physician covering for her patients. Informed her that Dr Tomi Likens, another physician in the office, was covering for Dr Delice Lesch. Gave wife Dr Georgie Chard recommendations, which was for the patient go to the hospital.   Wife refused multiple times stating the patient can not go to the ED by himself. She states te last time he went to ED they did not let her go back with the patient. I advised her that if the patient is not cognitive competent then one visitor is allowed, but we have no control over the hospital and its rules. She states she does not want to take the patient to the hospital.   She states she would like for Dr Delice Lesch to call her husband in a pill that will make this go away. Asked wife if she had reached out to pcp. She stated the old pcp retired and now they have a new pcp who does not know them. I advised wife to contact pcp for other options. She refused to contact pcp and states she will wait to hear back from Dr Delice Lesch. Wife was also frustrated stating" whats the point of having a neurologist if I can not reach them." I again reminded wife that Dr Delice Lesch was out of the office. She requested I call Dr Delice Lesch to ask her what to do about the patient and could I ask her to send in a medication because she does not know what to do with the patient. I again advised her that Dr Tomi Likens suggested the patient go to the emergency room. She refused and stated she was not taking the patient to the hospital because they are not letting people in the hospital.  Informed her that I would send a message to Dr Delice Lesch and if she had any other recommendations then we would give her a call back.

## 2020-03-02 NOTE — Telephone Encounter (Signed)
Patients wife called in and stated that patient is being very combative. I asked patients wife if everyone in the home is safe, including patient and she stated yes, that her son was there helping. Spoke to Dr. Tomi Likens and he recommended patient be taken to ER, because if this is a sudden onset of patient being combative, then he needs to be assessed. Patients wife also informed me that patient just got off of an Antibiotic to treat an UTI. I informed patient that she needs to call EMS and Police to help get patient to the ER. She verbalized understanding. Also, while speaking to patients wife I could hear patient and son in the background being loud and I asked patients wife if everything was ok and she said her son was trying to calm patient down.

## 2020-03-02 NOTE — Telephone Encounter (Signed)
Needs to go to ER.  The uti was from June in the chart, unless given another one. But concerned about sepsis if altered mental status or stroke.  Needs ER. Dr. Lovena Le

## 2020-03-02 NOTE — Telephone Encounter (Signed)
Patient wife aware that patient needs to go to the ER. She stated she was also told this by the neurology office when she contacted them. Says she doesn't have confidence in [facility] and doesn't know what to do. Let her know he needs to be seen if not [facility] then to take him to [place].

## 2020-03-03 MED ORDER — DIVALPROEX SODIUM 125 MG PO CSDR: DELAYED_RELEASE_CAPSULE | ORAL | 5 refills | Status: DC

## 2020-03-03 NOTE — Telephone Encounter (Signed)
Spoke to daughter Lawerance Bach. He is doing okay today, very combative yesterday, not following instructions, refusing medication. Swinging, bumping against wife. Son came and finally got him calmed down, then an hour after he started again.This is new behavior. Takes Lexapro in the evening. Asking if there is something to keep him more calm. Last dose of antibiotic for UTI yesterday, urine has cleared. Family does not feel he has any infection, in addition he is back to baseline this morning, ate his breakfast, took his medications. Discussed starting Depakote sprinkles for agitation. If he is having agitation daily, would give every evening. May also give prn, if they wife. Side effects discussed. Rx sent to pharmacy.

## 2020-03-05 ENCOUNTER — Other Ambulatory Visit: Payer: Self-pay | Admitting: Neurology

## 2020-03-08 ENCOUNTER — Ambulatory Visit (INDEPENDENT_AMBULATORY_CARE_PROVIDER_SITE_OTHER): Payer: Medicare Other | Admitting: *Deleted

## 2020-03-08 DIAGNOSIS — I441 Atrioventricular block, second degree: Secondary | ICD-10-CM | POA: Diagnosis not present

## 2020-03-08 LAB — CUP PACEART REMOTE DEVICE CHECK
Battery Impedance: 625 Ohm
Battery Remaining Longevity: 97 mo
Battery Voltage: 2.78 V
Brady Statistic AP VP Percent: 0 %
Brady Statistic AP VS Percent: 36 %
Brady Statistic AS VP Percent: 0 %
Brady Statistic AS VS Percent: 64 %
Date Time Interrogation Session: 20210901085729
Implantable Lead Implant Date: 20130605
Implantable Lead Implant Date: 20130605
Implantable Lead Location: 753859
Implantable Lead Location: 753860
Implantable Lead Model: 5076
Implantable Lead Model: 5076
Implantable Pulse Generator Implant Date: 20130605
Lead Channel Impedance Value: 455 Ohm
Lead Channel Impedance Value: 917 Ohm
Lead Channel Pacing Threshold Amplitude: 0.5 V
Lead Channel Pacing Threshold Amplitude: 0.625 V
Lead Channel Pacing Threshold Pulse Width: 0.4 ms
Lead Channel Pacing Threshold Pulse Width: 0.4 ms
Lead Channel Setting Pacing Amplitude: 1.5 V
Lead Channel Setting Pacing Amplitude: 2 V
Lead Channel Setting Pacing Pulse Width: 0.4 ms
Lead Channel Setting Sensing Sensitivity: 4 mV

## 2020-03-09 ENCOUNTER — Telehealth: Payer: Self-pay | Admitting: Neurology

## 2020-03-09 NOTE — Telephone Encounter (Signed)
Called and spoke to patients daughter Lawerance Bach. Informed her of Dr. Amparo Bristol recommendation of stopping the Depakote and increasing patients Lexapro. Patients daughter stated they will do that. Patients daughter also wanted to inform Dr. Delice Lesch that they're noticing patient is most agitated when he is using the bathroom or taking a shower. He requires assistance doing both and becomes agitated when helped or told it's time to shower. Patients daughter is hoping the medication increase will help and will call if any issues.

## 2020-03-09 NOTE — Progress Notes (Signed)
Remote pacemaker transmission.   

## 2020-03-09 NOTE — Telephone Encounter (Signed)
Patient's daughter Chad Martinez called in. She stated since his medication has been updated, the patient's has been much more combative and seeing things that are not there. The patient's wife has become "a bit" afraid of him.

## 2020-03-09 NOTE — Telephone Encounter (Signed)
Pls have them stop the Depakote and try increasing the Lexapro 20mg : Take 1 and 1/2 tablets daily instead and see if this helps better with agitation. Thanks

## 2020-03-23 DIAGNOSIS — R52 Pain, unspecified: Secondary | ICD-10-CM | POA: Diagnosis not present

## 2020-03-23 DIAGNOSIS — R410 Disorientation, unspecified: Secondary | ICD-10-CM | POA: Diagnosis not present

## 2020-03-23 DIAGNOSIS — W19XXXA Unspecified fall, initial encounter: Secondary | ICD-10-CM | POA: Diagnosis not present

## 2020-03-24 ENCOUNTER — Ambulatory Visit (INDEPENDENT_AMBULATORY_CARE_PROVIDER_SITE_OTHER): Payer: Medicare Other | Admitting: Neurology

## 2020-03-24 ENCOUNTER — Encounter: Payer: Self-pay | Admitting: Neurology

## 2020-03-24 ENCOUNTER — Other Ambulatory Visit: Payer: Self-pay

## 2020-03-24 VITALS — BP 103/62 | HR 71 | Ht 67.0 in | Wt 190.0 lb

## 2020-03-24 DIAGNOSIS — F0391 Unspecified dementia with behavioral disturbance: Secondary | ICD-10-CM | POA: Diagnosis not present

## 2020-03-24 DIAGNOSIS — F03B18 Unspecified dementia, moderate, with other behavioral disturbance: Secondary | ICD-10-CM

## 2020-03-24 MED ORDER — QUETIAPINE FUMARATE 25 MG PO TABS
ORAL_TABLET | ORAL | 11 refills | Status: DC
Start: 2020-03-24 — End: 2020-08-09

## 2020-03-24 MED ORDER — ESCITALOPRAM OXALATE 20 MG PO TABS: ORAL_TABLET | ORAL | 3 refills | Status: AC

## 2020-03-24 NOTE — Patient Instructions (Signed)
1. Start weaning off Namenda to 1 tablet daily for a week, then stop  2. Start Seroquel 25mg : Take 1/2 tablet every night for 1 week, then increase to 1 tablet every night  3. Take Lexapro 20mg : take 1 tablet in AM, 1/2 tablet in everning  4. Continue 24/7 care, agree with looking into Memory Care  5. Follow-up in 6 months, call for any changes

## 2020-03-24 NOTE — Progress Notes (Signed)
NEUROLOGY FOLLOW UP OFFICE NOTE  RICARDO SCHUBACH [id number] Aug 02, 1934  HISTORY OF PRESENT ILLNESS: I had the pleasure of seeing Chad Martinez in follow-up in the neurology clinic on 03/27/2020.  The patient was last seen 7 months ago for moderate dementia with behavioral disturbance, likely Lewy body dementia. He is again accompanied by his daughter Cassandria Santee today, his wife is on speakerphone.  Records and images were personally reviewed where available.  His wife contacted our office in May that he had another spell where he was "stiff as a board," BP was 190/102, he was brought to the ER and diagnosed with urinary retention. Rivastigmine was discontinued. In May, he had another episode while sitting at the kitchen table, he just dropped his coffee cup and fell asleep. They could not arouse him but he was talking to an audience telling everybody goodbye and asking forgiveness for anything, this lasted 45 minutes. His wife reported he was shaking, then calm. His son came and moved him to a reclining position, then he woke up and was very weak. He has become more confused, he tried to go outside and his wife tried to stop him. He did not hit her but would grab her very strongly. He has done the same thing with his aides, usually when helping him bathe or to the toilet. Low dose Depakote was started, however they felt he was more agitated with more hallucinations. This was stopped, Lexapro increased to 30mg  daily. Yesterday he went to the back door and pushed his wife aside, he went down the ramp without a walker. There are times he does not want to be touched, saying it hurts. His daughter reports he still has visual hallucinations but not like before. For the past month, nothing he says makes sense. He reports intermittent numbness in both feet. When asked about sleep, answer is tangential, "one fender to the other fender." Tabatha notes sleep and appetite are okay.    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 He feels his memory has been getting worse and going downhill over the past year. Family started noticing changes around 1-1.5 years ago, he was initially leaving doors and drawers open, or leave the water running. Now he forgets to flush the toilet. His daughter first noticed that he was more quiet. He has always been very jovial and talks to everybody, but now he does not talk as much. He stopped driving in April 8850 after he went the wrong way and got confused, they attribute this to his vision. His wife has always been in charge of finances and medications, but now has to remind him to take his medications because he would get confused about AM/PM medications. He likes to play Bingo but recently has had difficulty. His daughter also noticed that he would have slower processing/understanding when she talks to him. He follows instructions fine. There have been other behavioral changes as well, he is more anxious, pacing around the house. One time he got up at 2AM and showered, then the next morning recalled he took a shower and did not know why he did it. Over the past month he has had visual hallucinations where he would see people in the woods behind their house. No auditory component. He is able to dress and bathe independently. He had an MMSE at PCP office  in March 2019 which was 22/30 (24/30 in the summer).  His wife has noticed the past week that he seems to be slurring his words, his daughter describes it as difficulty finding the right word. He had urinary issues last July with urinary retention requiring self-catheterization, this has improved but since March he has had urinary incontinence and wears pull ups at night and when going out. He denies any headaches, dizziness, diplopia, dysphagia, neck/back pain, focal  numbness/tingling/weakness, bowel dysfunction, anosmia, or tremors. He fell from their house 5 years ago and was found to have AV block with pacemaker placed. No family history of dementia. No history of significant head injuries or alcohol intake.   I personally reviewed head CT without contrast done 10/07/17 which did not show any acute changes, there was mild diffuse atrophy and chronic microvascular disease.    PAST MEDICAL HISTORY: Past Medical History:  Diagnosis Date  . Colon polyps   . Coronary artery disease    s/p CABG x 6  . Detached retina    Partial  . Elevated PSA   . Enlarged prostate   . Hyperlipidemia   . Hypertension   . Pacemaker   . Pneumothorax 12/10/2011   right rib fx after fall from ladder  . Reflux   . Syncope    Permanent Pacemaker 12/11/2011 MDT Adapta    MEDICATIONS: Current Outpatient Medications on File Prior to Visit  Medication Sig Dispense Refill  . aspirin EC 81 MG tablet Take 81 mg by mouth at bedtime.     . chlorthalidone (HYGROTON) 25 MG tablet Take 1 tablet by mouth once daily 90 tablet 0  . escitalopram (LEXAPRO) 20 MG tablet Take 1 tablet by mouth once daily 90 tablet 0  . finasteride (PROSCAR) 5 MG tablet Take 5 mg by mouth in the morning.     Marland Kitchen lisinopril (ZESTRIL) 20 MG tablet Take 1 tablet by mouth once daily 90 tablet 0  . memantine (NAMENDA) 10 MG tablet Take 1 tablet (10 mg total) by mouth 2 (two) times daily. 180 tablet 3  . metoprolol tartrate (LOPRESSOR) 25 MG tablet Take 1 tablet (25 mg total) by mouth 2 (two) times daily. 180 tablet 1  . nitroGLYCERIN (NITROSTAT) 0.4 MG SL tablet Place 1 tablet (0.4 mg total) under the tongue every 5 (five) minutes as needed for chest pain (x 3 doses). 25 tablet 3  . simvastatin (ZOCOR) 80 MG tablet Take 1 tablet (80 mg total) by mouth at bedtime. 90 tablet 1  . tamsulosin (FLOMAX) 0.4 MG CAPS capsule Take 1 capsule (0.4 mg total) by mouth daily. (Patient taking differently: Take 0.4 mg by mouth in  the morning. ) 30 capsule 0  . divalproex (DEPAKOTE SPRINKLE) 125 MG capsule Give twice a day as needed for agitation 60 capsule 5   No current facility-administered medications on file prior to visit.    ALLERGIES: Allergies  Allergen Reactions  . Bactrim [Sulfamethoxazole-Trimethoprim] Rash    FAMILY HISTORY: Family History  Problem Relation Age of Onset  . Pneumonia Mother   . Multiple sclerosis Father   . Lung cancer Brother     SOCIAL HISTORY: Social History   Socioeconomic History  . Marital status: Married    Spouse name: Not on file  . Number of children: 4  . Years of education: 56  . Highest education level: Some college, no degree  Occupational History  . Occupation: retired  Tobacco Use  . Smoking status: Former Smoker  Packs/day: 3.00    Years: 20.00    Pack years: 60.00    Quit date: 12/19/1974    Years since quitting: 45.2  . Smokeless tobacco: Never Used  Vaping Use  . Vaping Use: Never used  Substance and Sexual Activity  . Alcohol use: No  . Drug use: No  . Sexual activity: Not Currently  Other Topics Concern  . Not on file  Social History Narrative   Right handed    Lives in a one story home    Social Determinants of Health   Financial Resource Strain:   . Difficulty of Paying Living Expenses: Not on file  Food Insecurity:   . Worried About Charity fundraiser in the Last Year: Not on file  . Ran Out of Food in the Last Year: Not on file  Transportation Needs:   . Lack of Transportation (Medical): Not on file  . Lack of Transportation (Non-Medical): Not on file  Physical Activity:   . Days of Exercise per Week: Not on file  . Minutes of Exercise per Session: Not on file  Stress:   . Feeling of Stress : Not on file  Social Connections:   . Frequency of Communication with Friends and Family: Not on file  . Frequency of Social Gatherings with Friends and Family: Not on file  . Attends Religious Services: Not on file  . Active  Member of Clubs or Organizations: Not on file  . Attends Archivist Meetings: Not on file  . Marital Status: Not on file  Intimate Partner Violence:   . Fear of Current or Ex-Partner: Not on file  . Emotionally Abused: Not on file  . Physically Abused: Not on file  . Sexually Abused: Not on file     PHYSICAL EXAM: Vitals:   03/24/20 1518  BP: 103/62  Pulse: 71  SpO2: 97%   General: No acute distress, sitting on wheelchair Head:  Normocephalic/atraumatic Skin/Extremities: No rash, no edema Neurological Exam: Patient is awake, alert, oriented to person. No aphasia or dysarthria. Reduced fluency,able to follow commands. Remote and recent memory impaired, 0/3 delayed recall. Unable to draw clock, drew circle then wrote "clock" under. MMSE - Mini Mental State Exam 03/27/2020 12/31/2018 05/07/2018  Orientation to time 0 0 5  Orientation to Place 0 5 5  Registration 3 3 3   Attention/ Calculation 0 1 1  Recall 1 0 2  Language- name 2 objects 2 2 2   Language- repeat 0 1 1  Language- follow 3 step command 2 2 3   Language- read & follow direction 1 1 1   Write a sentence 1 1 1   Copy design 0 0 1  Total score 10 16 25     Cranial nerves: Extraocular movements intact with no nystagmus. No facial asymmetry. Motor: no cogwheeling, moves all extremities symmetrically, at least anti-gravity x 4. No incoordination on finger to nose testing. Gait: slow and cautious with walker.No tremors.   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 He has hallucinations and recurrent episodes where family is unable to arouse him (fluctuating cognition/arousal). EEG normal. Family is not sure Memantine is helpful, we discussed weaning off. Continue Lexapro 30mg  daily. Family is having more difficulties managing him at home with  more behavioral changes. We did talk atypical antipsychotic medications  and increase risk of mortality in the elderly, usually because of infectious or  cardiac related etiologies.  We discussed cardiac black box warning in detail. Family believes that QOL is important and they have tried nonpharmacologic therapies without success. We discussed looking into higher level of care (Memory Care), resources provided, in the meantime as they continue to caregive in the home but worry that pt is a threat to self/others, family decided to try Seroquel. Start Seroquel 25mg  1/2 tab qhs for a week, then increase to 1 tab qhs. Follow-up in 6 months, they know to call for any changes.   Thank you for allowing me to participate in his care.  Please do not hesitate to call for any questions or concerns.   Ellouise Newer, M.D.   CC: Dr. Lovena Le

## 2020-03-26 ENCOUNTER — Other Ambulatory Visit: Payer: Self-pay | Admitting: Cardiovascular Disease

## 2020-03-27 ENCOUNTER — Encounter: Payer: Self-pay | Admitting: Cardiovascular Disease

## 2020-03-27 ENCOUNTER — Other Ambulatory Visit: Payer: Self-pay

## 2020-03-27 ENCOUNTER — Ambulatory Visit (INDEPENDENT_AMBULATORY_CARE_PROVIDER_SITE_OTHER): Payer: Medicare Other | Admitting: Cardiovascular Disease

## 2020-03-27 VITALS — BP 112/52 | HR 60 | Ht 67.0 in | Wt 190.0 lb

## 2020-03-27 DIAGNOSIS — E782 Mixed hyperlipidemia: Secondary | ICD-10-CM | POA: Diagnosis not present

## 2020-03-27 DIAGNOSIS — Z950 Presence of cardiac pacemaker: Secondary | ICD-10-CM | POA: Diagnosis not present

## 2020-03-27 DIAGNOSIS — I2510 Atherosclerotic heart disease of native coronary artery without angina pectoris: Secondary | ICD-10-CM

## 2020-03-27 DIAGNOSIS — F0391 Unspecified dementia with behavioral disturbance: Secondary | ICD-10-CM

## 2020-03-27 DIAGNOSIS — I10 Essential (primary) hypertension: Secondary | ICD-10-CM | POA: Diagnosis not present

## 2020-03-27 DIAGNOSIS — I441 Atrioventricular block, second degree: Secondary | ICD-10-CM

## 2020-03-27 DIAGNOSIS — E119 Type 2 diabetes mellitus without complications: Secondary | ICD-10-CM

## 2020-03-27 NOTE — Patient Instructions (Signed)

## 2020-03-27 NOTE — Progress Notes (Signed)
Cardiology Office Note    Date:  03/28/2020   ID:  Chad Martinez, DOB September 04, 1934, MRN [id number]  PCP:  Erven Colla, DO  Cardiologist:  Quay Burow, M.D.; Sanda Klein, MD   Chief Complaint  Patient presents with  . Pacemaker Check    History of Present Illness:  Chad Martinez is a 85 y.o. 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 He sees Dr. Gwenlyn Found for coronary artery disease with history of 6 vessel bypass surgery in 2008. He has normal left ventricular systolic function and had a normal nuclear study in 2017. He has treated hypertension and hyperlipidemia.  Since I last saw him he has not had any new cardiovascular problems, but his dementia continues to progress.  He has increasing episodes of sundowning, sometimes becoming combative and aggressive.  His Depakote has just been replaced with Seroquel.  He has had a few more episodes of decreased responsiveness, without actual loss of consciousness.  The patient (and corroborated by his wife) deny any chest pain at rest exertion, dyspnea at rest or with exertion, orthopnea, paroxysmal nocturnal dyspnea, syncope, palpitations, focal neurological deficits, intermittent claudication, lower extremity edema, unexplained weight gain, cough, hemoptysis or wheezing.  Interrogation of his pacemaker shows normal device function with an estimated longevity of another 8 years (Medtronic Adapta implanted 2013).  He has 36% atrial pacing and only 0.1% ventricular pacing.  There have been 3 episodes of atrial mode switch all under 1 minute in duration and there have not been any episodes of high ventricular rate  Past Medical History:  Diagnosis Date  . Colon polyps   . Coronary artery disease    s/p CABG x 6  . Detached retina    Partial  . Elevated PSA   . Enlarged prostate   . Hyperlipidemia   . Hypertension   . Pacemaker   . Pneumothorax 12/10/2011    right rib fx after fall from ladder  . Reflux   . Syncope    Permanent Pacemaker 12/11/2011 MDT Adapta    Past Surgical History:  Procedure Laterality Date  . BYPASS GRAFT  10/08   LIMA to LAD,SVG to 2nd & 3rd obtuse marginal CX,SVG to PDA,1st posterolateral and 2nd posterolateral RCA  . CARDIAC SURGERY    . COLONOSCOPY N/A 01/25/2013   Procedure: COLONOSCOPY;  Surgeon: Daneil Dolin, MD;  Location: [facility];  Service: Endoscopy;  Laterality: N/A;  8:30 Am  . CORONARY ARTERY BYPASS GRAFT  04/19/2012  . EYE SURGERY  2004  . HEMORROIDECTOMY    . NM MYOVIEW LTD  11/05/2010   no ischemia  . PERMANENT PACEMAKER INSERTION  12/11/2011   MDT Adapta  . PERMANENT PACEMAKER INSERTION N/A 12/11/2011   Procedure: PERMANENT PACEMAKER INSERTION;  Surgeon: Sanda Klein, MD;  Location: [facility];  Service: Cardiovascular;  Laterality: N/A;  . SKIN GRAFT  2012  . US ECHOCARDIOGRAPHY  10/14/2007   mild mitral annular ca+,EF 55%,AOV mildly sclerotic    Current Medications: Outpatient Medications Prior to Visit  Medication Sig Dispense Refill  . aspirin EC 81 MG tablet Take 81 mg by mouth at bedtime.     Marland Kitchen escitalopram (LEXAPRO) 20 MG tablet Take 1 tablet in AM, 1/2 tablet at night 135 tablet 3  . finasteride (PROSCAR) 5 MG tablet Take 5 mg by mouth in the morning.     . metoprolol tartrate (LOPRESSOR) 25 MG tablet Take 1  tablet (25 mg total) by mouth 2 (two) times daily. 180 tablet 1  . nitroGLYCERIN (NITROSTAT) 0.4 MG SL tablet Place 1 tablet (0.4 mg total) under the tongue every 5 (five) minutes as needed for chest pain (x 3 doses). 25 tablet 3  . QUEtiapine (SEROQUEL) 25 MG tablet Take 1/2 tablet every night for 1 week, then increase to 1 tablet daily 30 tablet 11  . simvastatin (ZOCOR) 80 MG tablet Take 1 tablet (80 mg total) by mouth at bedtime. 90 tablet 1  . tamsulosin (FLOMAX) 0.4 MG CAPS capsule Take 1 capsule (0.4 mg total) by mouth daily. (Patient taking differently: Take 0.4 mg by  mouth in the morning. ) 30 capsule 0  . chlorthalidone (HYGROTON) 25 MG tablet Take 1 tablet by mouth once daily 90 tablet 0  . lisinopril (ZESTRIL) 20 MG tablet Take 1 tablet by mouth once daily 90 tablet 0  . divalproex (DEPAKOTE SPRINKLE) 125 MG capsule Give twice a day as needed for agitation 60 capsule 5   No facility-administered medications prior to visit.     Allergies:   Bactrim [sulfamethoxazole-trimethoprim]   Social History   Socioeconomic History  . Marital status: Married    Spouse name: Not on file  . Number of children: 4  . Years of education: 15  . Highest education level: Some college, no degree  Occupational History  . Occupation: retired  Tobacco Use  . Smoking status: Former Smoker    Packs/day: 3.00    Years: 20.00    Pack years: 60.00    Quit date: 12/19/1974    Years since quitting: 45.3  . Smokeless tobacco: Never Used  Vaping Use  . Vaping Use: Never used  Substance and Sexual Activity  . Alcohol use: No  . Drug use: No  . Sexual activity: Not Currently  Other Topics Concern  . Not on file  Social History Narrative   Right handed    Lives in a one story home    Social Determinants of Health   Financial Resource Strain:   . Difficulty of Paying Living Expenses: Not on file  Food Insecurity:   . Worried About Charity fundraiser in the Last Year: Not on file  . Ran Out of Food in the Last Year: Not on file  Transportation Needs:   . Lack of Transportation (Medical): Not on file  . Lack of Transportation (Non-Medical): Not on file  Physical Activity:   . Days of Exercise per Week: Not on file  . Minutes of Exercise per Session: Not on file  Stress:   . Feeling of Stress : Not on file  Social Connections:   . Frequency of Communication with Friends and Family: Not on file  . Frequency of Social Gatherings with Friends and Family: Not on file  . Attends Religious Services: Not on file  . Active Member of Clubs or Organizations: Not on  file  . Attends Archivist Meetings: Not on file  . Marital Status: Not on file     Family History:  The patient's family history includes Lung cancer in his brother; Multiple sclerosis in his father; Pneumonia in his mother.   ROS:   Please see the history of present illness.    ROS  All other systems are reviewed and are negative.  PHYSICAL EXAM:   VS:  BP (!) 112/52   Pulse 60   Ht 5\' 7"  (1.702 m)   Wt 190 lb (86.2 kg)  SpO2 95%   BMI 29.76 kg/m      General: Alert, oriented x3, no distress, sitting on his rolling walker.  Healthy left subclavian pacemaker site. Head: no evidence of trauma, PERRL, EOMI, no exophtalmos or lid lag, no myxedema, no xanthelasma; normal ears, nose and oropharynx Neck: normal jugular venous pulsations and no hepatojugular reflux; brisk carotid pulses without delay and no carotid bruits Chest: clear to auscultation, no signs of consolidation by percussion or palpation, normal fremitus, symmetrical and full respiratory excursions Cardiovascular: normal position and quality of the apical impulse, regular rhythm, normal first and widely split second heart sounds, no murmurs, rubs or gallops Abdomen: no tenderness or distention, no masses by palpation, no abnormal pulsatility or arterial bruits, normal bowel sounds, no hepatosplenomegaly Extremities: no clubbing, cyanosis or edema; 2+ radial, ulnar and brachial pulses bilaterally; 2+ right femoral, posterior tibial and dorsalis pedis pulses; 2+ left femoral, posterior tibial and dorsalis pedis pulses; no subclavian or femoral bruits Neurological: grossly nonfocal Psych: Normal mood and affect   Wt Readings from Last 3 Encounters:  03/27/20 190 lb (86.2 kg)  03/24/20 190 lb (86.2 kg)  02/15/20 191 lb 9.6 oz (86.9 kg)      Studies/Labs Reviewed:   EKG:  EKG is not ordered today.  The electrocardiogram from March 08, 2019 shows atrial paced, ventricular sensed rhythm with a pre-existing  right bundle branch block, no ischemic changes Recent Labs: 11/28/2019: ALT 23; BUN 20; Creatinine, Ser 1.04; Hemoglobin 13.4; Platelets 149; Potassium 3.5; Sodium 135   Lipid Panel    Component Value Date/Time   CHOL 105 10/22/2019 1505   TRIG 272 (H) 10/22/2019 1505   HDL 29 (L) 10/22/2019 1505   CHOLHDL 3.6 10/22/2019 1505   CHOLHDL 2.8 01/31/2014 0714   VLDL 24 01/31/2014 0714   LDLCALC 35 10/22/2019 1505    ASSESSMENT:    1. Mobitz type 2 second degree AV block   2. Pacemaker   3. Coronary artery disease involving native coronary artery of native heart without angina pectoris   4. Essential hypertension   5. Mixed hyperlipidemia   6. Type 2 diabetes mellitus without complication, without long-term current use of insulin (HCC)   7. Dementia with behavioral disturbance, unspecified dementia type ([place])      PLAN:  In order of problems listed above:  1. Second degree AV block: rarely requires V pacing. 2. PPM: Normal function.  Remote downloads every 3 months.  Manual testing of lead parameters was performed today with excellent pacing threshold on both the atrial and ventricular lead. 3. CAD s/p CABG: Asymptomatic 4. HTN: well controlled. No dizziness or syncope. BP documented as normal during his spells of reduced interaction. 5. HLP: On statin, excellent LDL 6. DM: A1c has increased, but <8% may be acceptable considering comorbid conditions and age 69. Dementia: probably his most serious problem at this point. May be reaching the limits of his wife's ability to care for him..    Medication Adjustments/Labs and Tests Ordered: Current medicines are reviewed at length with the patient today.  Concerns regarding medicines are outlined above.  Medication changes, Labs and Tests ordered today are listed in the Patient Instructions below. Patient Instructions  Medication Instructions:  No changes *If you need a refill on your cardiac medications before your next appointment,  please call your pharmacy*   Lab Work: None ordered If you have labs (blood work) drawn today and your tests are completely normal, you will receive your results only by: Marland Kitchen  MyChart Message (if you have MyChart) OR . A paper copy in the mail If you have any lab test that is abnormal or we need to change your treatment, we will call you to review the results.   Testing/Procedures: None ordered   Follow-Up: At [facility], you and your health needs are our priority.  As part of our continuing mission to provide you with exceptional heart care, we have created designated Provider Care Teams.  These Care Teams include your primary Cardiologist (physician) and Advanced Practice Providers (APPs -  Physician Assistants and Nurse Practitioners) who all work together to provide you with the care you need, when you need it.  We recommend signing up for the patient portal called "MyChart".  Sign up information is provided on this After Visit Summary.  MyChart is used to connect with patients for Virtual Visits (Telemedicine).  Patients are able to view lab/test results, encounter notes, upcoming appointments, etc.  Non-urgent messages can be sent to your provider as well.   To learn more about what you can do with MyChart, go to [email or web address].    Your next appointment:   12 month(s)  The format for your next appointment:   In Person  Provider:   Sanda Klein, MD     Signed, Sanda Klein, MD  03/28/2020 9:33 AM    [address], [place], [place]  [address] Phone: [phone]; Fax: [phone]

## 2020-03-30 ENCOUNTER — Ambulatory Visit (INDEPENDENT_AMBULATORY_CARE_PROVIDER_SITE_OTHER): Payer: Medicare Other | Admitting: Family Medicine

## 2020-03-30 ENCOUNTER — Other Ambulatory Visit: Payer: Self-pay

## 2020-03-30 ENCOUNTER — Encounter: Payer: Self-pay | Admitting: Family Medicine

## 2020-03-30 VITALS — BP 128/68 | HR 76 | Temp 97.1°F | Ht 67.0 in | Wt 190.0 lb

## 2020-03-30 DIAGNOSIS — Z111 Encounter for screening for respiratory tuberculosis: Secondary | ICD-10-CM | POA: Diagnosis not present

## 2020-03-30 DIAGNOSIS — I10 Essential (primary) hypertension: Secondary | ICD-10-CM | POA: Diagnosis not present

## 2020-03-30 DIAGNOSIS — R972 Elevated prostate specific antigen [PSA]: Secondary | ICD-10-CM | POA: Diagnosis not present

## 2020-03-30 DIAGNOSIS — E782 Mixed hyperlipidemia: Secondary | ICD-10-CM | POA: Diagnosis not present

## 2020-03-30 DIAGNOSIS — F0391 Unspecified dementia with behavioral disturbance: Secondary | ICD-10-CM | POA: Diagnosis not present

## 2020-03-30 DIAGNOSIS — E119 Type 2 diabetes mellitus without complications: Secondary | ICD-10-CM

## 2020-03-30 NOTE — Progress Notes (Signed)
Patient ID: Chad Martinez, male    DOB: Jan 14, 1935, 85 y.o.   MRN: [id number]   Chief Complaint  Patient presents with  . Hypertension   Subjective:    HPI Pt here for follow up. Pt is needing a TB test done for the [facility] in [place]. Pt wife has a hard time with transportation so would like to go to the lab if possible for TB blood test. Pt is on his last week of Nam[place].   [facility], needing [facility] testing. For [facility].  Wife requesting psa. Seeing urology next month.  Last psa in 2016- 7.1.  Seeing urology for years.  Has h/o elevated psa. bph- on flomax and fineasteride.  Urination is better.  Cardiology gave her a DNR. Pt might be [facility] to the [facility]. Pt doesn't have papers yet.  Neurologist is recommending a facility and wife not sure. Daughter is making calls for this. Thinking [facility] in [place].  Wife requesting- Ref home health- dementia. Hard to do ADLS. Pt sitting in wheelchair.  Left out the back door and went down the ramp.  Trying to get into car. Combative and chased her in yard and fell in grass.  Called emt to get him up. Changed his meds and ws taking lexapro at night.  Switched 1 tab am and 1/2 at night. seroquel and at night. Not on namenda anymore. Has been more calm this week per wife. Tooth ache yesterday and pulled a tooth out and he's feeling better.   Need rf simv and metop. [facility]. Tb test today requested for the assisted living center.  Medical History Patricia has a past medical history of Colon polyps, Coronary artery disease, Detached retina, Elevated PSA, Enlarged prostate, Hyperlipidemia, Hypertension, Pacemaker, Pneumothorax (12/10/2011), Reflux, and Syncope.   Outpatient Encounter Medications as of 03/30/2020  Medication Sig  . aspirin EC 81 MG tablet Take 81 mg by mouth at bedtime.   . chlorthalidone (HYGROTON) 25 MG tablet Take 1 tablet by mouth once daily  . escitalopram (LEXAPRO) 20  MG tablet Take 1 tablet in AM, 1/2 tablet at night  . finasteride (PROSCAR) 5 MG tablet Take 5 mg by mouth in the morning.   Marland Kitchen lisinopril (ZESTRIL) 20 MG tablet Take 1 tablet by mouth once daily  . metoprolol tartrate (LOPRESSOR) 25 MG tablet Take 1 tablet (25 mg total) by mouth 2 (two) times daily.  . nitroGLYCERIN (NITROSTAT) 0.4 MG SL tablet Place 1 tablet (0.4 mg total) under the tongue every 5 (five) minutes as needed for chest pain (x 3 doses).  . QUEtiapine (SEROQUEL) 25 MG tablet Take 1/2 tablet every night for 1 week, then increase to 1 tablet daily  . simvastatin (ZOCOR) 80 MG tablet Take 1 tablet (80 mg total) by mouth at bedtime.  . tamsulosin (FLOMAX) 0.4 MG CAPS capsule Take 1 capsule (0.4 mg total) by mouth daily. (Patient taking differently: Take 0.4 mg by mouth in the morning. )  . metFORMIN (GLUCOPHAGE) 500 MG tablet Take 1 tablet (500 mg total) by mouth 2 (two) times daily with a meal.  . [DISCONTINUED] divalproex (DEPAKOTE SPRINKLE) 125 MG capsule Give twice a day as needed for agitation   No facility-administered encounter medications on file as of 03/30/2020.     Review of Systems  Constitutional: Negative for chills and fever.  HENT: Negative for congestion, rhinorrhea and sore throat.   Respiratory: Negative for cough, shortness of breath and wheezing.  Cardiovascular: Negative for chest pain and leg swelling.  Gastrointestinal: Negative for abdominal pain, diarrhea, nausea and vomiting.  Genitourinary: Negative for dysuria and frequency.  Skin: Negative for rash.  Neurological: Negative for dizziness, weakness and headaches.     Vitals BP 128/68   Pulse 76   Temp (!) 97.1 F (36.2 C)   Ht _0  (1.702 m)   Wt 190 lb (86.2 kg) Comment: per wife  SpO2 95%   BMI 29.76 kg/m   Objective:   Physical Exam Vitals and nursing note reviewed.  Constitutional:      General: He is not in acute distress.    Appearance: Normal appearance. He is not ill-appearing.   HENT:     Head: Normocephalic.     Nose: Nose normal. No congestion.     Mouth/Throat:     Mouth: Mucous membranes are moist.     Pharynx: No oropharyngeal exudate.  Eyes:     Extraocular Movements: Extraocular movements intact.     Conjunctiva/sclera: Conjunctivae normal.     Pupils: Pupils are equal, round, and reactive to light.  Cardiovascular:     Rate and Rhythm: Normal rate and regular rhythm.     Pulses: Normal pulses.     Heart sounds: Normal heart sounds. No murmur heard.   Pulmonary:     Effort: Pulmonary effort is normal.     Breath sounds: Normal breath sounds. No wheezing, rhonchi or rales.  Musculoskeletal:        General: Normal range of motion.     Right lower leg: No edema.     Left lower leg: No edema.  Skin:    General: Skin is warm and dry.     Findings: No rash.  Neurological:     General: No focal deficit present.     Mental Status: He is alert.     Cranial Nerves: No cranial nerve deficit.     Comments: +sitting in rolling walker. H/o dementia  Psychiatric:        Mood and Affect: Mood normal.        Behavior: Behavior normal.      Assessment and Plan   1. Dementia with behavioral disturbance, unspecified dementia type ([place])  2. Essential hypertension - CMP14+EGFR  3. Type 2 diabetes mellitus without complication, without long-term current use of insulin (HCC) - CMP14+EGFR - metFORMIN (GLUCOPHAGE) 500 MG tablet; Take 1 tablet (500 mg total) by mouth 2 (two) times daily with a meal.  Dispense: 180 tablet; Refill: 1  4. Mixed hyperlipidemia - Lipid panel  5. Screening-pulmonary TB - QuantiFERON-TB Gold Plus  6. Elevated prostate specific antigen (PSA) - PSA   Ordered home health to see if they can help with ADLs. Dementia- worsening, having increase in behaviors, wife needing more help with care and looking into assisted living.  Cont seroquel and lexapro.  Cont f/u with neuro.  DM2- elevated bg at 200.  Cont metformin, dec carb  intake.  CKD- elevated Cr at 1.41.  Cont to control bp.  HLD- elevated TGs and all others stable. Cont meds.  psa- normal.

## 2020-04-01 LAB — CMP14+EGFR
ALT: 18 IU/L (ref 0–44)
AST: 13 IU/L (ref 0–40)
Albumin/Globulin Ratio: 1.8 (ref 1.2–2.2)
Albumin: 4.2 g/dL (ref 3.6–4.6)
Alkaline Phosphatase: 78 IU/L (ref 44–121)
BUN/Creatinine Ratio: 20 (ref 10–24)
BUN: 28 mg/dL — ABNORMAL HIGH (ref 8–27)
Bilirubin Total: 0.8 mg/dL (ref 0.0–1.2)
CO2: 27 mmol/L (ref 20–29)
Calcium: 9.3 mg/dL (ref 8.6–10.2)
Chloride: 97 mmol/L (ref 96–106)
Creatinine, Ser: 1.41 mg/dL — ABNORMAL HIGH (ref 0.76–1.27)
GFR calc Af Amer: 53 mL/min/{1.73_m2} — ABNORMAL LOW (ref >59)
GFR calc non Af Amer: 46 mL/min/{1.73_m2} — ABNORMAL LOW (ref >59)
Globulin, Total: 2.4 g/dL (ref 1.5–4.5)
Glucose: 200 mg/dL — ABNORMAL HIGH (ref 65–99)
Potassium: 4.1 mmol/L (ref 3.5–5.2)
Sodium: 134 mmol/L (ref 134–144)
Total Protein: 6.6 g/dL (ref 6.0–8.5)

## 2020-04-01 LAB — QUANTIFERON-TB GOLD PLUS
QuantiFERON Mitogen Value: >10 IU/mL
QuantiFERON Nil Value: 0.02 IU/mL
QuantiFERON TB1 Ag Value: 0.03 IU/mL
QuantiFERON TB2 Ag Value: 0.02 IU/mL
QuantiFERON-TB Gold Plus: NEGATIVE

## 2020-04-01 LAB — LIPID PANEL
Chol/HDL Ratio: 3.4 ratio (ref 0.0–5.0)
Cholesterol, Total: 101 mg/dL (ref 100–199)
HDL: 30 mg/dL — ABNORMAL LOW (ref >39)
LDL Chol Calc (NIH): 37 mg/dL (ref 0–99)
Triglycerides: 214 mg/dL — ABNORMAL HIGH (ref 0–149)
VLDL Cholesterol Cal: 34 mg/dL (ref 5–40)

## 2020-04-01 LAB — PSA: Prostate Specific Ag, Serum: 3.8 ng/mL (ref 0.0–4.0)

## 2020-04-01 IMAGING — CR DG CHEST 2V
2 series · 2 of 2 positions shown · non-contrast
Comparison: December 12, 2011

CLINICAL DATA: Altered mental status.

EXAM:
CHEST - 2 VIEW

[chest lat]
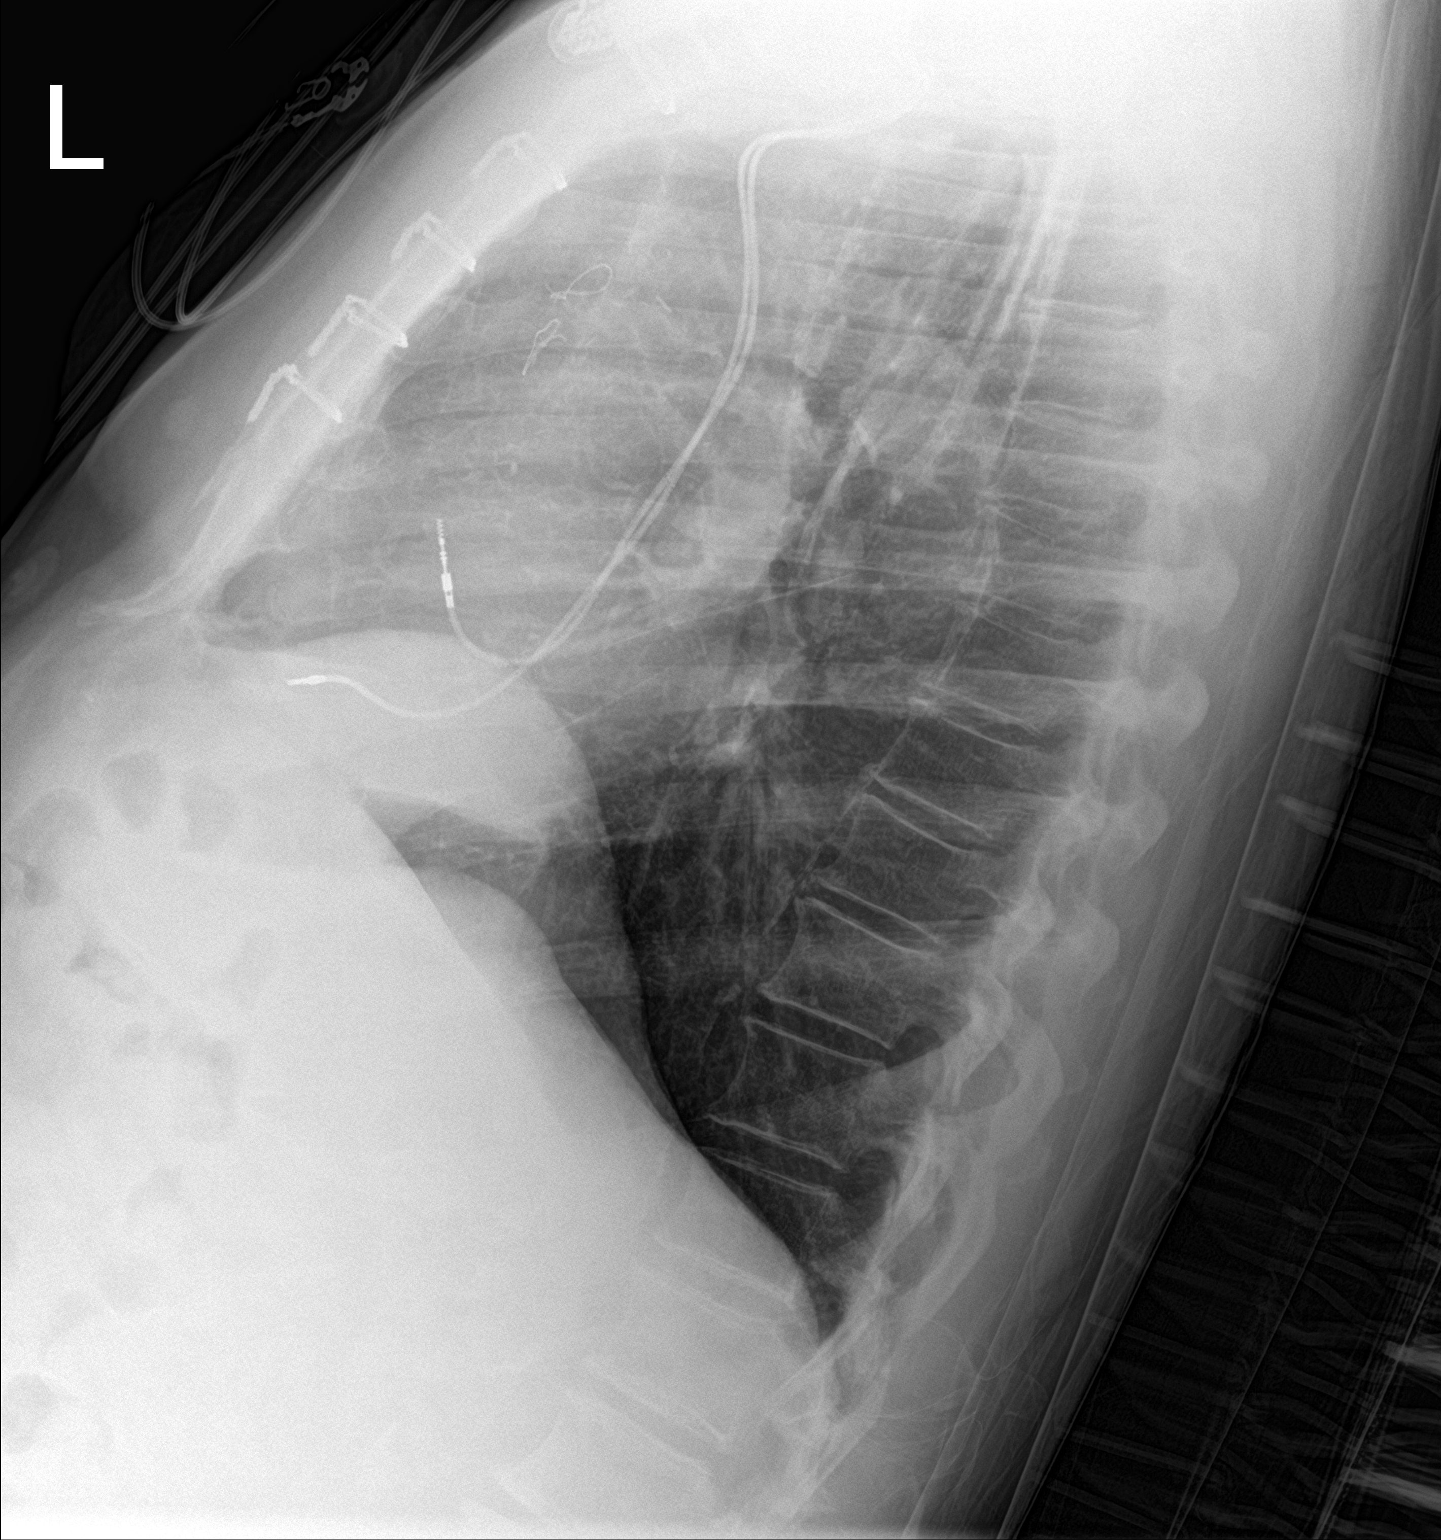

[chest ap]
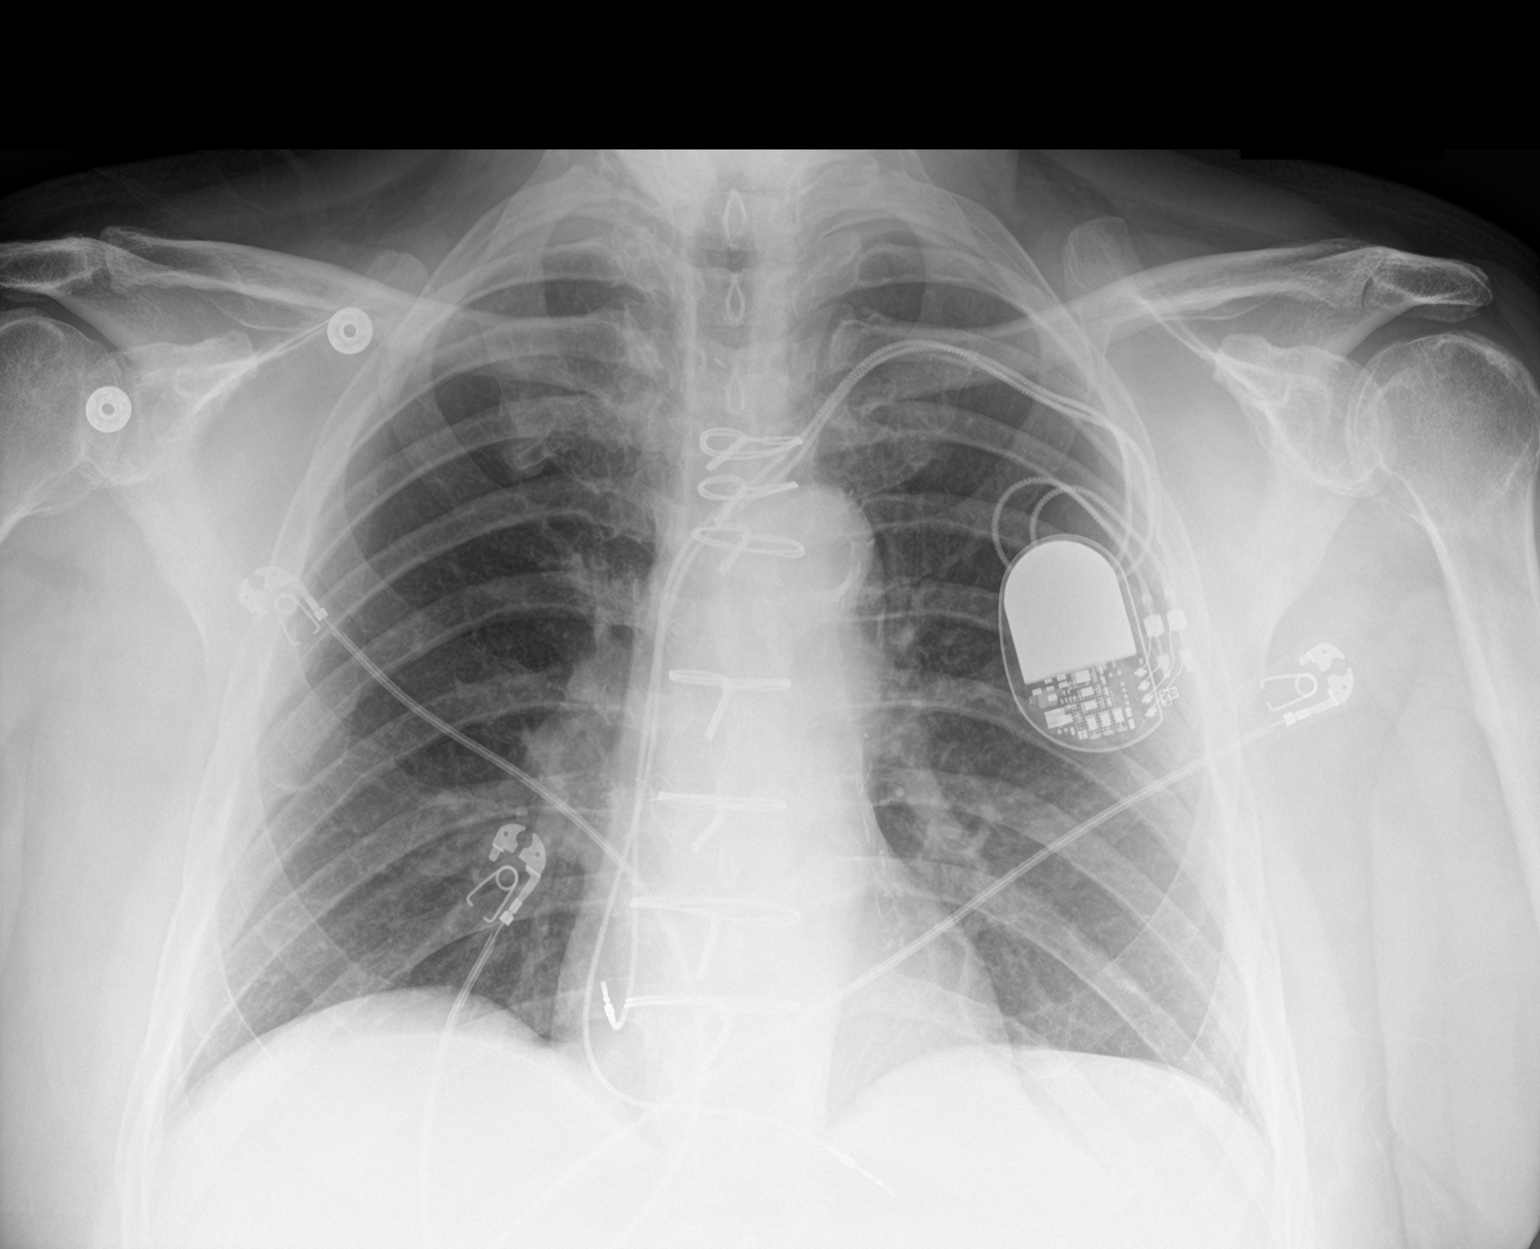

[2 of 2 positions shown; findings below may reference images not displayed]

FINDINGS: The patient is a multi lead left-sided pacemaker. The positioning
appears similar to prior study. There is no pneumothorax. The heart
size is stable from prior study. Aortic calcifications are noted.
The patient is status post prior median sternotomy.
IMPRESSION: No active cardiopulmonary disease.

## 2020-04-01 MED ORDER — METFORMIN HCL 500 MG PO TABS: 500.0000 mg | ORAL_TABLET | Freq: Two times a day (BID) | ORAL | 1 refills | Status: DC

## 2020-04-04 ENCOUNTER — Telehealth: Payer: Self-pay

## 2020-04-04 NOTE — Telephone Encounter (Signed)
Daughter needs a letter written that her dad is Incapacited. That he can't handle financial affairs  or capable of finances per Her Finances, etc. The daughter needs to be listed as the first fiancial person to take care of him. She is the [place] of attorney. Her name is Sharol Roussel, she is also Sending FL2 forms to be filled out.

## 2020-04-04 NOTE — Telephone Encounter (Signed)
Patient's daughter called in wanting to speak with someone about his diagnosis. They are trying to get him into a nursing home and she has a few questions.

## 2020-04-05 ENCOUNTER — Encounter: Payer: Self-pay | Admitting: Neurology

## 2020-04-05 NOTE — Telephone Encounter (Signed)
Spoke to pt daughter she will come by the office to pick up the letter that was requested

## 2020-04-05 NOTE — Telephone Encounter (Signed)
Done, thanks. I have not received FL2 paperwork yet.

## 2020-04-06 ENCOUNTER — Telehealth: Payer: Self-pay | Admitting: *Deleted

## 2020-04-06 ENCOUNTER — Telehealth: Payer: Self-pay | Admitting: Neurology

## 2020-04-06 NOTE — Telephone Encounter (Signed)
FL2 received by fax. I placed it in Dr. Amparo Bristol inbox.

## 2020-04-06 NOTE — Telephone Encounter (Signed)
error 

## 2020-04-06 NOTE — Telephone Encounter (Signed)
Notified daughter, fl2 isn't here.`

## 2020-04-06 NOTE — Telephone Encounter (Signed)
Pt daughter called and informed that FL2 papers faxed to [employer]

## 2020-04-06 NOTE — Telephone Encounter (Signed)
Done, thanks

## 2020-04-06 NOTE — Telephone Encounter (Signed)
Patient's daughter wants to know if the nursing home sent the [facility] form for the patient yet. She said they were supposed to send it yesterday.

## 2020-04-13 ENCOUNTER — Telehealth: Payer: Self-pay | Admitting: Neurology

## 2020-04-13 NOTE — Telephone Encounter (Signed)
Patient's daughter calling about FL-2 paperwork. [facility] patient. They are asking the paperwork be sent to [facility] fax # [phone].

## 2020-04-13 NOTE — Telephone Encounter (Signed)
Pt daughter called informed that FL2 paperwork was faxed

## 2020-04-19 DIAGNOSIS — I10 Essential (primary) hypertension: Secondary | ICD-10-CM | POA: Diagnosis not present

## 2020-04-19 DIAGNOSIS — M6281 Muscle weakness (generalized): Secondary | ICD-10-CM | POA: Diagnosis not present

## 2020-04-19 DIAGNOSIS — F0281 Dementia in other diseases classified elsewhere with behavioral disturbance: Secondary | ICD-10-CM | POA: Diagnosis not present

## 2020-04-19 DIAGNOSIS — E785 Hyperlipidemia, unspecified: Secondary | ICD-10-CM | POA: Diagnosis not present

## 2020-04-22 ENCOUNTER — Emergency Department (HOSPITAL_COMMUNITY): Payer: Medicare Other

## 2020-04-22 ENCOUNTER — Other Ambulatory Visit: Payer: Self-pay

## 2020-04-22 ENCOUNTER — Emergency Department (HOSPITAL_COMMUNITY)
Admission: EM | Admit: 2020-04-22 | Discharge: 2020-04-22 | Disposition: A | Discharge status: Home / Self Care | Payer: Medicare Other | Attending: Emergency Medicine | Admitting: Emergency Medicine

## 2020-04-22 DIAGNOSIS — Z951 Presence of aortocoronary bypass graft: Secondary | ICD-10-CM | POA: Diagnosis not present

## 2020-04-22 DIAGNOSIS — I10 Essential (primary) hypertension: Secondary | ICD-10-CM | POA: Insufficient documentation

## 2020-04-22 DIAGNOSIS — Y92129 Unspecified place in nursing home as the place of occurrence of the external cause: Secondary | ICD-10-CM | POA: Diagnosis not present

## 2020-04-22 DIAGNOSIS — W19XXXA Unspecified fall, initial encounter: Secondary | ICD-10-CM

## 2020-04-22 DIAGNOSIS — M4312 Spondylolisthesis, cervical region: Secondary | ICD-10-CM | POA: Diagnosis not present

## 2020-04-22 DIAGNOSIS — I6529 Occlusion and stenosis of unspecified carotid artery: Secondary | ICD-10-CM | POA: Diagnosis not present

## 2020-04-22 DIAGNOSIS — Z7982 Long term (current) use of aspirin: Secondary | ICD-10-CM | POA: Insufficient documentation

## 2020-04-22 DIAGNOSIS — I6782 Cerebral ischemia: Secondary | ICD-10-CM | POA: Diagnosis not present

## 2020-04-22 DIAGNOSIS — M546 Pain in thoracic spine: Secondary | ICD-10-CM | POA: Insufficient documentation

## 2020-04-22 DIAGNOSIS — I2510 Atherosclerotic heart disease of native coronary artery without angina pectoris: Secondary | ICD-10-CM | POA: Diagnosis not present

## 2020-04-22 DIAGNOSIS — S0990XA Unspecified injury of head, initial encounter: Secondary | ICD-10-CM | POA: Insufficient documentation

## 2020-04-22 DIAGNOSIS — J3489 Other specified disorders of nose and nasal sinuses: Secondary | ICD-10-CM | POA: Diagnosis not present

## 2020-04-22 DIAGNOSIS — J320 Chronic maxillary sinusitis: Secondary | ICD-10-CM | POA: Diagnosis not present

## 2020-04-22 DIAGNOSIS — Z7984 Long term (current) use of oral hypoglycemic drugs: Secondary | ICD-10-CM | POA: Diagnosis not present

## 2020-04-22 DIAGNOSIS — Z87891 Personal history of nicotine dependence: Secondary | ICD-10-CM | POA: Diagnosis not present

## 2020-04-22 DIAGNOSIS — Z950 Presence of cardiac pacemaker: Secondary | ICD-10-CM | POA: Diagnosis not present

## 2020-04-22 DIAGNOSIS — F0390 Unspecified dementia without behavioral disturbance: Secondary | ICD-10-CM | POA: Diagnosis not present

## 2020-04-22 DIAGNOSIS — Z79899 Other long term (current) drug therapy: Secondary | ICD-10-CM | POA: Insufficient documentation

## 2020-04-22 DIAGNOSIS — E119 Type 2 diabetes mellitus without complications: Secondary | ICD-10-CM | POA: Insufficient documentation

## 2020-04-22 DIAGNOSIS — W01198A Fall on same level from slipping, tripping and stumbling with subsequent striking against other object, initial encounter: Secondary | ICD-10-CM | POA: Diagnosis not present

## 2020-04-22 DIAGNOSIS — R52 Pain, unspecified: Secondary | ICD-10-CM

## 2020-04-22 DIAGNOSIS — I672 Cerebral atherosclerosis: Secondary | ICD-10-CM | POA: Diagnosis not present

## 2020-04-22 NOTE — Discharge Instructions (Signed)
If you develop continued, recurrent, or worsening headache, fever, neck stiffness, vomiting, blurry or double vision, weakness or numbness in your arms or legs, trouble speaking, or any other new/concerning symptoms then return to the ER for evaluation.  

## 2020-04-22 NOTE — ED Provider Notes (Signed)
[facility] EMERGENCY DEPARTMENT Provider Note   CSN: [id number] Arrival date & time: 04/22/20  1953  LEVEL 5 CAVEAT - DEMENTIA  History Chief Complaint  Patient presents with  . Fall    Chad Martinez is a 85 y.o. male.  HPI 85 year old male presents with a fall.  He came from [facility] where staff saw him get caught up on the carpet with his walker and he tripped and fell hitting his occiput on the corner of a dresser.  He is at his mental status baseline.  There was never any loss of consciousness.  He is not on blood thinners.  Patient states he is feeling fine.  Past Medical History:  Diagnosis Date  . Colon polyps   . Coronary artery disease    s/p CABG x 6  . Detached retina    Partial  . Elevated PSA   . Enlarged prostate   . Hyperlipidemia   . Hypertension   . Pacemaker   . Pneumothorax 12/10/2011   right rib fx after fall from ladder  . Reflux   . Syncope    Permanent Pacemaker 12/11/2011 MDT Adapta    Patient Active Problem List   Diagnosis Date Noted  . Gastroesophageal reflux disease without esophagitis 12/25/2019  . Early satiety 12/25/2019  . Mild dementia ([place]) 11/26/2017  . AKI (acute kidney injury) ([place]) 02/04/2017  . Impingement syndrome of left shoulder 10/10/2016  . Type 2 diabetes mellitus without complication, without long-term current use of insulin ([place]) 10/10/2016  . CAD (coronary artery disease) 03/06/2016  . Shoulder pain, left 01/10/2016  . Benign prostatic hyperplasia with urinary obstruction 03/20/2015  . Pacemaker 12/19/2012  . Mobitz type 2 second degree AV block 12/19/2012  . Hx of CABG x 6 2008 12/19/2012  . Hyperlipemia 10/22/2012  . Elevated prostate specific antigen (PSA) 10/22/2012  . HTN (hypertension) 12/11/2011  . Bradycardia 12/10/2011  . Ribs, multiple fractures, right, closed, initial encounter 12/10/2011  . Pneumothorax, right 12/10/2011  . Retinal detachment 10/11/2011    Past Surgical  History:  Procedure Laterality Date  . BYPASS GRAFT  10/08   LIMA to LAD,SVG to 2nd & 3rd obtuse marginal CX,SVG to PDA,1st posterolateral and 2nd posterolateral RCA  . CARDIAC SURGERY    . COLONOSCOPY N/A 01/25/2013   Procedure: COLONOSCOPY;  Surgeon: Daneil Dolin, MD;  Location: [facility]ITE;  Service: Endoscopy;  Laterality: N/A;  8:30 Am  . CORONARY ARTERY BYPASS GRAFT  04/19/2012  . EYE SURGERY  2004  . HEMORROIDECTOMY    . NM MYOVIEW LTD  11/05/2010   no ischemia  . PERMANENT PACEMAKER INSERTION  12/11/2011   MDT Adapta  . PERMANENT PACEMAKER INSERTION N/A 12/11/2011   Procedure: PERMANENT PACEMAKER INSERTION;  Surgeon: Sanda Klein, MD;  Location: [facility] CATH LAB;  Service: Cardiovascular;  Laterality: N/A;  . SKIN GRAFT  2012  . US ECHOCARDIOGRAPHY  10/14/2007   mild mitral annular ca+,EF 55%,AOV mildly sclerotic       Family History  Problem Relation Age of Onset  . Pneumonia Mother   . Multiple sclerosis Father   . Lung cancer Brother     Social History   Tobacco Use  . Smoking status: Former Smoker    Packs/day: 3.00    Years: 20.00    Pack years: 60.00    Quit date: 12/19/1974    Years since quitting: 45.3  . Smokeless tobacco: Never Used  Vaping Use  . Vaping Use: Never  used  Substance Use Topics  . Alcohol use: No  . Drug use: No    Home Medications Prior to Admission medications   Medication Sig Start Date End Date Taking? Authorizing Provider  aspirin EC 81 MG tablet Take 81 mg by mouth at bedtime.     [provider]  chlorthalidone (HYGROTON) 25 MG tablet Take 1 tablet by mouth once daily 03/27/20   Lorretta Harp, MD  escitalopram (LEXAPRO) 20 MG tablet Take 1 tablet in AM, 1/2 tablet at night 03/24/20   Cameron Sprang, MD  finasteride (PROSCAR) 5 MG tablet Take 5 mg by mouth in the morning.  04/21/17   [provider]  lisinopril (ZESTRIL) 20 MG tablet Take 1 tablet by mouth once daily 03/27/20   Lorretta Harp, MD  metFORMIN  (GLUCOPHAGE) 500 MG tablet Take 1 tablet (500 mg total) by mouth 2 (two) times daily with a meal. 04/01/20   Lovena Le, Malena M, DO  metoprolol tartrate (LOPRESSOR) 25 MG tablet Take 1 tablet (25 mg total) by mouth 2 (two) times daily. 10/22/19   Mikey Kirschner, MD  nitroGLYCERIN (NITROSTAT) 0.4 MG SL tablet Place 1 tablet (0.4 mg total) under the tongue every 5 (five) minutes as needed for chest pain (x 3 doses). 11/12/18   Croitoru, Mihai, MD  QUEtiapine (SEROQUEL) 25 MG tablet Take 1/2 tablet every night for 1 week, then increase to 1 tablet daily 03/24/20   Cameron Sprang, MD  simvastatin (ZOCOR) 80 MG tablet Take 1 tablet (80 mg total) by mouth at bedtime. 10/22/19   Mikey Kirschner, MD  tamsulosin (FLOMAX) 0.4 MG CAPS capsule Take 1 capsule (0.4 mg total) by mouth daily. Patient taking differently: Take 0.4 mg by mouth in the morning.  02/07/17   Arrien, Jimmy Picket, MD    Allergies    Bactrim [sulfamethoxazole-trimethoprim]  Review of Systems   Review of Systems  Unable to perform ROS: Dementia    Physical Exam Updated Vital Signs BP 135/65 (BP Location: Right Arm)   Pulse 62   Temp 98.3 F (36.8 C) (Oral)   Resp 16   Ht 5\' 7"  (1.702 m)   Wt 86.2 kg   SpO2 95%   BMI 29.76 kg/m   Physical Exam Vitals and nursing note reviewed.  Constitutional:      General: He is not in acute distress.    Appearance: He is well-developed. He is not ill-appearing or diaphoretic.     Interventions: Cervical collar in place.  HENT:     Head: Normocephalic and atraumatic.     Comments: Small occiput ecchymosis. No laceration or tenderness    Right Ear: External ear normal.     Left Ear: External ear normal.     Nose: Nose normal.  Eyes:     General:        Right eye: No discharge.        Left eye: No discharge.     Extraocular Movements: Extraocular movements intact.  Cardiovascular:     Rate and Rhythm: Normal rate and regular rhythm.     Heart sounds: Normal heart sounds.    Pulmonary:     Effort: Pulmonary effort is normal.     Breath sounds: Normal breath sounds.  Abdominal:     General: There is no distension.     Palpations: Abdomen is soft.     Tenderness: There is no abdominal tenderness.  Musculoskeletal:     Cervical back: Neck supple.  No tenderness.     Thoracic back: Tenderness present.     Lumbar back: No tenderness.     Right hip: Normal range of motion.     Left hip: Normal range of motion.  Skin:    General: Skin is warm and dry.  Neurological:     Mental Status: He is alert.     Comments: CN 3-12 grossly intact. 5/5 strength in all 4 extremities. Grossly normal sensation. Normal finger to nose.   Psychiatric:        Mood and Affect: Mood is not anxious.     ED Results / Procedures / Treatments   Labs (all labs ordered are listed, but only abnormal results are displayed) Labs Reviewed - No data to display  EKG None  Radiology DG Thoracic Spine 2 View  Result Date: 04/22/2020 CLINICAL DATA:  Fall, upper back pain EXAM: THORACIC SPINE 2 VIEWS COMPARISON:  None. FINDINGS: Mild degenerative changes throughout the thoracic spine. No fracture or focal bone lesion. IMPRESSION: No acute bony abnormality. Electronically Signed   By: Rolm Baptise M.D.   On: 04/22/2020 20:53   CT Head Wo Contrast  Result Date: 04/22/2020 CLINICAL DATA:  Fall. EXAM: CT HEAD WITHOUT CONTRAST CT CERVICAL SPINE WITHOUT CONTRAST TECHNIQUE: Multidetector CT imaging of the head and cervical spine was performed following the standard protocol without intravenous contrast. Multiplanar CT image reconstructions of the cervical spine were also generated. COMPARISON:  Head CT 03/08/2019.  Cervical spine CT 12/10/2011. FINDINGS: CT HEAD FINDINGS Brain: There is no evidence of an acute infarct, intracranial hemorrhage, mass, midline shift, or extra-axial fluid collection. Hypodensities in the cerebral white matter bilaterally are nonspecific but compatible with mild chronic  small vessel ischemic disease. Mild cerebral atrophy is within normal limits for age. Vascular: Calcified atherosclerosis at the skull base. No hyperdense vessel. Skull: No fracture or suspicious osseous lesion. Sinuses/Orbits: Minimal mucosal thickening inferiorly in the left maxillary sinus adjacent to a first molar tooth extraction site. Clear mastoid air cells. Bilateral cataract extraction and scleral banding. Other: None. CT CERVICAL SPINE FINDINGS Alignment: Minimal chronic retrolisthesis of C3 on C4 and anterolisthesis of C4 on C5 and C5 on C6. Skull base and vertebrae: No acute fracture or suspicious osseous lesion. Soft tissues and spinal canal: No prevertebral fluid or swelling. No visible canal hematoma. Disc levels: Advanced disc degeneration at C5-6 and C6-7. Fusion across the C5-6 disc space posteriorly. Asymmetrically advanced left facet arthrosis from C3-4 to C5-6 with facet ankylosis at C5-6. Moderate neural foraminal stenosis due to uncovertebral and facet spurring bilaterally at C3-4 and on the left at C4-5 and C5-6. Mild-to-moderate spinal stenosis at C3-4 and C6-7. Upper chest: No apical lung consolidation or mass. Other: Mild calcific atherosclerosis at the carotid bifurcations. Asymmetric fatty replacement of the left parotid gland. IMPRESSION: 1. No evidence of acute intracranial abnormality. 2. Mild chronic small vessel ischemic disease. 3. No evidence of acute cervical spine fracture. Advanced disc and facet degeneration. Electronically Signed   By: Logan Bores M.D.   On: 04/22/2020 20:40   CT Cervical Spine Wo Contrast  Result Date: 04/22/2020 CLINICAL DATA:  Fall. EXAM: CT HEAD WITHOUT CONTRAST CT CERVICAL SPINE WITHOUT CONTRAST TECHNIQUE: Multidetector CT imaging of the head and cervical spine was performed following the standard protocol without intravenous contrast. Multiplanar CT image reconstructions of the cervical spine were also generated. COMPARISON:  Head CT 03/08/2019.   Cervical spine CT 12/10/2011. FINDINGS: CT HEAD FINDINGS Brain: There is no evidence of  an acute infarct, intracranial hemorrhage, mass, midline shift, or extra-axial fluid collection. Hypodensities in the cerebral white matter bilaterally are nonspecific but compatible with mild chronic small vessel ischemic disease. Mild cerebral atrophy is within normal limits for age. Vascular: Calcified atherosclerosis at the skull base. No hyperdense vessel. Skull: No fracture or suspicious osseous lesion. Sinuses/Orbits: Minimal mucosal thickening inferiorly in the left maxillary sinus adjacent to a first molar tooth extraction site. Clear mastoid air cells. Bilateral cataract extraction and scleral banding. Other: None. CT CERVICAL SPINE FINDINGS Alignment: Minimal chronic retrolisthesis of C3 on C4 and anterolisthesis of C4 on C5 and C5 on C6. Skull base and vertebrae: No acute fracture or suspicious osseous lesion. Soft tissues and spinal canal: No prevertebral fluid or swelling. No visible canal hematoma. Disc levels: Advanced disc degeneration at C5-6 and C6-7. Fusion across the C5-6 disc space posteriorly. Asymmetrically advanced left facet arthrosis from C3-4 to C5-6 with facet ankylosis at C5-6. Moderate neural foraminal stenosis due to uncovertebral and facet spurring bilaterally at C3-4 and on the left at C4-5 and C5-6. Mild-to-moderate spinal stenosis at C3-4 and C6-7. Upper chest: No apical lung consolidation or mass. Other: Mild calcific atherosclerosis at the carotid bifurcations. Asymmetric fatty replacement of the left parotid gland. IMPRESSION: 1. No evidence of acute intracranial abnormality. 2. Mild chronic small vessel ischemic disease. 3. No evidence of acute cervical spine fracture. Advanced disc and facet degeneration. Electronically Signed   By: Logan Bores M.D.   On: 04/22/2020 20:40    Procedures Procedures (including critical care time)  Medications Ordered in ED Medications - No data to  display  ED Course  I have reviewed the triage vital signs and the nursing notes.  Pertinent labs & imaging results that were available during my care of the patient were reviewed by me and considered in my medical decision making (see chart for details).    MDM Rules/Calculators/A&P                          Patient presents after a trip and fall.  Fortunately his CT head, C-spine and his thoracic x-ray are all benign.  No hip pain or tenderness.  Vitals are unremarkable.  He appears stable for discharge. Final Clinical Impression(s) / ED Diagnoses Final diagnoses:  Fall, initial encounter  Minor head injury, initial encounter    Rx / DC Orders ED Discharge Orders    None       Sherwood Gambler, MD 04/22/20 2248

## 2020-04-22 NOTE — ED Triage Notes (Signed)
Pt arrived via EMS from [facility] from the memory care unit. Pt is coming to ED for a mechanical fall after walking in the lobby when his walker got caught up on the carpet and pt tripped and fell w/ impact to occiput after hitting it on the corner of a dresser. Pt hx of dementia A&Ox2 at baseline. Facility reported no LOC. Fall was witnessed. Not on blood thinners.

## 2020-04-22 NOTE — ED Notes (Signed)
Reviewed discharge instructions with patient and spouse. Follow-up care reviewed. Spouse verbalized understanding. Patient A&Ox2, VSS upon discharge. Pt's wife transporting pt home.

## 2020-04-25 ENCOUNTER — Telehealth: Payer: Self-pay | Admitting: Neurology

## 2020-04-25 DIAGNOSIS — R296 Repeated falls: Secondary | ICD-10-CM

## 2020-04-25 DIAGNOSIS — F0391 Unspecified dementia with behavioral disturbance: Secondary | ICD-10-CM

## 2020-04-25 DIAGNOSIS — F03B18 Unspecified dementia, moderate, with other behavioral disturbance: Secondary | ICD-10-CM

## 2020-04-25 NOTE — Telephone Encounter (Signed)
Informed pt wife that referral to Hospice, we can try to order hospital bed

## 2020-04-25 NOTE — Telephone Encounter (Signed)
[facility] for referral to Hospice, we can try hospital bed order also. thanks

## 2020-04-25 NOTE — Telephone Encounter (Signed)
Patient's wife called and said, "My husband saw Dr. Delice Lesch last week then was placed in a memory care facility for about a week. While he was there, he had two falls and I had to take him to the emergency room after the last one. Now I have him at home and I'd like to request Dr. Delice Lesch place an order to evaluate him for hospice care. He could also use a hospital bed to help reduce his falls."

## 2020-04-25 NOTE — Addendum Note (Signed)
Addended by: Jake Seats on: 04/25/2020 02:19 PM   Modules accepted: Orders

## 2020-04-26 DIAGNOSIS — G311 Senile degeneration of brain, not elsewhere classified: Secondary | ICD-10-CM | POA: Diagnosis not present

## 2020-04-26 DIAGNOSIS — H3320 Serous retinal detachment, unspecified eye: Secondary | ICD-10-CM | POA: Diagnosis not present

## 2020-04-26 DIAGNOSIS — R296 Repeated falls: Secondary | ICD-10-CM | POA: Diagnosis not present

## 2020-04-26 DIAGNOSIS — Z951 Presence of aortocoronary bypass graft: Secondary | ICD-10-CM | POA: Diagnosis not present

## 2020-04-26 DIAGNOSIS — I2510 Atherosclerotic heart disease of native coronary artery without angina pectoris: Secondary | ICD-10-CM | POA: Diagnosis not present

## 2020-04-26 DIAGNOSIS — Z515 Encounter for palliative care: Secondary | ICD-10-CM | POA: Diagnosis not present

## 2020-04-26 DIAGNOSIS — R6881 Early satiety: Secondary | ICD-10-CM | POA: Diagnosis not present

## 2020-04-26 DIAGNOSIS — N179 Acute kidney failure, unspecified: Secondary | ICD-10-CM | POA: Diagnosis not present

## 2020-04-26 DIAGNOSIS — E119 Type 2 diabetes mellitus without complications: Secondary | ICD-10-CM | POA: Diagnosis not present

## 2020-04-26 DIAGNOSIS — N401 Enlarged prostate with lower urinary tract symptoms: Secondary | ICD-10-CM | POA: Diagnosis not present

## 2020-04-26 DIAGNOSIS — I10 Essential (primary) hypertension: Secondary | ICD-10-CM | POA: Diagnosis not present

## 2020-04-26 DIAGNOSIS — K219 Gastro-esophageal reflux disease without esophagitis: Secondary | ICD-10-CM | POA: Diagnosis not present

## 2020-04-26 DIAGNOSIS — M7542 Impingement syndrome of left shoulder: Secondary | ICD-10-CM | POA: Diagnosis not present

## 2020-04-26 DIAGNOSIS — Z950 Presence of cardiac pacemaker: Secondary | ICD-10-CM | POA: Diagnosis not present

## 2020-04-26 DIAGNOSIS — F0281 Dementia in other diseases classified elsewhere with behavioral disturbance: Secondary | ICD-10-CM | POA: Diagnosis not present

## 2020-04-26 DIAGNOSIS — N138 Other obstructive and reflux uropathy: Secondary | ICD-10-CM | POA: Diagnosis not present

## 2020-04-26 DIAGNOSIS — E785 Hyperlipidemia, unspecified: Secondary | ICD-10-CM | POA: Diagnosis not present

## 2020-04-26 DIAGNOSIS — I441 Atrioventricular block, second degree: Secondary | ICD-10-CM | POA: Diagnosis not present

## 2020-04-27 DIAGNOSIS — R296 Repeated falls: Secondary | ICD-10-CM | POA: Diagnosis not present

## 2020-04-27 DIAGNOSIS — I10 Essential (primary) hypertension: Secondary | ICD-10-CM | POA: Diagnosis not present

## 2020-04-27 DIAGNOSIS — G311 Senile degeneration of brain, not elsewhere classified: Secondary | ICD-10-CM | POA: Diagnosis not present

## 2020-04-27 DIAGNOSIS — F0281 Dementia in other diseases classified elsewhere with behavioral disturbance: Secondary | ICD-10-CM | POA: Diagnosis not present

## 2020-04-27 DIAGNOSIS — I441 Atrioventricular block, second degree: Secondary | ICD-10-CM | POA: Diagnosis not present

## 2020-04-27 DIAGNOSIS — I2510 Atherosclerotic heart disease of native coronary artery without angina pectoris: Secondary | ICD-10-CM | POA: Diagnosis not present

## 2020-04-28 ENCOUNTER — Telehealth: Payer: Self-pay

## 2020-04-28 DIAGNOSIS — R296 Repeated falls: Secondary | ICD-10-CM | POA: Diagnosis not present

## 2020-04-28 DIAGNOSIS — I10 Essential (primary) hypertension: Secondary | ICD-10-CM | POA: Diagnosis not present

## 2020-04-28 DIAGNOSIS — I2510 Atherosclerotic heart disease of native coronary artery without angina pectoris: Secondary | ICD-10-CM | POA: Diagnosis not present

## 2020-04-28 DIAGNOSIS — F0281 Dementia in other diseases classified elsewhere with behavioral disturbance: Secondary | ICD-10-CM | POA: Diagnosis not present

## 2020-04-28 DIAGNOSIS — G311 Senile degeneration of brain, not elsewhere classified: Secondary | ICD-10-CM | POA: Diagnosis not present

## 2020-04-28 DIAGNOSIS — I441 Atrioventricular block, second degree: Secondary | ICD-10-CM | POA: Diagnosis not present

## 2020-04-28 NOTE — Telephone Encounter (Signed)
Needs his office notes to say something about why he needs the hospital bed and the gel over lay mattress for the the bed so that his insurance will cover it,

## 2020-05-01 DIAGNOSIS — I441 Atrioventricular block, second degree: Secondary | ICD-10-CM | POA: Diagnosis not present

## 2020-05-01 DIAGNOSIS — G311 Senile degeneration of brain, not elsewhere classified: Secondary | ICD-10-CM | POA: Diagnosis not present

## 2020-05-01 DIAGNOSIS — R296 Repeated falls: Secondary | ICD-10-CM | POA: Diagnosis not present

## 2020-05-01 DIAGNOSIS — F0281 Dementia in other diseases classified elsewhere with behavioral disturbance: Secondary | ICD-10-CM | POA: Diagnosis not present

## 2020-05-01 DIAGNOSIS — I2510 Atherosclerotic heart disease of native coronary artery without angina pectoris: Secondary | ICD-10-CM | POA: Diagnosis not present

## 2020-05-01 DIAGNOSIS — I10 Essential (primary) hypertension: Secondary | ICD-10-CM | POA: Diagnosis not present

## 2020-05-02 DIAGNOSIS — I441 Atrioventricular block, second degree: Secondary | ICD-10-CM | POA: Diagnosis not present

## 2020-05-02 DIAGNOSIS — I10 Essential (primary) hypertension: Secondary | ICD-10-CM | POA: Diagnosis not present

## 2020-05-02 DIAGNOSIS — I2510 Atherosclerotic heart disease of native coronary artery without angina pectoris: Secondary | ICD-10-CM | POA: Diagnosis not present

## 2020-05-02 DIAGNOSIS — R296 Repeated falls: Secondary | ICD-10-CM | POA: Diagnosis not present

## 2020-05-02 DIAGNOSIS — F0281 Dementia in other diseases classified elsewhere with behavioral disturbance: Secondary | ICD-10-CM | POA: Diagnosis not present

## 2020-05-02 DIAGNOSIS — G311 Senile degeneration of brain, not elsewhere classified: Secondary | ICD-10-CM | POA: Diagnosis not present

## 2020-05-03 DIAGNOSIS — I10 Essential (primary) hypertension: Secondary | ICD-10-CM | POA: Diagnosis not present

## 2020-05-03 DIAGNOSIS — R296 Repeated falls: Secondary | ICD-10-CM | POA: Diagnosis not present

## 2020-05-03 DIAGNOSIS — F0281 Dementia in other diseases classified elsewhere with behavioral disturbance: Secondary | ICD-10-CM | POA: Diagnosis not present

## 2020-05-03 DIAGNOSIS — I441 Atrioventricular block, second degree: Secondary | ICD-10-CM | POA: Diagnosis not present

## 2020-05-03 DIAGNOSIS — I2510 Atherosclerotic heart disease of native coronary artery without angina pectoris: Secondary | ICD-10-CM | POA: Diagnosis not present

## 2020-05-03 DIAGNOSIS — G311 Senile degeneration of brain, not elsewhere classified: Secondary | ICD-10-CM | POA: Diagnosis not present

## 2020-05-05 DIAGNOSIS — I441 Atrioventricular block, second degree: Secondary | ICD-10-CM | POA: Diagnosis not present

## 2020-05-05 DIAGNOSIS — I2510 Atherosclerotic heart disease of native coronary artery without angina pectoris: Secondary | ICD-10-CM | POA: Diagnosis not present

## 2020-05-05 DIAGNOSIS — F0281 Dementia in other diseases classified elsewhere with behavioral disturbance: Secondary | ICD-10-CM | POA: Diagnosis not present

## 2020-05-05 DIAGNOSIS — I10 Essential (primary) hypertension: Secondary | ICD-10-CM | POA: Diagnosis not present

## 2020-05-05 DIAGNOSIS — R296 Repeated falls: Secondary | ICD-10-CM | POA: Diagnosis not present

## 2020-05-05 DIAGNOSIS — G311 Senile degeneration of brain, not elsewhere classified: Secondary | ICD-10-CM | POA: Diagnosis not present

## 2020-05-08 DIAGNOSIS — Z951 Presence of aortocoronary bypass graft: Secondary | ICD-10-CM | POA: Diagnosis not present

## 2020-05-08 DIAGNOSIS — R296 Repeated falls: Secondary | ICD-10-CM | POA: Diagnosis not present

## 2020-05-08 DIAGNOSIS — N401 Enlarged prostate with lower urinary tract symptoms: Secondary | ICD-10-CM | POA: Diagnosis not present

## 2020-05-08 DIAGNOSIS — E785 Hyperlipidemia, unspecified: Secondary | ICD-10-CM | POA: Diagnosis not present

## 2020-05-08 DIAGNOSIS — K219 Gastro-esophageal reflux disease without esophagitis: Secondary | ICD-10-CM | POA: Diagnosis not present

## 2020-05-08 DIAGNOSIS — G311 Senile degeneration of brain, not elsewhere classified: Secondary | ICD-10-CM | POA: Diagnosis not present

## 2020-05-08 DIAGNOSIS — N138 Other obstructive and reflux uropathy: Secondary | ICD-10-CM | POA: Diagnosis not present

## 2020-05-08 DIAGNOSIS — I10 Essential (primary) hypertension: Secondary | ICD-10-CM | POA: Diagnosis not present

## 2020-05-08 DIAGNOSIS — Z950 Presence of cardiac pacemaker: Secondary | ICD-10-CM | POA: Diagnosis not present

## 2020-05-08 DIAGNOSIS — Z515 Encounter for palliative care: Secondary | ICD-10-CM | POA: Diagnosis not present

## 2020-05-08 DIAGNOSIS — H3320 Serous retinal detachment, unspecified eye: Secondary | ICD-10-CM | POA: Diagnosis not present

## 2020-05-08 DIAGNOSIS — M7542 Impingement syndrome of left shoulder: Secondary | ICD-10-CM | POA: Diagnosis not present

## 2020-05-08 DIAGNOSIS — N179 Acute kidney failure, unspecified: Secondary | ICD-10-CM | POA: Diagnosis not present

## 2020-05-08 DIAGNOSIS — I441 Atrioventricular block, second degree: Secondary | ICD-10-CM | POA: Diagnosis not present

## 2020-05-08 DIAGNOSIS — R6881 Early satiety: Secondary | ICD-10-CM | POA: Diagnosis not present

## 2020-05-08 DIAGNOSIS — F0281 Dementia in other diseases classified elsewhere with behavioral disturbance: Secondary | ICD-10-CM | POA: Diagnosis not present

## 2020-05-08 DIAGNOSIS — E119 Type 2 diabetes mellitus without complications: Secondary | ICD-10-CM | POA: Diagnosis not present

## 2020-05-08 DIAGNOSIS — I2510 Atherosclerotic heart disease of native coronary artery without angina pectoris: Secondary | ICD-10-CM | POA: Diagnosis not present

## 2020-05-10 DIAGNOSIS — F0281 Dementia in other diseases classified elsewhere with behavioral disturbance: Secondary | ICD-10-CM | POA: Diagnosis not present

## 2020-05-10 DIAGNOSIS — N401 Enlarged prostate with lower urinary tract symptoms: Secondary | ICD-10-CM | POA: Diagnosis not present

## 2020-05-10 DIAGNOSIS — G311 Senile degeneration of brain, not elsewhere classified: Secondary | ICD-10-CM | POA: Diagnosis not present

## 2020-05-10 DIAGNOSIS — R296 Repeated falls: Secondary | ICD-10-CM | POA: Diagnosis not present

## 2020-05-10 DIAGNOSIS — I2510 Atherosclerotic heart disease of native coronary artery without angina pectoris: Secondary | ICD-10-CM | POA: Diagnosis not present

## 2020-05-10 DIAGNOSIS — I441 Atrioventricular block, second degree: Secondary | ICD-10-CM | POA: Diagnosis not present

## 2020-05-10 DIAGNOSIS — R972 Elevated prostate specific antigen [PSA]: Secondary | ICD-10-CM | POA: Diagnosis not present

## 2020-05-10 DIAGNOSIS — N138 Other obstructive and reflux uropathy: Secondary | ICD-10-CM | POA: Diagnosis not present

## 2020-05-10 DIAGNOSIS — I10 Essential (primary) hypertension: Secondary | ICD-10-CM | POA: Diagnosis not present

## 2020-05-12 DIAGNOSIS — R296 Repeated falls: Secondary | ICD-10-CM | POA: Diagnosis not present

## 2020-05-12 DIAGNOSIS — I2510 Atherosclerotic heart disease of native coronary artery without angina pectoris: Secondary | ICD-10-CM | POA: Diagnosis not present

## 2020-05-12 DIAGNOSIS — F0281 Dementia in other diseases classified elsewhere with behavioral disturbance: Secondary | ICD-10-CM | POA: Diagnosis not present

## 2020-05-12 DIAGNOSIS — G311 Senile degeneration of brain, not elsewhere classified: Secondary | ICD-10-CM | POA: Diagnosis not present

## 2020-05-12 DIAGNOSIS — I441 Atrioventricular block, second degree: Secondary | ICD-10-CM | POA: Diagnosis not present

## 2020-05-12 DIAGNOSIS — I10 Essential (primary) hypertension: Secondary | ICD-10-CM | POA: Diagnosis not present

## 2020-05-17 DIAGNOSIS — I441 Atrioventricular block, second degree: Secondary | ICD-10-CM | POA: Diagnosis not present

## 2020-05-17 DIAGNOSIS — R296 Repeated falls: Secondary | ICD-10-CM | POA: Diagnosis not present

## 2020-05-17 DIAGNOSIS — I2510 Atherosclerotic heart disease of native coronary artery without angina pectoris: Secondary | ICD-10-CM | POA: Diagnosis not present

## 2020-05-17 DIAGNOSIS — F0281 Dementia in other diseases classified elsewhere with behavioral disturbance: Secondary | ICD-10-CM | POA: Diagnosis not present

## 2020-05-17 DIAGNOSIS — I10 Essential (primary) hypertension: Secondary | ICD-10-CM | POA: Diagnosis not present

## 2020-05-17 DIAGNOSIS — G311 Senile degeneration of brain, not elsewhere classified: Secondary | ICD-10-CM | POA: Diagnosis not present

## 2020-05-19 DIAGNOSIS — R296 Repeated falls: Secondary | ICD-10-CM | POA: Diagnosis not present

## 2020-05-19 DIAGNOSIS — F0281 Dementia in other diseases classified elsewhere with behavioral disturbance: Secondary | ICD-10-CM | POA: Diagnosis not present

## 2020-05-19 DIAGNOSIS — I10 Essential (primary) hypertension: Secondary | ICD-10-CM | POA: Diagnosis not present

## 2020-05-19 DIAGNOSIS — I2510 Atherosclerotic heart disease of native coronary artery without angina pectoris: Secondary | ICD-10-CM | POA: Diagnosis not present

## 2020-05-19 DIAGNOSIS — I441 Atrioventricular block, second degree: Secondary | ICD-10-CM | POA: Diagnosis not present

## 2020-05-19 DIAGNOSIS — G311 Senile degeneration of brain, not elsewhere classified: Secondary | ICD-10-CM | POA: Diagnosis not present

## 2020-05-22 DIAGNOSIS — R296 Repeated falls: Secondary | ICD-10-CM | POA: Diagnosis not present

## 2020-05-22 DIAGNOSIS — G311 Senile degeneration of brain, not elsewhere classified: Secondary | ICD-10-CM | POA: Diagnosis not present

## 2020-05-22 DIAGNOSIS — I441 Atrioventricular block, second degree: Secondary | ICD-10-CM | POA: Diagnosis not present

## 2020-05-22 DIAGNOSIS — I10 Essential (primary) hypertension: Secondary | ICD-10-CM | POA: Diagnosis not present

## 2020-05-22 DIAGNOSIS — I2510 Atherosclerotic heart disease of native coronary artery without angina pectoris: Secondary | ICD-10-CM | POA: Diagnosis not present

## 2020-05-22 DIAGNOSIS — F0281 Dementia in other diseases classified elsewhere with behavioral disturbance: Secondary | ICD-10-CM | POA: Diagnosis not present

## 2020-05-24 DIAGNOSIS — G311 Senile degeneration of brain, not elsewhere classified: Secondary | ICD-10-CM | POA: Diagnosis not present

## 2020-05-24 DIAGNOSIS — I10 Essential (primary) hypertension: Secondary | ICD-10-CM | POA: Diagnosis not present

## 2020-05-24 DIAGNOSIS — I441 Atrioventricular block, second degree: Secondary | ICD-10-CM | POA: Diagnosis not present

## 2020-05-24 DIAGNOSIS — I2510 Atherosclerotic heart disease of native coronary artery without angina pectoris: Secondary | ICD-10-CM | POA: Diagnosis not present

## 2020-05-24 DIAGNOSIS — F0281 Dementia in other diseases classified elsewhere with behavioral disturbance: Secondary | ICD-10-CM | POA: Diagnosis not present

## 2020-05-24 DIAGNOSIS — R296 Repeated falls: Secondary | ICD-10-CM | POA: Diagnosis not present

## 2020-05-25 DIAGNOSIS — I441 Atrioventricular block, second degree: Secondary | ICD-10-CM | POA: Diagnosis not present

## 2020-05-25 DIAGNOSIS — F0281 Dementia in other diseases classified elsewhere with behavioral disturbance: Secondary | ICD-10-CM | POA: Diagnosis not present

## 2020-05-25 DIAGNOSIS — R296 Repeated falls: Secondary | ICD-10-CM | POA: Diagnosis not present

## 2020-05-25 DIAGNOSIS — G311 Senile degeneration of brain, not elsewhere classified: Secondary | ICD-10-CM | POA: Diagnosis not present

## 2020-05-25 DIAGNOSIS — I2510 Atherosclerotic heart disease of native coronary artery without angina pectoris: Secondary | ICD-10-CM | POA: Diagnosis not present

## 2020-05-25 DIAGNOSIS — I10 Essential (primary) hypertension: Secondary | ICD-10-CM | POA: Diagnosis not present

## 2020-05-26 DIAGNOSIS — F0281 Dementia in other diseases classified elsewhere with behavioral disturbance: Secondary | ICD-10-CM | POA: Diagnosis not present

## 2020-05-26 DIAGNOSIS — I2510 Atherosclerotic heart disease of native coronary artery without angina pectoris: Secondary | ICD-10-CM | POA: Diagnosis not present

## 2020-05-26 DIAGNOSIS — G311 Senile degeneration of brain, not elsewhere classified: Secondary | ICD-10-CM | POA: Diagnosis not present

## 2020-05-26 DIAGNOSIS — R296 Repeated falls: Secondary | ICD-10-CM | POA: Diagnosis not present

## 2020-05-26 DIAGNOSIS — I441 Atrioventricular block, second degree: Secondary | ICD-10-CM | POA: Diagnosis not present

## 2020-05-26 DIAGNOSIS — I10 Essential (primary) hypertension: Secondary | ICD-10-CM | POA: Diagnosis not present

## 2020-05-26 NOTE — Telephone Encounter (Signed)
They may have followed up with his PCP I had talked to them about it. We can close this encounter,

## 2020-05-26 NOTE — Telephone Encounter (Signed)
We haven't heard back about this, can we close encounter? Thanks

## 2020-05-29 DIAGNOSIS — I441 Atrioventricular block, second degree: Secondary | ICD-10-CM | POA: Diagnosis not present

## 2020-05-29 DIAGNOSIS — F0281 Dementia in other diseases classified elsewhere with behavioral disturbance: Secondary | ICD-10-CM | POA: Diagnosis not present

## 2020-05-29 DIAGNOSIS — R296 Repeated falls: Secondary | ICD-10-CM | POA: Diagnosis not present

## 2020-05-29 DIAGNOSIS — G311 Senile degeneration of brain, not elsewhere classified: Secondary | ICD-10-CM | POA: Diagnosis not present

## 2020-05-29 DIAGNOSIS — I2510 Atherosclerotic heart disease of native coronary artery without angina pectoris: Secondary | ICD-10-CM | POA: Diagnosis not present

## 2020-05-29 DIAGNOSIS — I10 Essential (primary) hypertension: Secondary | ICD-10-CM | POA: Diagnosis not present

## 2020-05-31 DIAGNOSIS — I10 Essential (primary) hypertension: Secondary | ICD-10-CM | POA: Diagnosis not present

## 2020-05-31 DIAGNOSIS — F0281 Dementia in other diseases classified elsewhere with behavioral disturbance: Secondary | ICD-10-CM | POA: Diagnosis not present

## 2020-05-31 DIAGNOSIS — R296 Repeated falls: Secondary | ICD-10-CM | POA: Diagnosis not present

## 2020-05-31 DIAGNOSIS — I441 Atrioventricular block, second degree: Secondary | ICD-10-CM | POA: Diagnosis not present

## 2020-05-31 DIAGNOSIS — G311 Senile degeneration of brain, not elsewhere classified: Secondary | ICD-10-CM | POA: Diagnosis not present

## 2020-05-31 DIAGNOSIS — I2510 Atherosclerotic heart disease of native coronary artery without angina pectoris: Secondary | ICD-10-CM | POA: Diagnosis not present

## 2020-06-02 DIAGNOSIS — F0281 Dementia in other diseases classified elsewhere with behavioral disturbance: Secondary | ICD-10-CM | POA: Diagnosis not present

## 2020-06-02 DIAGNOSIS — I2510 Atherosclerotic heart disease of native coronary artery without angina pectoris: Secondary | ICD-10-CM | POA: Diagnosis not present

## 2020-06-02 DIAGNOSIS — I441 Atrioventricular block, second degree: Secondary | ICD-10-CM | POA: Diagnosis not present

## 2020-06-02 DIAGNOSIS — I10 Essential (primary) hypertension: Secondary | ICD-10-CM | POA: Diagnosis not present

## 2020-06-02 DIAGNOSIS — R296 Repeated falls: Secondary | ICD-10-CM | POA: Diagnosis not present

## 2020-06-02 DIAGNOSIS — G311 Senile degeneration of brain, not elsewhere classified: Secondary | ICD-10-CM | POA: Diagnosis not present

## 2020-06-07 ENCOUNTER — Ambulatory Visit (INDEPENDENT_AMBULATORY_CARE_PROVIDER_SITE_OTHER): Payer: Medicare Other

## 2020-06-07 DIAGNOSIS — Z515 Encounter for palliative care: Secondary | ICD-10-CM | POA: Diagnosis not present

## 2020-06-07 DIAGNOSIS — I10 Essential (primary) hypertension: Secondary | ICD-10-CM | POA: Diagnosis not present

## 2020-06-07 DIAGNOSIS — E119 Type 2 diabetes mellitus without complications: Secondary | ICD-10-CM | POA: Diagnosis not present

## 2020-06-07 DIAGNOSIS — N138 Other obstructive and reflux uropathy: Secondary | ICD-10-CM | POA: Diagnosis not present

## 2020-06-07 DIAGNOSIS — R6881 Early satiety: Secondary | ICD-10-CM | POA: Diagnosis not present

## 2020-06-07 DIAGNOSIS — M7542 Impingement syndrome of left shoulder: Secondary | ICD-10-CM | POA: Diagnosis not present

## 2020-06-07 DIAGNOSIS — F0281 Dementia in other diseases classified elsewhere with behavioral disturbance: Secondary | ICD-10-CM | POA: Diagnosis not present

## 2020-06-07 DIAGNOSIS — E785 Hyperlipidemia, unspecified: Secondary | ICD-10-CM | POA: Diagnosis not present

## 2020-06-07 DIAGNOSIS — I441 Atrioventricular block, second degree: Secondary | ICD-10-CM | POA: Diagnosis not present

## 2020-06-07 DIAGNOSIS — H3320 Serous retinal detachment, unspecified eye: Secondary | ICD-10-CM | POA: Diagnosis not present

## 2020-06-07 DIAGNOSIS — G311 Senile degeneration of brain, not elsewhere classified: Secondary | ICD-10-CM | POA: Diagnosis not present

## 2020-06-07 DIAGNOSIS — N179 Acute kidney failure, unspecified: Secondary | ICD-10-CM | POA: Diagnosis not present

## 2020-06-07 DIAGNOSIS — Z950 Presence of cardiac pacemaker: Secondary | ICD-10-CM | POA: Diagnosis not present

## 2020-06-07 DIAGNOSIS — I2510 Atherosclerotic heart disease of native coronary artery without angina pectoris: Secondary | ICD-10-CM | POA: Diagnosis not present

## 2020-06-07 DIAGNOSIS — N401 Enlarged prostate with lower urinary tract symptoms: Secondary | ICD-10-CM | POA: Diagnosis not present

## 2020-06-07 DIAGNOSIS — K219 Gastro-esophageal reflux disease without esophagitis: Secondary | ICD-10-CM | POA: Diagnosis not present

## 2020-06-07 DIAGNOSIS — R296 Repeated falls: Secondary | ICD-10-CM | POA: Diagnosis not present

## 2020-06-07 DIAGNOSIS — Z951 Presence of aortocoronary bypass graft: Secondary | ICD-10-CM | POA: Diagnosis not present

## 2020-06-08 ENCOUNTER — Telehealth: Payer: Self-pay | Admitting: Family Medicine

## 2020-06-08 ENCOUNTER — Other Ambulatory Visit: Payer: Self-pay | Admitting: *Deleted

## 2020-06-08 DIAGNOSIS — F0391 Unspecified dementia with behavioral disturbance: Secondary | ICD-10-CM

## 2020-06-08 LAB — CUP PACEART REMOTE DEVICE CHECK
Battery Impedance: 728 Ohm
Battery Remaining Longevity: 90 mo
Battery Voltage: 2.78 V
Brady Statistic AP VP Percent: 0 %
Brady Statistic AP VS Percent: 43 %
Brady Statistic AS VP Percent: 0 %
Brady Statistic AS VS Percent: 57 %
Date Time Interrogation Session: 20211201093754
Implantable Lead Implant Date: 20130605
Implantable Lead Implant Date: 20130605
Implantable Lead Location: 753859
Implantable Lead Location: 753860
Implantable Lead Model: 5076
Implantable Lead Model: 5076
Implantable Pulse Generator Implant Date: 20130605
Lead Channel Impedance Value: 473 Ohm
Lead Channel Impedance Value: 939 Ohm
Lead Channel Pacing Threshold Amplitude: 0.5 V
Lead Channel Pacing Threshold Amplitude: 0.625 V
Lead Channel Pacing Threshold Pulse Width: 0.4 ms
Lead Channel Pacing Threshold Pulse Width: 0.4 ms
Lead Channel Setting Pacing Amplitude: 1.5 V
Lead Channel Setting Pacing Amplitude: 2 V
Lead Channel Setting Pacing Pulse Width: 0.4 ms
Lead Channel Setting Sensing Sensitivity: 2.8 mV

## 2020-06-09 DIAGNOSIS — I441 Atrioventricular block, second degree: Secondary | ICD-10-CM | POA: Diagnosis not present

## 2020-06-09 DIAGNOSIS — F0281 Dementia in other diseases classified elsewhere with behavioral disturbance: Secondary | ICD-10-CM | POA: Diagnosis not present

## 2020-06-09 DIAGNOSIS — R296 Repeated falls: Secondary | ICD-10-CM | POA: Diagnosis not present

## 2020-06-09 DIAGNOSIS — I2510 Atherosclerotic heart disease of native coronary artery without angina pectoris: Secondary | ICD-10-CM | POA: Diagnosis not present

## 2020-06-09 DIAGNOSIS — I10 Essential (primary) hypertension: Secondary | ICD-10-CM | POA: Diagnosis not present

## 2020-06-09 DIAGNOSIS — G311 Senile degeneration of brain, not elsewhere classified: Secondary | ICD-10-CM | POA: Diagnosis not present

## 2020-06-12 DIAGNOSIS — G311 Senile degeneration of brain, not elsewhere classified: Secondary | ICD-10-CM | POA: Diagnosis not present

## 2020-06-12 DIAGNOSIS — F0281 Dementia in other diseases classified elsewhere with behavioral disturbance: Secondary | ICD-10-CM | POA: Diagnosis not present

## 2020-06-12 DIAGNOSIS — I441 Atrioventricular block, second degree: Secondary | ICD-10-CM | POA: Diagnosis not present

## 2020-06-12 DIAGNOSIS — I2510 Atherosclerotic heart disease of native coronary artery without angina pectoris: Secondary | ICD-10-CM | POA: Diagnosis not present

## 2020-06-12 DIAGNOSIS — R296 Repeated falls: Secondary | ICD-10-CM | POA: Diagnosis not present

## 2020-06-12 DIAGNOSIS — I10 Essential (primary) hypertension: Secondary | ICD-10-CM | POA: Diagnosis not present

## 2020-06-13 DIAGNOSIS — R296 Repeated falls: Secondary | ICD-10-CM | POA: Diagnosis not present

## 2020-06-13 DIAGNOSIS — I2510 Atherosclerotic heart disease of native coronary artery without angina pectoris: Secondary | ICD-10-CM | POA: Diagnosis not present

## 2020-06-13 DIAGNOSIS — F0281 Dementia in other diseases classified elsewhere with behavioral disturbance: Secondary | ICD-10-CM | POA: Diagnosis not present

## 2020-06-13 DIAGNOSIS — I441 Atrioventricular block, second degree: Secondary | ICD-10-CM | POA: Diagnosis not present

## 2020-06-13 DIAGNOSIS — G311 Senile degeneration of brain, not elsewhere classified: Secondary | ICD-10-CM | POA: Diagnosis not present

## 2020-06-13 DIAGNOSIS — I10 Essential (primary) hypertension: Secondary | ICD-10-CM | POA: Diagnosis not present

## 2020-06-13 NOTE — Progress Notes (Signed)
Remote pacemaker transmission.   

## 2020-06-14 DIAGNOSIS — I10 Essential (primary) hypertension: Secondary | ICD-10-CM | POA: Diagnosis not present

## 2020-06-14 DIAGNOSIS — I2510 Atherosclerotic heart disease of native coronary artery without angina pectoris: Secondary | ICD-10-CM | POA: Diagnosis not present

## 2020-06-14 DIAGNOSIS — G311 Senile degeneration of brain, not elsewhere classified: Secondary | ICD-10-CM | POA: Diagnosis not present

## 2020-06-14 DIAGNOSIS — F0281 Dementia in other diseases classified elsewhere with behavioral disturbance: Secondary | ICD-10-CM | POA: Diagnosis not present

## 2020-06-14 DIAGNOSIS — I441 Atrioventricular block, second degree: Secondary | ICD-10-CM | POA: Diagnosis not present

## 2020-06-14 DIAGNOSIS — R296 Repeated falls: Secondary | ICD-10-CM | POA: Diagnosis not present

## 2020-06-16 DIAGNOSIS — F0281 Dementia in other diseases classified elsewhere with behavioral disturbance: Secondary | ICD-10-CM | POA: Diagnosis not present

## 2020-06-16 DIAGNOSIS — R296 Repeated falls: Secondary | ICD-10-CM | POA: Diagnosis not present

## 2020-06-16 DIAGNOSIS — I10 Essential (primary) hypertension: Secondary | ICD-10-CM | POA: Diagnosis not present

## 2020-06-16 DIAGNOSIS — I2510 Atherosclerotic heart disease of native coronary artery without angina pectoris: Secondary | ICD-10-CM | POA: Diagnosis not present

## 2020-06-16 DIAGNOSIS — I441 Atrioventricular block, second degree: Secondary | ICD-10-CM | POA: Diagnosis not present

## 2020-06-16 DIAGNOSIS — G311 Senile degeneration of brain, not elsewhere classified: Secondary | ICD-10-CM | POA: Diagnosis not present

## 2020-06-18 ENCOUNTER — Other Ambulatory Visit: Payer: Self-pay | Admitting: Cardiovascular Disease

## 2020-06-21 DIAGNOSIS — I2510 Atherosclerotic heart disease of native coronary artery without angina pectoris: Secondary | ICD-10-CM | POA: Diagnosis not present

## 2020-06-21 DIAGNOSIS — I441 Atrioventricular block, second degree: Secondary | ICD-10-CM | POA: Diagnosis not present

## 2020-06-21 DIAGNOSIS — R296 Repeated falls: Secondary | ICD-10-CM | POA: Diagnosis not present

## 2020-06-21 DIAGNOSIS — I10 Essential (primary) hypertension: Secondary | ICD-10-CM | POA: Diagnosis not present

## 2020-06-21 DIAGNOSIS — G311 Senile degeneration of brain, not elsewhere classified: Secondary | ICD-10-CM | POA: Diagnosis not present

## 2020-06-21 DIAGNOSIS — F0281 Dementia in other diseases classified elsewhere with behavioral disturbance: Secondary | ICD-10-CM | POA: Diagnosis not present

## 2020-06-26 DIAGNOSIS — I10 Essential (primary) hypertension: Secondary | ICD-10-CM | POA: Diagnosis not present

## 2020-06-26 DIAGNOSIS — G311 Senile degeneration of brain, not elsewhere classified: Secondary | ICD-10-CM | POA: Diagnosis not present

## 2020-06-26 DIAGNOSIS — F0281 Dementia in other diseases classified elsewhere with behavioral disturbance: Secondary | ICD-10-CM | POA: Diagnosis not present

## 2020-06-26 DIAGNOSIS — I441 Atrioventricular block, second degree: Secondary | ICD-10-CM | POA: Diagnosis not present

## 2020-06-26 DIAGNOSIS — I2510 Atherosclerotic heart disease of native coronary artery without angina pectoris: Secondary | ICD-10-CM | POA: Diagnosis not present

## 2020-06-26 DIAGNOSIS — R296 Repeated falls: Secondary | ICD-10-CM | POA: Diagnosis not present

## 2020-06-28 DIAGNOSIS — I10 Essential (primary) hypertension: Secondary | ICD-10-CM | POA: Diagnosis not present

## 2020-06-28 DIAGNOSIS — I441 Atrioventricular block, second degree: Secondary | ICD-10-CM | POA: Diagnosis not present

## 2020-06-28 DIAGNOSIS — F0281 Dementia in other diseases classified elsewhere with behavioral disturbance: Secondary | ICD-10-CM | POA: Diagnosis not present

## 2020-06-28 DIAGNOSIS — I2510 Atherosclerotic heart disease of native coronary artery without angina pectoris: Secondary | ICD-10-CM | POA: Diagnosis not present

## 2020-06-28 DIAGNOSIS — R296 Repeated falls: Secondary | ICD-10-CM | POA: Diagnosis not present

## 2020-06-28 DIAGNOSIS — G311 Senile degeneration of brain, not elsewhere classified: Secondary | ICD-10-CM | POA: Diagnosis not present

## 2020-07-04 DIAGNOSIS — I441 Atrioventricular block, second degree: Secondary | ICD-10-CM | POA: Diagnosis not present

## 2020-07-04 DIAGNOSIS — I2510 Atherosclerotic heart disease of native coronary artery without angina pectoris: Secondary | ICD-10-CM | POA: Diagnosis not present

## 2020-07-04 DIAGNOSIS — I10 Essential (primary) hypertension: Secondary | ICD-10-CM | POA: Diagnosis not present

## 2020-07-04 DIAGNOSIS — F0281 Dementia in other diseases classified elsewhere with behavioral disturbance: Secondary | ICD-10-CM | POA: Diagnosis not present

## 2020-07-04 DIAGNOSIS — R296 Repeated falls: Secondary | ICD-10-CM | POA: Diagnosis not present

## 2020-07-04 DIAGNOSIS — G311 Senile degeneration of brain, not elsewhere classified: Secondary | ICD-10-CM | POA: Diagnosis not present

## 2020-07-05 DIAGNOSIS — I2510 Atherosclerotic heart disease of native coronary artery without angina pectoris: Secondary | ICD-10-CM | POA: Diagnosis not present

## 2020-07-05 DIAGNOSIS — F0281 Dementia in other diseases classified elsewhere with behavioral disturbance: Secondary | ICD-10-CM | POA: Diagnosis not present

## 2020-07-05 DIAGNOSIS — I10 Essential (primary) hypertension: Secondary | ICD-10-CM | POA: Diagnosis not present

## 2020-07-05 DIAGNOSIS — R296 Repeated falls: Secondary | ICD-10-CM | POA: Diagnosis not present

## 2020-07-05 DIAGNOSIS — G311 Senile degeneration of brain, not elsewhere classified: Secondary | ICD-10-CM | POA: Diagnosis not present

## 2020-07-05 DIAGNOSIS — I441 Atrioventricular block, second degree: Secondary | ICD-10-CM | POA: Diagnosis not present

## 2020-07-06 DIAGNOSIS — R296 Repeated falls: Secondary | ICD-10-CM | POA: Diagnosis not present

## 2020-07-06 DIAGNOSIS — I10 Essential (primary) hypertension: Secondary | ICD-10-CM | POA: Diagnosis not present

## 2020-07-06 DIAGNOSIS — F0281 Dementia in other diseases classified elsewhere with behavioral disturbance: Secondary | ICD-10-CM | POA: Diagnosis not present

## 2020-07-06 DIAGNOSIS — I441 Atrioventricular block, second degree: Secondary | ICD-10-CM | POA: Diagnosis not present

## 2020-07-06 DIAGNOSIS — G311 Senile degeneration of brain, not elsewhere classified: Secondary | ICD-10-CM | POA: Diagnosis not present

## 2020-07-06 DIAGNOSIS — I2510 Atherosclerotic heart disease of native coronary artery without angina pectoris: Secondary | ICD-10-CM | POA: Diagnosis not present

## 2020-07-21 NOTE — Telephone Encounter (Signed)
Spoke with [facility] in Admissions who stated their bed availability was very, very low and the family has to call [phone] and speak to someone in admissions- they will screen the person via phone and discuss cost and other issues with the family and if they are able to accept the patient the family will be given a packet to bring to the doctor with FL2

## 2020-07-21 NOTE — Telephone Encounter (Signed)
Nurses Please work with patient Please call [facility] to see if they are accepting admissions as an outpatient.  Please also try to help patient get set up with social worker  It is stressing to the family when these situations get like this but the likelihood of immediately placing Chad Martinez is very low  FL2 is needed as well

## 2020-07-26 NOTE — Telephone Encounter (Signed)
Wife advised: Spoke with [facility] in Admissions who stated their bed availability was very, very low and the family has to call [phone] and speak to someone in admissions- they will screen the person via phone and discuss cost and other issues with the family and if they are able to accept the patient the family will be given a packet to bring to the doctor with FL2 Wife notified and will call today to speak to admissions.

## 2020-07-26 NOTE — Telephone Encounter (Signed)
Nurses Please connect with family they need to go through the process I will touch base with the [facility] But I do not have the inside track to make this happen Family has to go through the process, of calling, talking to admissions, then if all is proceeding forward family or the [facility] can forward [place] a FL2 to fill out

## 2020-07-27 ENCOUNTER — Telehealth: Payer: Self-pay

## 2020-07-27 NOTE — Telephone Encounter (Signed)
Chad Martinez come by and dropped off a FL2 form saying that it was needed today cent to the [facility] form placed in Dr Lovena Le box in her office to be completed. FL2 was completed back late September for Chad Martinez.  Pt call back [phone]

## 2020-07-28 NOTE — Telephone Encounter (Signed)
done

## 2020-08-09 ENCOUNTER — Non-Acute Institutional Stay (SKILLED_NURSING_FACILITY): Payer: Medicare Other | Admitting: Adult Health

## 2020-08-09 ENCOUNTER — Encounter: Payer: Self-pay | Admitting: Adult Health

## 2020-08-09 ENCOUNTER — Other Ambulatory Visit: Payer: Self-pay | Admitting: Adult Health

## 2020-08-09 ENCOUNTER — Inpatient Hospital Stay
Admission: RE | Admit: 2020-08-09 | Discharge: 2020-10-06 | Disposition: E | Payer: Medicare Other | Source: Ambulatory Visit | Attending: Internal Medicine | Admitting: Internal Medicine

## 2020-08-09 ENCOUNTER — Telehealth: Payer: Self-pay | Admitting: *Deleted

## 2020-08-09 DIAGNOSIS — F0391 Unspecified dementia with behavioral disturbance: Secondary | ICD-10-CM

## 2020-08-09 DIAGNOSIS — F0390 Unspecified dementia without behavioral disturbance: Secondary | ICD-10-CM

## 2020-08-09 DIAGNOSIS — E1159 Type 2 diabetes mellitus with other circulatory complications: Secondary | ICD-10-CM | POA: Diagnosis not present

## 2020-08-09 DIAGNOSIS — N138 Other obstructive and reflux uropathy: Secondary | ICD-10-CM

## 2020-08-09 DIAGNOSIS — F03C0 Unspecified dementia, severe, without behavioral disturbance, psychotic disturbance, mood disturbance, and anxiety: Secondary | ICD-10-CM | POA: Insufficient documentation

## 2020-08-09 DIAGNOSIS — F32A Depression, unspecified: Secondary | ICD-10-CM

## 2020-08-09 DIAGNOSIS — I441 Atrioventricular block, second degree: Secondary | ICD-10-CM

## 2020-08-09 DIAGNOSIS — Z950 Presence of cardiac pacemaker: Secondary | ICD-10-CM | POA: Diagnosis not present

## 2020-08-09 DIAGNOSIS — F0393 Unspecified dementia, unspecified severity, with mood disturbance: Secondary | ICD-10-CM

## 2020-08-09 DIAGNOSIS — F03918 Unspecified dementia, unspecified severity, with other behavioral disturbance: Secondary | ICD-10-CM

## 2020-08-09 DIAGNOSIS — N401 Enlarged prostate with lower urinary tract symptoms: Secondary | ICD-10-CM

## 2020-08-09 DIAGNOSIS — F0280 Dementia in other diseases classified elsewhere without behavioral disturbance: Secondary | ICD-10-CM

## 2020-08-09 HISTORY — DX: Unspecified dementia with behavioral disturbance: F03.91

## 2020-08-09 HISTORY — DX: Unspecified dementia, unspecified severity, with other behavioral disturbance: F03.918

## 2020-08-09 MED ORDER — LORAZEPAM 0.5 MG PO TABS: 0.5000 mg | ORAL_TABLET | Freq: Every day | ORAL | 0 refills | Status: AC

## 2020-08-09 NOTE — Progress Notes (Signed)
Location:  [facility] Room Number: 139/W Place of Service:  SNF (31)   CODE STATUS: DNR  Allergies  Allergen Reactions  . Bactrim [Sulfamethoxazole-Trimethoprim] Rash    Chief Complaint  Patient presents with  . Follow-up    Follow Up Transfer    HPI:  He is a 86 year old man who had been in another SNF; he had 3 falls at the facility; so her family took him home. They are unable to manage his adls at home and requires long term placement. His family states that he does have hallucinations which are disturbing to him and he can become agitated at times. Marland Kitchen. He did deny any pain. His family stated that he has a good appetite. This is a long term placement for him. 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 Colon polyps   . Coronary artery disease    s/p CABG x 6  . Detached retina    Partial  . Elevated PSA   . Enlarged prostate   . Hyperlipidemia   . Hypertension   . Pacemaker   . Pneumothorax 12/10/2011   right rib fx after fall from ladder  . Reflux   . Syncope    Permanent Pacemaker 12/11/2011 MDT Adapta    Past Surgical History:  Procedure Laterality Date  . BYPASS GRAFT  10/08   LIMA to LAD,SVG to 2nd & 3rd obtuse marginal CX,SVG to PDA,1st posterolateral and 2nd posterolateral RCA  . CARDIAC SURGERY    . COLONOSCOPY N/A 01/25/2013   Procedure: COLONOSCOPY;  Surgeon: Corbin Adeobert M Rourk, MD;  [facility];  Service: Endoscopy;  Laterality: N/A;  8:30 Am  . CORONARY ARTERY BYPASS GRAFT  04/19/2012  . EYE SURGERY  2004  . HEMORROIDECTOMY    . NM MYOVIEW LTD  11/05/2010   no ischemia  . PERMANENT PACEMAKER INSERTION  12/11/2011   MDT Adapta  . PERMANENT PACEMAKER INSERTION N/A 12/11/2011   Procedure: PERMANENT PACEMAKER  INSERTION;  Surgeon: Thurmon FairMihai Croitoru, MD;  Location: [facility] CATH LAB;  Service: Cardiovascular;  Laterality: N/A;  . SKIN GRAFT  2012  . US ECHOCARDIOGRAPHY  10/14/2007   mild mitral annular ca+,EF 55%,AOV mildly sclerotic    Social History   Socioeconomic History  . Marital status: Married    Spouse name: Not on file  . Number of children: 4  . Years of education: 2212  . Highest education level: Some college, no degree  Occupational History  . Occupation: retired  Tobacco Use  . Smoking status: Former Smoker    Packs/day: 3.00    Years: 20.00    Pack years: 60.00    Quit date: 12/19/1974    Years since quitting: 45.6  . Smokeless tobacco: Never Used  Vaping Use  . Vaping Use: Never used  Substance and Sexual Activity  . Alcohol use: No  . Drug use: No  . Sexual activity: Not Currently  Other Topics Concern  . Not on file  Social History Narrative   Right handed    Lives in a one story home    Social Determinants of Health   Financial Resource Strain: Not on file  Food Insecurity: Not on file  Transportation Needs: Not on file  Physical Activity: Not on file  Stress: Not on file  Social Connections: Not on file  Intimate Partner Violence: Not on file   Family History  Problem Relation Age of Onset  . Pneumonia Mother   . Multiple sclerosis Father   . Lung cancer Brother       VITAL SIGNS BP 132/90   Pulse 79   Temp 98.3 F (36.8 C)   Resp 20   Ht 5\' 7"  (1.702 m)   Wt 190 lb 0.6 oz (86.2 kg)   SpO2 95%   BMI 29.76 kg/m   Outpatient Encounter Medications as of 08/21/2020  Medication Sig  . acetaminophen (TYLENOL) 325 MG tablet Take 650 mg by mouth every 6 (six) hours as needed.  . divalproex (DEPAKOTE) 250 MG DR tablet Take 250 mg by mouth 2 (two) times daily.  Marland Kitchen escitalopram (LEXAPRO) 20 MG tablet Take 1 tablet in AM, 1/2 tablet at night  . finasteride (PROSCAR) 5 MG tablet Take 5 mg by mouth in the morning.   Marland Kitchen LORazepam (ATIVAN) 0.5 MG tablet Take 0.5 mg  by mouth at bedtime.  . melatonin 3 MG TABS tablet Take 6 mg by mouth at bedtime.  . NON FORMULARY Diet: Mechanical soft  . QUEtiapine (SEROQUEL) 25 MG tablet Take 25 mg by mouth at bedtime.  . tamsulosin (FLOMAX) 0.4 MG CAPS capsule Take 1 capsule (0.4 mg total) by mouth daily.   No facility-administered encounter medications on file as of 09/04/2020.     SIGNIFICANT DIAGNOSTIC EXAMS  None recent    Review of Systems  Unable to perform ROS: Dementia (unable to participate )   Physical Exam Constitutional:      General: He is not in acute distress.    Appearance: He is well-developed and well-nourished. He is not diaphoretic.  Neck:     Thyroid: No thyromegaly.  Cardiovascular:     Rate and Rhythm: Normal rate and regular rhythm.     Pulses: Normal pulses and intact distal pulses.     Heart sounds: Normal heart sounds.  Pulmonary:     Effort: Pulmonary effort is normal. No respiratory distress.     Breath sounds: Normal breath sounds.  Abdominal:     General: Bowel sounds are normal. There is no distension.     Palpations: Abdomen is soft.     Tenderness: There is no abdominal tenderness.  Musculoskeletal:        General: Normal range of motion.     Cervical back: Neck supple.     Right lower leg: No edema.     Left lower leg: No edema.  Lymphadenopathy:     Cervical: No cervical adenopathy.  Skin:    General: Skin is warm and dry.  Neurological:     Mental Status: He is alert. Mental status is at baseline.  Psychiatric:        Mood and Affect: Mood and affect and mood normal.       ASSESSMENT/ PLAN:  TODAY  1.  Mobitz type 2 second degree av block: status post pacemaker: is stable will monitor  2. Advanced dementia: is without change weight is 190 pounds  3. 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 Psychosis in elderly with behavioral  disturbance: does have hallucinations: is stable will continue seroquel 25 mg nightly and will continue depakote 250 mg twice daily to stabilize mood.   5. Depression due to dementia: is  stable will continue lexapro 20 mg in AM and 10 mg in PM. Takes ativan 0.5 mg nightly and melatonin 6 mg nightly for sleep.   6. Benign prostatic hypertrophy with urinary obstruction: is stable will continue proscar 5 mg daily and flomax 0.4 mg daily   Will check cb; cmp; depakote level and hgb a1c   MD is aware of resident's narcotic use and is in agreement with current plan of care. We will attempt to wean resident as appropriate.  Ok Edwards NP [facility]  Contact [phone] Monday through Friday 8am- 5pm  After hours call [phone]

## 2020-08-09 NOTE — Telephone Encounter (Signed)
Santiago Glad from [facility] calling to clarify meds on fl2. All meds were different from what was on his med list. I printed a copy of his fl2 dated 07/27/20. She states the one she has is dated 1/18. She is going to fax [place] a copy to see if this was the correct information since med list did not match. And we need to fax correct fl2 on pt to fax number [phone]

## 2020-08-09 NOTE — Telephone Encounter (Signed)
FL2 faxed from [facility] dated 07/25/2020- it was not from a physician at our office. [facility] notified and will let us know if they need anything

## 2020-08-10 ENCOUNTER — Other Ambulatory Visit (HOSPITAL_COMMUNITY)
Admission: RE | Admit: 2020-08-10 | Discharge: 2020-08-10 | Disposition: A | Discharge status: Home / Self Care | Payer: Medicare Other | Source: Skilled Nursing Facility | Attending: Adult Health | Admitting: Adult Health

## 2020-08-10 DIAGNOSIS — E119 Type 2 diabetes mellitus without complications: Secondary | ICD-10-CM | POA: Insufficient documentation

## 2020-08-10 DIAGNOSIS — I2510 Atherosclerotic heart disease of native coronary artery without angina pectoris: Secondary | ICD-10-CM | POA: Diagnosis present

## 2020-08-10 LAB — COMPREHENSIVE METABOLIC PANEL
ALT: 23 U/L (ref 0–44)
AST: 24 U/L (ref 15–41)
Albumin: 3.5 g/dL (ref 3.5–5.0)
Alkaline Phosphatase: 57 U/L (ref 38–126)
Anion gap: 8 (ref 5–15)
BUN: 24 mg/dL — ABNORMAL HIGH (ref 8–23)
CO2: 28 mmol/L (ref 22–32)
Calcium: 8.8 mg/dL — ABNORMAL LOW (ref 8.9–10.3)
Chloride: 106 mmol/L (ref 98–111)
Creatinine, Ser: 0.92 mg/dL (ref 0.61–1.24)
GFR, Estimated: >60 mL/min (ref >60)
Glucose, Bld: 130 mg/dL — ABNORMAL HIGH (ref 70–99)
Potassium: 3.7 mmol/L (ref 3.5–5.1)
Sodium: 142 mmol/L (ref 135–145)
Total Bilirubin: 1.3 mg/dL — ABNORMAL HIGH (ref 0.3–1.2)
Total Protein: 6.7 g/dL (ref 6.5–8.1)

## 2020-08-10 LAB — CBC
HCT: 45.1 % (ref 39.0–52.0)
Hemoglobin: 14.3 g/dL (ref 13.0–17.0)
MCH: 29 pg (ref 26.0–34.0)
MCHC: 31.7 g/dL (ref 30.0–36.0)
MCV: 91.5 fL (ref 80.0–100.0)
Platelets: 155 10*3/uL (ref 150–400)
RBC: 4.93 MIL/uL (ref 4.22–5.81)
RDW: 13.4 % (ref 11.5–15.5)
WBC: 6.7 10*3/uL (ref 4.0–10.5)
nRBC: 0 % (ref 0.0–0.2)

## 2020-08-10 LAB — HEMOGLOBIN A1C
Hgb A1c MFr Bld: 5.7 % — ABNORMAL HIGH (ref 4.8–5.6)
Mean Plasma Glucose: 116.89 mg/dL

## 2020-08-10 LAB — VALPROIC ACID LEVEL: Valproic Acid Lvl: 13 ug/mL — ABNORMAL LOW (ref 50.0–100.0)

## 2020-08-11 ENCOUNTER — Encounter: Payer: Self-pay | Admitting: Internal Medicine

## 2020-08-11 ENCOUNTER — Non-Acute Institutional Stay (SKILLED_NURSING_FACILITY): Payer: Medicare Other | Admitting: Internal Medicine

## 2020-08-11 DIAGNOSIS — E1159 Type 2 diabetes mellitus with other circulatory complications: Secondary | ICD-10-CM | POA: Diagnosis not present

## 2020-08-11 DIAGNOSIS — F0390 Unspecified dementia without behavioral disturbance: Secondary | ICD-10-CM | POA: Diagnosis not present

## 2020-08-11 DIAGNOSIS — I10 Essential (primary) hypertension: Secondary | ICD-10-CM

## 2020-08-11 DIAGNOSIS — F03918 Unspecified dementia, unspecified severity, with other behavioral disturbance: Secondary | ICD-10-CM

## 2020-08-11 DIAGNOSIS — F0391 Unspecified dementia with behavioral disturbance: Secondary | ICD-10-CM | POA: Diagnosis not present

## 2020-08-11 DIAGNOSIS — F03C0 Unspecified dementia, severe, without behavioral disturbance, psychotic disturbance, mood disturbance, and anxiety: Secondary | ICD-10-CM

## 2020-08-11 NOTE — Assessment & Plan Note (Signed)
Speech indiscernable & confabulational. Unable to follow commands . Constantly trying to get out of bed. Plan: adjust psychotropic meds as ongoing observations dictate. Lower bed as much as possible.

## 2020-08-11 NOTE — Assessment & Plan Note (Signed)
Epic review reveals that his B12 level was low normal at 347 in March 2019. TSH was 2.67 in September 2020. Consideration could be given to updating these labs as well as checking a VDRL/RPR.

## 2020-08-11 NOTE — Assessment & Plan Note (Signed)
Elevated BP in context of dementia with agitation. Average BP will dictate change in meds

## 2020-08-11 NOTE — Assessment & Plan Note (Addendum)
Glucose 130 but A1c non diabetic @ 5.3%. No change indicated.

## 2020-08-11 NOTE — Patient Instructions (Signed)
See assessment and plan under each diagnosis in the problem list and acutely for this visit 

## 2020-08-11 NOTE — Progress Notes (Signed)
   NURSING HOME LOCATION: [place] ROOM NUMBER: 139/W   CODE STATUS:DNR    PCP: Erven Colla, DO   This is a comprehensive admission note to [facility] performed on this date less than 30 days from date of admission. Included are preadmission medical/surgical history; reconciled medication list; family history; social history and comprehensive review of systems.  Corrections and additions to the records were documented. Comprehensive physical exam was also performed. Additionally a clinical summary was entered for each active diagnosis pertinent to this admission in the Problem List to enhance continuity of care.  HPI: He had previously been in a memory care unit until he had 3 falls in October 2021.  He was taken home with Hospice but now has been admitted to this facility as a long-term care resident.  Hospice is to be consulted here.  Past medical and surgical history: Includes history of syncope for which pacemaker was inserted; GERD; essential hypertension; dyslipidemia; history colon polyps; CAD; and BPH. In addition to pacemaker placement he has had CABG x6.  Social history: Nondrinker; former smoker.Chart states he smoked up to 3 packs a day for 2 decades, but his wife states that it was much less.  Family history: History reviewed; it is noncontributory due to his advanced age.   Review of systems:  Could not be completed due to dementia.  His wife of 95 years was the historian.  His speech is garbled and essentially undiscernible. She states that his dementia began approximately 3 years ago and has been progressive. Her major concern at this time is that he keeps trying to get out of bed without help.  Physical exam:  Pertinent or positive findings: Pattern alopecia is present.  He stares blankly and cannot follow commands.  As noted,speech is garbled and unintelligible.  Ptosis is greater in the left eye than the right.  The corner of the left mouth sags  and associated with an inferior crease on that side.  Heart sounds are somewhat distant.  Chest was surprisingly clear.  Dorsalis pedis pulses are stronger than posterior tibial pulses.  There is clawing of the toes.  The left great toenail was thickened and pale.  He has extensive bruising over the forearms.  General appearance: Adequately nourished; no acute distress, increased work of breathing is present.   Lymphatic: No lymphadenopathy about the head, neck, axilla. Eyes: No conjunctival inflammation or lid edema is present. There is no scleral icterus. Ears:  External ear exam shows no significant lesions or deformities.   Nose:  External nasal examination shows no deformity or inflammation. Nasal mucosa are pink and moist without lesions, exudates Oral exam: Lips and gums are healthy appearing.There is no oropharyngeal erythema or exudate. Neck:  No thyromegaly, masses, tenderness noted.    Heart:  No gallop, murmur, click, rub.  Lungs:  without wheezes, rhonchi, rales, rubs. Abdomen: Bowel sounds are normal.  Abdomen is soft and nontender with no organomegaly, hernias, masses. GU: Deferred  Extremities:  No cyanosis, clubbing, edema. Neurologic exam: Balance, Rhomberg, finger to nose testing could not be completed due to clinical state Skin: Warm & dry w/o tenting. No significant rash.  See clinical summary under each active problem in the Problem List with associated updated therapeutic plan

## 2020-08-16 ENCOUNTER — Non-Acute Institutional Stay (SKILLED_NURSING_FACILITY): Payer: Medicare Other | Admitting: Adult Health

## 2020-08-16 ENCOUNTER — Encounter: Payer: Self-pay | Admitting: Adult Health

## 2020-08-16 DIAGNOSIS — I441 Atrioventricular block, second degree: Secondary | ICD-10-CM

## 2020-08-16 DIAGNOSIS — F03C0 Unspecified dementia, severe, without behavioral disturbance, psychotic disturbance, mood disturbance, and anxiety: Secondary | ICD-10-CM

## 2020-08-16 DIAGNOSIS — E1159 Type 2 diabetes mellitus with other circulatory complications: Secondary | ICD-10-CM | POA: Diagnosis not present

## 2020-08-16 DIAGNOSIS — F0390 Unspecified dementia without behavioral disturbance: Secondary | ICD-10-CM

## 2020-08-16 DIAGNOSIS — I700 Atherosclerosis of aorta: Secondary | ICD-10-CM | POA: Insufficient documentation

## 2020-08-16 NOTE — Progress Notes (Signed)
Location:  [place] Room Number: 139/W Place of Service:  SNF (31)   CODE STATUS: DNR  Allergies  Allergen Reactions  . Bactrim [Sulfamethoxazole-Trimethoprim] Rash    Chief Complaint  Patient presents with  . 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 He continues to be followed by hospice care. There are no reports of agitation; no reports of changes in appetite; no reports of constipation.   Past Medical History:  Diagnosis Date  . Colon polyps   . Coronary artery disease    s/p CABG x 6  . Detached retina    Partial  . Elevated PSA   . Enlarged prostate   . Hyperlipidemia   . Hypertension   . Impingement syndrome of left shoulder 10/10/2016  . Pacemaker   . Pneumothorax 12/10/2011   right rib fx after fall from ladder  . Reflux   . Retinal detachment 10/11/2011  . Syncope    Permanent Pacemaker 12/11/2011 MDT Adapta    Past Surgical History:  Procedure Laterality Date  . BYPASS GRAFT  10/08   LIMA to LAD,SVG to 2nd & 3rd obtuse marginal CX,SVG to PDA,1st posterolateral and 2nd posterolateral RCA  . CARDIAC SURGERY    . COLONOSCOPY N/A 01/25/2013   Procedure: COLONOSCOPY;  Surgeon: Daneil Dolin, MD;  [facility];  Service: Endoscopy;  Laterality: N/A;  8:30 Am  . CORONARY ARTERY BYPASS GRAFT  04/19/2012  . EYE SURGERY  2004  . HEMORROIDECTOMY    . NM MYOVIEW LTD  11/05/2010   no  ischemia  . PERMANENT PACEMAKER INSERTION  12/11/2011   MDT Adapta  . PERMANENT PACEMAKER INSERTION N/A 12/11/2011   Procedure: PERMANENT PACEMAKER INSERTION;  Surgeon: Sanda Klein, MD;  Location: [place] CATH LAB;  Service: Cardiovascular;  Laterality: N/A;  . SKIN GRAFT  2012  . US ECHOCARDIOGRAPHY  10/14/2007   mild mitral annular ca+,EF 55%,AOV mildly sclerotic    Social History   Socioeconomic History  . Marital status: Married    Spouse name: Not on file  . Number of children: 4  . Years of education: 26  . Highest education level: Some college, no degree  Occupational History  . Occupation: retired  Tobacco Use  . Smoking status: Former Smoker    Packs/day: 3.00    Years: 20.00    Pack years: 60.00    Quit date: 12/19/1974    Years since quitting: 45.6  . Smokeless tobacco: Never Used  Vaping Use  . Vaping Use: Never used  Substance and Sexual Activity  . Alcohol use: No  . Drug use: No  . Sexual activity: Not Currently  Other Topics Concern  . Not on file  Social History Narrative   Right handed    Lives in a one story home    Social Determinants of Health   Financial  Resource Strain: Not on file  Food Insecurity: Not on file  Transportation Needs: Not on file  Physical Activity: Not on file  Stress: Not on file  Social Connections: Not on file  Intimate Partner Violence: Not on file   Family History  Problem Relation Age of Onset  . Pneumonia Mother   . Multiple sclerosis Father   . Lung cancer Brother       VITAL SIGNS BP 134/70   Pulse 70   Temp 97.8 F (36.6 C)   Resp 20   Ht 5\' 7"  (1.702 m)   Wt 160 lb (72.6 kg)   SpO2 96%   BMI 25.06 kg/m   Outpatient Encounter Medications as of 08/16/2020  Medication Sig  . acetaminophen (TYLENOL) 325 MG tablet Take 650 mg by mouth every 6 (six) hours as needed.  . Amino Acids-Protein Hydrolys (PRO-STAT 64 PO) Take 30 mLs by mouth 3 (three) times daily with meals.  . divalproex (DEPAKOTE) 250 MG DR tablet  Take 250 mg by mouth 2 (two) times daily.  Marland Kitchen escitalopram (LEXAPRO) 20 MG tablet Take 1 tablet in AM, 1/2 tablet at night  . finasteride (PROSCAR) 5 MG tablet Take 5 mg by mouth in the morning.   Marland Kitchen LORazepam (ATIVAN) 0.5 MG tablet Take 1 tablet (0.5 mg total) by mouth at bedtime.  . melatonin 3 MG TABS tablet Take 6 mg by mouth at bedtime.  . NON FORMULARY Diet: Mechanical soft Special Instructions: no dairy products  (Allergy)  . QUEtiapine (SEROQUEL) 25 MG tablet Take 25 mg by mouth at bedtime.  . tamsulosin (FLOMAX) 0.4 MG CAPS capsule Take 1 capsule (0.4 mg total) by mouth daily.  . [DISCONTINUED] feeding supplement (ENSURE ENLIVE / ENSURE PLUS) LIQD Take 237 mLs by mouth 2 (two) times daily between meals.   No facility-administered encounter medications on file as of 08/16/2020.     SIGNIFICANT DIAGNOSTIC EXAMS  LABS REVIEWED TODAY:  08-10-20: wbc 6.7; hgb 14.3; hct 45.1 mcv 91.5 plt 155; glucose 130; bun 24; creat 0.92; k+ 3.7; na++ 142; ca 8.8 GFR >60; total bili 1.3 albumin 3.5    Review of Systems  Unable to perform ROS: Dementia (unable to participate )    Physical Exam Constitutional:      General: He is not in acute distress.    Appearance: He is well-developed and well-nourished. He is not diaphoretic.  Neck:     Thyroid: No thyromegaly.  Cardiovascular:     Rate and Rhythm: Normal rate and regular rhythm.     Pulses: Normal pulses and intact distal pulses.     Heart sounds: Normal heart sounds.  Pulmonary:     Effort: Pulmonary effort is normal. No respiratory distress.     Breath sounds: Normal breath sounds.  Abdominal:     General: Bowel sounds are normal. There is no distension.     Palpations: Abdomen is soft.     Tenderness: There is no abdominal tenderness.  Musculoskeletal:        General: No edema. Normal range of motion.     Cervical back: Neck supple.     Right lower leg: No edema.     Left lower leg: No edema.  Lymphadenopathy:     Cervical: No  cervical adenopathy.  Skin:    General: Skin is warm and dry.  Neurological:     Mental Status: He is alert. Mental status is at baseline.  Psychiatric:        Mood  and Affect: Mood and affect and mood normal.      ASSESSMENT/ PLAN:  TODAY  1. Aortic atherosclerosis: stable (ct 12-10-11) will monitor  2. 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 Advanced dementia: is without change weight is 160 pounds   4. Type 2 diabetes mellitus with other circulatory complication without long term current use of insulin: is stable hg a1c 5.7 will monitor     PREVIOUS   5. Psychosis in elderly with behavioral disturbance: does have hallucinations: is stable will continue seroquel 25 mg nightly and will continue depakote 250 mg twice daily to stabilize mood.   6. Depression due to dementia: is stable will continue lexapro 20 mg in AM and 10 mg in PM. Takes ativan 0.5 mg nightly and melatonin 6 mg nightly for sleep.   7. Benign prostatic hypertrophy with urinary obstruction: is stable will continue proscar 5 mg daily and flomax 0.4 mg daily      MD is aware of resident's narcotic use and is in agreement with current plan of care. We will attempt to wean resident as appropriate.  Ok Edwards NP [facility]  Contact [phone] Monday through Friday 8am- 5pm  After hours call [phone]

## 2020-08-22 ENCOUNTER — Encounter: Payer: Self-pay | Admitting: Adult Health

## 2020-08-22 ENCOUNTER — Non-Acute Institutional Stay (SKILLED_NURSING_FACILITY): Payer: Medicare Other | Admitting: Adult Health

## 2020-08-22 DIAGNOSIS — N401 Enlarged prostate with lower urinary tract symptoms: Secondary | ICD-10-CM

## 2020-08-22 DIAGNOSIS — F32A Depression, unspecified: Secondary | ICD-10-CM

## 2020-08-22 DIAGNOSIS — F0280 Dementia in other diseases classified elsewhere without behavioral disturbance: Secondary | ICD-10-CM | POA: Diagnosis not present

## 2020-08-22 DIAGNOSIS — F0393 Unspecified dementia, unspecified severity, with mood disturbance: Secondary | ICD-10-CM

## 2020-08-22 DIAGNOSIS — N138 Other obstructive and reflux uropathy: Secondary | ICD-10-CM | POA: Diagnosis not present

## 2020-08-22 NOTE — Progress Notes (Signed)
Location:  [facility] Room Number: 139-W Place of Service:  SNF (31)   CODE STATUS: DNR  Allergies  Allergen Reactions  . Bactrim [Sulfamethoxazole-Trimethoprim] Rash    Chief Complaint  Patient presents with  . Medical Management of Chronic Issues       Psychosis in elderly with behavioral disturbance:    Depression due to dementia   Benign prostatic hypertrophy with urinary obstruction    weekly follow up for the first 30 days post hospitalization.     HPI:  He is a 86 year old long term resident of this facility being seen for the management of his chronic illnesses:Psychosis in elderly with behavioral disturbance:    Depression due to dementia   Benign prostatic hypertrophy with urinary obstruction    He does become agitated with nursing care; there are no reports of changes in appetite; no reports of insomnia.   Past Medical History:  Diagnosis Date  . Colon polyps   . Coronary artery disease    s/p CABG x 6  . Detached retina    Partial  . Elevated PSA   . Enlarged prostate   . Hyperlipidemia   . Hypertension   . Impingement syndrome of left shoulder 10/10/2016  . Pacemaker   . Pneumothorax 12/10/2011   right rib fx after fall from ladder  . Reflux   . Retinal detachment 10/11/2011  . Syncope    Permanent Pacemaker 12/11/2011 MDT Adapta    Past Surgical History:  Procedure Laterality Date  . BYPASS GRAFT  10/08   LIMA to LAD,SVG to 2nd & 3rd obtuse marginal CX,SVG to PDA,1st posterolateral and 2nd posterolateral RCA  . CARDIAC SURGERY    . COLONOSCOPY N/A 01/25/2013   Procedure: COLONOSCOPY;  Surgeon: Daneil Dolin, MD;  [facility];  Service: Endoscopy;  Laterality: N/A;  8:30 Am  . CORONARY ARTERY BYPASS GRAFT  04/19/2012  . EYE SURGERY  2004  . HEMORROIDECTOMY    . NM MYOVIEW LTD  11/05/2010   no ischemia  . PERMANENT PACEMAKER INSERTION  12/11/2011   MDT Adapta  . PERMANENT PACEMAKER INSERTION N/A 12/11/2011   Procedure:  PERMANENT PACEMAKER INSERTION;  Surgeon: Sanda Klein, MD;  Location: [facility] CATH LAB;  Service: Cardiovascular;  Laterality: N/A;  . SKIN GRAFT  2012  . US ECHOCARDIOGRAPHY  10/14/2007   mild mitral annular ca+,EF 55%,AOV mildly sclerotic    Social History   Socioeconomic History  . Marital status: Married    Spouse name: Not on file  . Number of children: 4  . Years of education: 20  . Highest education level: Some college, no degree  Occupational History  . Occupation: retired  Tobacco Use  . Smoking status: Former Smoker    Packs/day: 3.00    Years: 20.00    Pack years: 60.00    Quit date: 12/19/1974    Years since quitting: 45.7  . Smokeless tobacco: Never Used  Vaping Use  . Vaping Use: Never used  Substance and Sexual Activity  . Alcohol use: No  . Drug use: No  . Sexual activity: Not Currently  Other Topics Concern  . Not on file  Social History Narrative   Right handed    Lives in a one story home    Social Determinants of Health   Financial Resource Strain: Not on file  Food Insecurity: Not on file  Transportation Needs: Not on file  Physical Activity: Not on file  Stress: Not  on file  Social Connections: Not on file  Intimate Partner Violence: Not on file   Family History  Problem Relation Age of Onset  . Pneumonia Mother   . Multiple sclerosis Father   . Lung cancer Brother       VITAL SIGNS BP 135/75   Pulse 92   Temp (!) 97 F (36.1 C)   Resp 20   Ht 5\' 7"  (1.702 m)   Wt 160 lb (72.6 kg)   SpO2 100%   BMI 25.06 kg/m   Outpatient Encounter Medications as of 08/22/2020  Medication Sig  . acetaminophen (TYLENOL) 325 MG tablet Take 650 mg by mouth every 6 (six) hours as needed.  . Amino Acids-Protein Hydrolys (PRO-STAT 64 PO) Take 30 mLs by mouth 3 (three) times daily with meals.  . divalproex (DEPAKOTE) 250 MG DR tablet Take 250 mg by mouth 2 (two) times daily.  Marland Kitchen escitalopram (LEXAPRO) 20 MG tablet Take 1 tablet in AM, 1/2 tablet at night   . feeding supplement (ENSURE ENLIVE / ENSURE PLUS) LIQD Take 237 mLs by mouth 3 (three) times daily with meals.  . finasteride (PROSCAR) 5 MG tablet Take 5 mg by mouth in the morning.   Marland Kitchen LORazepam (ATIVAN) 0.5 MG tablet Take 1 tablet (0.5 mg total) by mouth at bedtime.  . melatonin 3 MG TABS tablet Take 6 mg by mouth at bedtime.  . NON FORMULARY Diet: Mechanical soft Special Instructions: no dairy products  (Allergy)  . QUEtiapine (SEROQUEL) 25 MG tablet Take 25 mg by mouth at bedtime.  . tamsulosin (FLOMAX) 0.4 MG CAPS capsule Take 1 capsule (0.4 mg total) by mouth daily.   No facility-administered encounter medications on file as of 08/22/2020.     SIGNIFICANT DIAGNOSTIC EXAMS   LABS REVIEWED PREVIOUS   08-10-20: wbc 6.7; hgb 14.3; hct 45.1 mcv 91.5 plt 155; glucose 130; bun 24; creat 0.92; k+ 3.7; na++ 142; ca 8.8 GFR >60; total bili 1.3 albumin 3.5 depakote 10   NO NEW LABS.   Review of Systems  Unable to perform ROS: Dementia (unable to participate )    Physical Exam Constitutional:      General: He is not in acute distress.    Appearance: He is well-developed and well-nourished. He is not diaphoretic.  Neck:     Thyroid: No thyromegaly.  Cardiovascular:     Rate and Rhythm: Normal rate and regular rhythm.     Pulses: Normal pulses and intact distal pulses.     Heart sounds: Normal heart sounds.     Comments: Pace maker  Pulmonary:     Effort: Pulmonary effort is normal. No respiratory distress.     Breath sounds: Normal breath sounds.  Abdominal:     General: Bowel sounds are normal. There is no distension.     Palpations: Abdomen is soft.     Tenderness: There is no abdominal tenderness.  Musculoskeletal:        General: No edema. Normal range of motion.     Cervical back: Neck supple.     Right lower leg: No edema.     Left lower leg: No edema.  Lymphadenopathy:     Cervical: No cervical adenopathy.  Skin:    General: Skin is warm and dry.  Psychiatric:         Mood and Affect: Mood and affect normal.        Speech: He is noncommunicative.    ASSESSMENT/ PLAN:  TODAY  1. Psychosis  in elderly with behavioral disturbance: does have hallucinations; is unable to cooperative with care givers; will continue seroquel 25 mg nightly and depakote 250 mg twice daily to stabilize mood.   2. Depression due to dementia is stable will continue lexapro 20 mg in the AM and 10 mg in the PM; takes ativan 0.5 mg nightly and melatonin 6 mg nightly for sleep  3. Benign prostatic hypertrophy with urinary obstruction: is stable will continue proscar 5 mg daily and flomax 0.4 mg daily     PREVIOUS   4. Aortic atherosclerosis: stable (ct 12-10-11) will monitor  5. Mobitz type 2 secondary degree av block: status post pace maker will monitor; is stable   6. Advanced dementia: is without change weight is 160 pounds   7. Type 2 diabetes mellitus with other circulatory complication without long term current use of insulin: is stable hg a1c 5.7 will monitor     MD is aware of resident's narcotic use and is in agreement with current plan of care. We will attempt to wean resident as appropriate.  Ok Edwards NP [facility]  Contact [phone] Monday through Friday 8am- 5pm  After hours call [phone]

## 2020-08-24 ENCOUNTER — Encounter: Payer: Self-pay | Admitting: Adult Health

## 2020-08-24 ENCOUNTER — Non-Acute Institutional Stay (SKILLED_NURSING_FACILITY): Payer: Medicare Other | Admitting: Adult Health

## 2020-08-24 DIAGNOSIS — F0390 Unspecified dementia without behavioral disturbance: Secondary | ICD-10-CM | POA: Diagnosis not present

## 2020-08-24 DIAGNOSIS — F03918 Unspecified dementia, unspecified severity, with other behavioral disturbance: Secondary | ICD-10-CM

## 2020-08-24 DIAGNOSIS — Z950 Presence of cardiac pacemaker: Secondary | ICD-10-CM

## 2020-08-24 DIAGNOSIS — I700 Atherosclerosis of aorta: Secondary | ICD-10-CM | POA: Diagnosis not present

## 2020-08-24 DIAGNOSIS — F0391 Unspecified dementia with behavioral disturbance: Secondary | ICD-10-CM | POA: Diagnosis not present

## 2020-08-24 DIAGNOSIS — F03C0 Unspecified dementia, severe, without behavioral disturbance, psychotic disturbance, mood disturbance, and anxiety: Secondary | ICD-10-CM

## 2020-08-24 NOTE — Progress Notes (Signed)
Location:  [facility] Room Number: 139/W Place of Service:  SNF (31)   CODE STATUS: DNR  Allergies  Allergen Reactions  . Bactrim [Sulfamethoxazole-Trimethoprim] Rash    Chief Complaint  Patient presents with  . Acute Visit    Care Plan Meeting     HPI:  We have come together for his care plan meeting. Family present, he is followed by hospice care. No BIMS; mood 1/30. He is dependent upon staff for adls. He requires staff assistance with meals. Is incontinent of bladder and bowel; is nonambulatory. Has had no falls since admission to this facility. His weight is 160 pounds for a 30 pound weight loss since Oct 2021. Has poor intake. There are no indications of pain; he continues to be combative with staff during nursing care. He will continue to be followed for his chronic illnesses including: Advanced dementia  Aortic atherosclerosis  Psychosis in elderly with behavioral disturbance.   Past Medical History:  Diagnosis Date  . Colon polyps   . Coronary artery disease    s/p CABG x 6  . Detached retina    Partial  . Elevated PSA   . Enlarged prostate   . Hyperlipidemia   . Hypertension   . Impingement syndrome of left shoulder 10/10/2016  . Pacemaker   . Pneumothorax 12/10/2011   right rib fx after fall from ladder  . Psychosis in elderly with behavioral disturbance ([place]) 08/20/2020  . Reflux   . Retinal detachment 10/11/2011  . Syncope    Permanent Pacemaker 12/11/2011 MDT Adapta    Past Surgical History:  Procedure Laterality Date  . BYPASS GRAFT  10/08   LIMA to LAD,SVG to 2nd & 3rd obtuse marginal CX,SVG to PDA,1st posterolateral and 2nd posterolateral RCA  . CARDIAC SURGERY    . COLONOSCOPY N/A 01/25/2013   Procedure: COLONOSCOPY;  Surgeon: Daneil Dolin, MD;  [facility];  Service: Endoscopy;  Laterality: N/A;  8:30 Am  . CORONARY ARTERY BYPASS GRAFT  04/19/2012  . EYE SURGERY  2004  . HEMORROIDECTOMY    . NM MYOVIEW LTD  11/05/2010    no ischemia  . PERMANENT PACEMAKER INSERTION  12/11/2011   MDT Adapta  . PERMANENT PACEMAKER INSERTION N/A 12/11/2011   Procedure: PERMANENT PACEMAKER INSERTION;  Surgeon: Sanda Klein, MD;  Location: [facility] CATH LAB;  Service: Cardiovascular;  Laterality: N/A;  . SKIN GRAFT  2012  . US ECHOCARDIOGRAPHY  10/14/2007   mild mitral annular ca+,EF 55%,AOV mildly sclerotic    Social History   Socioeconomic History  . Marital status: Married    Spouse name: Not on file  . Number of children: 4  . Years of education: 28  . Highest education level: Some college, no degree  Occupational History  . Occupation: retired  Tobacco Use  . Smoking status: Former Smoker    Packs/day: 3.00    Years: 20.00    Pack years: 60.00    Quit date: 12/19/1974    Years since quitting: 45.7  . Smokeless tobacco: Never Used  Vaping Use  . Vaping Use: Never used  Substance and Sexual Activity  . Alcohol use: No  . Drug use: No  . Sexual activity: Not Currently  Other Topics Concern  . Not on file  Social History Narrative   Right handed    Lives in a one story home    Social Determinants of Health   Financial Resource Strain: Not on file  Food Insecurity: Not  on file  Transportation Needs: Not on file  Physical Activity: Not on file  Stress: Not on file  Social Connections: Not on file  Intimate Partner Violence: Not on file   Family History  Problem Relation Age of Onset  . Pneumonia Mother   . Multiple sclerosis Father   . Lung cancer Brother       VITAL SIGNS BP 126/62   Pulse 70   Temp 98 F (36.7 C)   Resp 20   Ht 5\' 7"  (1.702 m)   Wt 160 lb (72.6 kg)   SpO2 100%   BMI 25.06 kg/m   Outpatient Encounter Medications as of 08/24/2020  Medication Sig  . acetaminophen (TYLENOL) 325 MG tablet Take 650 mg by mouth every 6 (six) hours as needed.  . Amino Acids-Protein Hydrolys (PRO-STAT 64 PO) Take 30 mLs by mouth 3 (three) times daily with meals.  . divalproex (DEPAKOTE) 250 MG DR  tablet Take 250 mg by mouth 2 (two) times daily.  Marland Kitchen escitalopram (LEXAPRO) 20 MG tablet Take 1 tablet in AM, 1/2 tablet at night  . feeding supplement (ENSURE ENLIVE / ENSURE PLUS) LIQD Take 237 mLs by mouth 3 (three) times daily with meals.  . finasteride (PROSCAR) 5 MG tablet Take 5 mg by mouth in the morning.   Marland Kitchen LORazepam (ATIVAN) 0.5 MG tablet Take 1 tablet (0.5 mg total) by mouth at bedtime.  . melatonin 3 MG TABS tablet Take 6 mg by mouth at bedtime.  . NON FORMULARY Diet: Mechanical soft Special Instructions: no dairy products  (Allergy)  . QUEtiapine (SEROQUEL) 25 MG tablet Take 25 mg by mouth at bedtime.  . tamsulosin (FLOMAX) 0.4 MG CAPS capsule Take 1 capsule (0.4 mg total) by mouth daily.   No facility-administered encounter medications on file as of 08/24/2020.     SIGNIFICANT DIAGNOSTIC EXAMS   LABS REVIEWED PREVIOUS   08-10-20: wbc 6.7; hgb 14.3; hct 45.1 mcv 91.5 plt 155; glucose 130; bun 24; creat 0.92; k+ 3.7; na++ 142; ca 8.8 GFR >60; total bili 1.3 albumin 3.5 depakote 10   NO NEW LABS.   Review of Systems  Unable to perform ROS: Dementia (unable to participate )    Physical Exam Constitutional:      General: He is not in acute distress.    Appearance: He is well-developed and well-nourished. He is not diaphoretic.  Neck:     Thyroid: No thyromegaly.  Cardiovascular:     Rate and Rhythm: Normal rate and regular rhythm.     Pulses: Normal pulses and intact distal pulses.     Heart sounds: Normal heart sounds.  Pulmonary:     Effort: Pulmonary effort is normal. No respiratory distress.     Breath sounds: Normal breath sounds.  Abdominal:     General: Bowel sounds are normal. There is no distension.     Palpations: Abdomen is soft.     Tenderness: There is no abdominal tenderness.  Musculoskeletal:        General: No edema.     Cervical back: Neck supple.     Right lower leg: No edema.     Left lower leg: No edema.     Comments: Is able to move all  extremities   Lymphadenopathy:     Cervical: No cervical adenopathy.  Skin:    General: Skin is warm and dry.  Psychiatric:        Mood and Affect: Mood and affect normal.  Speech: He is noncommunicative.     ASSESSMENT/ PLAN:  TODAY  1. Advanced dementia 2. Aortic atherosclerosis 3. Psychosis in elderly with behavioral disturbance  Will continue current medications Will continue current plan of care Will continue to monitor his status.  Will continue to be followed by hospice care.   MD is aware of resident's narcotic use and is in agreement with current plan of care. We will attempt to wean resident as appropriate.  Ok Edwards NP [facility]  Contact [phone] Monday through Friday 8am- 5pm  After hours call [phone]

## 2020-09-05 DEATH — deceased

## 2020-10-06 DEATH — deceased

## 2020-12-08 ENCOUNTER — Ambulatory Visit: Payer: Medicare Other | Admitting: Neurology
